# Patient Record
Sex: Male | Born: 1965 | Race: White | Hispanic: No | Marital: Married | Smoking: Never smoker
Health system: Southern US, Community
[De-identification: ages and names within clinical notes are randomized; demographics above are authoritative.]

## PROBLEM LIST (undated history)

## (undated) DIAGNOSIS — J40 Bronchitis, not specified as acute or chronic: Secondary | ICD-10-CM

## (undated) DIAGNOSIS — C801 Malignant (primary) neoplasm, unspecified: Secondary | ICD-10-CM

## (undated) DIAGNOSIS — K449 Diaphragmatic hernia without obstruction or gangrene: Secondary | ICD-10-CM

## (undated) DIAGNOSIS — D214 Benign neoplasm of connective and other soft tissue of abdomen: Secondary | ICD-10-CM

## (undated) HISTORY — DX: Diaphragmatic hernia without obstruction or gangrene: K44.9

## (undated) HISTORY — DX: Benign neoplasm of connective and other soft tissue of abdomen: D21.4

## (undated) HISTORY — PX: SHOULDER SURGERY: SHX246

## (undated) HISTORY — PX: WISDOM TOOTH EXTRACTION: SHX21

---

## 2004-11-16 ENCOUNTER — Ambulatory Visit: Payer: Self-pay | Admitting: Internal Medicine

## 2004-11-22 ENCOUNTER — Ambulatory Visit (HOSPITAL_COMMUNITY): Admission: RE | Admit: 2004-11-22 | Discharge: 2004-11-22 | Payer: Self-pay | Admitting: Internal Medicine

## 2004-11-24 ENCOUNTER — Ambulatory Visit: Payer: Self-pay | Admitting: Internal Medicine

## 2005-02-06 ENCOUNTER — Ambulatory Visit: Payer: Self-pay | Admitting: Gastroenterology

## 2005-02-07 ENCOUNTER — Ambulatory Visit: Payer: Self-pay | Admitting: Gastroenterology

## 2005-02-07 ENCOUNTER — Encounter (INDEPENDENT_AMBULATORY_CARE_PROVIDER_SITE_OTHER): Payer: Self-pay | Admitting: Specialist

## 2005-03-07 ENCOUNTER — Ambulatory Visit (HOSPITAL_COMMUNITY): Admission: RE | Admit: 2005-03-07 | Discharge: 2005-03-07 | Payer: Self-pay | Admitting: Gastroenterology

## 2005-03-07 ENCOUNTER — Ambulatory Visit: Payer: Self-pay | Admitting: Gastroenterology

## 2005-03-07 ENCOUNTER — Encounter (INDEPENDENT_AMBULATORY_CARE_PROVIDER_SITE_OTHER): Payer: Self-pay | Admitting: Specialist

## 2008-07-14 ENCOUNTER — Telehealth (INDEPENDENT_AMBULATORY_CARE_PROVIDER_SITE_OTHER): Payer: Self-pay | Admitting: *Deleted

## 2008-07-22 ENCOUNTER — Ambulatory Visit: Payer: Self-pay | Admitting: Internal Medicine

## 2008-07-22 DIAGNOSIS — M79609 Pain in unspecified limb: Secondary | ICD-10-CM

## 2008-07-22 DIAGNOSIS — M25519 Pain in unspecified shoulder: Secondary | ICD-10-CM | POA: Insufficient documentation

## 2008-07-22 DIAGNOSIS — R21 Rash and other nonspecific skin eruption: Secondary | ICD-10-CM

## 2008-07-22 DIAGNOSIS — D485 Neoplasm of uncertain behavior of skin: Secondary | ICD-10-CM

## 2008-07-27 ENCOUNTER — Encounter (INDEPENDENT_AMBULATORY_CARE_PROVIDER_SITE_OTHER): Payer: Self-pay | Admitting: *Deleted

## 2008-08-17 ENCOUNTER — Ambulatory Visit: Payer: Self-pay | Admitting: Internal Medicine

## 2008-08-17 ENCOUNTER — Encounter: Payer: Self-pay | Admitting: Internal Medicine

## 2008-10-22 ENCOUNTER — Ambulatory Visit: Payer: Self-pay | Admitting: Internal Medicine

## 2009-10-17 ENCOUNTER — Encounter: Payer: Self-pay | Admitting: Internal Medicine

## 2009-11-17 ENCOUNTER — Emergency Department (HOSPITAL_COMMUNITY): Admission: EM | Admit: 2009-11-17 | Discharge: 2009-11-18 | Payer: Self-pay | Admitting: Emergency Medicine

## 2010-03-06 ENCOUNTER — Ambulatory Visit: Payer: Self-pay | Admitting: Internal Medicine

## 2010-03-06 LAB — CONVERTED CEMR LAB
Albumin: 4.2 g/dL (ref 3.5–5.2)
BUN: 13 mg/dL (ref 6–23)
Basophils Relative: 0.3 % (ref 0.0–3.0)
Bilirubin, Direct: 0.1 mg/dL (ref 0.0–0.3)
CO2: 29 meq/L (ref 19–32)
Calcium: 9.3 mg/dL (ref 8.4–10.5)
Eosinophils Absolute: 0.1 10*3/uL (ref 0.0–0.7)
GFR calc non Af Amer: 90.1 mL/min (ref >60)
Glucose, Bld: 82 mg/dL (ref 70–99)
HCT: 43.2 % (ref 39.0–52.0)
HDL: 47.1 mg/dL (ref >39.00)
Ketones, ur: NEGATIVE mg/dL
MCHC: 35.6 g/dL (ref 30.0–36.0)
MCV: 93.2 fL (ref 78.0–100.0)
Monocytes Absolute: 0.6 10*3/uL (ref 0.1–1.0)
Neutrophils Relative %: 60.6 % (ref 43.0–77.0)
Nitrite: NEGATIVE
RBC: 4.63 M/uL (ref 4.22–5.81)
RDW: 13.9 % (ref 11.5–14.6)
Sodium: 137 meq/L (ref 135–145)
Specific Gravity, Urine: 1.01 (ref 1.000–1.030)
TSH: 3.45 microintl units/mL (ref 0.35–5.50)
Total Bilirubin: 0.7 mg/dL (ref 0.3–1.2)
Total CHOL/HDL Ratio: 4
Total Protein: 6.9 g/dL (ref 6.0–8.3)
Urine Glucose: NEGATIVE mg/dL
Urobilinogen, UA: 0.2 (ref 0.0–1.0)

## 2010-03-15 ENCOUNTER — Encounter: Payer: Self-pay | Admitting: Internal Medicine

## 2010-03-15 ENCOUNTER — Ambulatory Visit: Payer: Self-pay | Admitting: Internal Medicine

## 2010-03-15 DIAGNOSIS — R972 Elevated prostate specific antigen [PSA]: Secondary | ICD-10-CM

## 2010-04-30 LAB — CONVERTED CEMR LAB
ALT: 18 units/L (ref 0–53)
AST: 23 units/L (ref 0–37)
Alkaline Phosphatase: 62 units/L (ref 39–117)
Basophils Relative: 0.6 % (ref 0.0–3.0)
Bilirubin Urine: NEGATIVE
Bilirubin, Direct: 0.2 mg/dL (ref 0.0–0.3)
Eosinophils Absolute: 0.1 10*3/uL (ref 0.0–0.7)
Eosinophils Relative: 1.1 % (ref 0.0–5.0)
GFR calc non Af Amer: 87.68 mL/min (ref >60)
Glucose, Bld: 102 mg/dL — ABNORMAL HIGH (ref 70–99)
HDL: 36.3 mg/dL — ABNORMAL LOW (ref >39.00)
Hemoglobin, Urine: NEGATIVE
Lymphs Abs: 1.9 10*3/uL (ref 0.7–4.0)
MCHC: 34.6 g/dL (ref 30.0–36.0)
MCV: 89.8 fL (ref 78.0–100.0)
Monocytes Relative: 10.3 % (ref 3.0–12.0)
Neutro Abs: 3.3 10*3/uL (ref 1.4–7.7)
Neutrophils Relative %: 56.1 % (ref 43.0–77.0)
Potassium: 5.1 meq/L (ref 3.5–5.1)
RBC: 5.09 M/uL (ref 4.22–5.81)
Sodium: 142 meq/L (ref 135–145)
Specific Gravity, Urine: 1.02 (ref 1.000–1.030)
Total Bilirubin: 0.9 mg/dL (ref 0.3–1.2)
Total Protein, Urine: NEGATIVE mg/dL
Triglycerides: 110 mg/dL (ref 0.0–149.0)
Urine Glucose: NEGATIVE mg/dL

## 2010-05-02 NOTE — Letter (Signed)
Summary: Primary Care Appointment Letter  [facility]  [address] [place], [place] [address]   Phone: [phone]  Fax: [phone]    10/17/2009 MRN: [id number]  Sharrieff Ortner [address] [place], [place]  [address]  Dear Mr. Kyung Rudd,   Your Primary Care Physician Tresa Garter MD has indicated that:    ___X____it is time to schedule an appointment for your physical with Dr. Posey Rea. Please call the office to schedule.    _______you missed your appointment on______ and need to call and          reschedule.    _______you need to have lab work done.    _______you need to schedule an appointment discuss lab or test results.    _______you need to call to reschedule your appointment that is                       scheduled on _________.     Please call our office as soon as possible. Our phone number is [phone]. Please press option 1. Our office is open 8a-12noon and 1p-5p, Monday through Friday.     Thank you,    [facility]

## 2010-05-04 NOTE — Assessment & Plan Note (Signed)
Summary: Cpx/Bcbs/#/cd   Vital Signs:  Patient profile:   45 year old male Height:      72 inches Weight:      186 pounds BMI:     25.32 Temp:     98.7 degrees F oral Pulse rate:   72 / minute Pulse rhythm:   regular Resp:     16 per minute BP sitting:   110 / 80  (left arm) Cuff size:   regular  Vitals Entered By: Lanier Prude, Beverly Gust) (March 15, 2010 2:18 PM) CC: CPX Is Patient Diabetic? No Comments pt is not using Topicort or Ibuprofen   CC:  CPX.  History of Present Illness: The patient presents for a preventive health examination   Current Medications (verified): 1)  Vitamin D3 1000 Unit  Tabs (Cholecalciferol) .Marland Kitchen.. 1 By Mouth Daily 2)  Topicort 0.25 % Crea (Desoximetasone) .... Use Tid 3)  Ibuprofen 600 Mg  Tabs (Ibuprofen) .Marland Kitchen.. 1 By Mouth Two Times A Day Pc As Needed Pain 4)  Aleve 220 Mg Tabs (Naproxen Sodium) .... As Needed  Allergies (verified): No Known Drug Allergies  Past History:  Family History: Last updated: 07/22/2008 F prostate ca  Past Medical History: R shoulder pain - resolved post surgery  Past Surgical History: R shoulder surgery 2010 Dr Rennis Chris  Social History: Occupation: Advice worker Married (separated) 2 daughters 74 and 21 Never Smoked Alcohol use-yes Regular exercise-yes  Review of Systems  The patient denies anorexia, fever, weight loss, weight gain, vision loss, decreased hearing, hoarseness, chest pain, syncope, dyspnea on exertion, peripheral edema, prolonged cough, headaches, hemoptysis, abdominal pain, melena, hematochezia, severe indigestion/heartburn, hematuria, incontinence, genital sores, muscle weakness, suspicious skin lesions, transient blindness, difficulty walking, depression, unusual weight change, abnormal bleeding, enlarged lymph nodes, angioedema, and breast masses.    Physical Exam  General:  Well-developed,well-nourished,in no acute distress; alert,appropriate and cooperative throughout  examination Head:  Normocephalic and atraumatic without obvious abnormalities. No apparent alopecia or balding. Eyes:  No corneal or conjunctival inflammation noted. EOMI. Perrla. Funduscopic exam benign, without hemorrhages, exudates or papilledema. Vision grossly normal. Ears:  External ear exam shows no significant lesions or deformities.  Otoscopic examination reveals clear canals, tympanic membranes are intact bilaterally without bulging, retraction, inflammation or discharge. Hearing is grossly normal bilaterally. Nose:  External nasal examination shows no deformity or inflammation. Nasal mucosa are pink and moist without lesions or exudates. Mouth:  Oral mucosa and oropharynx without lesions or exudates.  Teeth in good repair. Neck:  No deformities, masses, or tenderness noted. Lungs:  Normal respiratory effort, chest expands symmetrically. Lungs are clear to auscultation, no crackles or wheezes. Heart:  Normal rate and regular rhythm. S1 and S2 normal without gallop, murmur, click, rub or other extra sounds. Abdomen:  Bowel sounds positive,abdomen soft and non-tender without masses, organomegaly or hernias noted. Genitalia:  Testes bilaterally descended without nodularity, tenderness or masses. No scrotal masses or lesions. No penis lesions or urethral discharge. Msk:  No deformity or scoliosis noted of thoracic or lumbar spine.   Pulses:  R and L carotid,radial,femoral,dorsalis pedis and posterior tibial pulses are full and equal bilaterally Extremities:  No clubbing, cyanosis, edema, or deformity noted with normal full range of motion of all joints.   Neurologic:  No cranial nerve deficits noted. Station and gait are normal. Plantar reflexes are down-going bilaterally. DTRs are symmetrical throughout. Sensory, motor and coordinative functions appear intact. Skin:  Intact without suspicious lesions or rashes Cervical Nodes:  No lymphadenopathy noted Inguinal  Nodes:  No significant  adenopathy Psych:  Cognition and judgment appear intact. Alert and cooperative with normal attention span and concentration. No apparent delusions, illusions, hallucinations   Impression & Recommendations:  Problem # 1:  WELL ADULT EXAM (ICD-V70.0) Assessment New Health and age related issues were discussed. Available screening tests and vaccinations were discussed as well. Healthy life style including good diet and exercise was discussed.  The labs were reviewed with the patient.  Orders: EKG w/ Interpretation (93000)  Problem # 2:  PSA, INCREASED (ICD-790.93) Assessment: New See "Patient Instructions".   Problem # 3:  SKIN RASH (ICD-782.1) Assessment: Comment Only  His updated medication list for this problem includes:    Topicort 0.25 % Crea (Desoximetasone) ..... Use tid  Complete Medication List: 1)  Vitamin D3 1000 Unit Tabs (Cholecalciferol) .Marland Kitchen.. 1 by mouth daily 2)  Topicort 0.25 % Crea (Desoximetasone) .... Use tid 3)  Ibuprofen 600 Mg Tabs (Ibuprofen) .Marland Kitchen.. 1 by mouth two times a day pc as needed pain 4)  Aleve 220 Mg Tabs (Naproxen sodium) .... As needed  Other Orders: Tdap => 46yrs IM (09323) Admin 1st Vaccine (55732)  Patient Instructions: 1)  Please schedule a follow-up appointment in 1 year 2)  PSA and free PSA 790.93   Orders Added: 1)  EKG w/ Interpretation [93000] 2)  Tdap => 49yrs IM [90715] 3)  Admin 1st Vaccine [90471] 4)  Est. Patient 40-64 years [20254]   Immunizations Administered:  Tetanus Vaccine:    Vaccine Type: Tdap    Site: left deltoid    Mfr: GlaxoSmithKline    Dose: 0.5 ml    Route: IM    Given by: Ami Bullins CMA    Exp. Date: 01/20/2012    Lot #: [id number]    VIS given: 02/18/08 version given March 15, 2010.   Immunizations Administered:  Tetanus Vaccine:    Vaccine Type: Tdap    Site: left deltoid    Mfr: GlaxoSmithKline    Dose: 0.5 ml    Route: IM    Given by: Ami Bullins CMA    Exp. Date: 01/20/2012    Lot #:  [id number]    VIS given: 02/18/08 version given March 15, 2010.

## 2010-06-16 LAB — DIFFERENTIAL
Lymphocytes Relative: 23 % (ref 12–46)
Lymphs Abs: 2.5 10*3/uL (ref 0.7–4.0)
Monocytes Relative: 10 % (ref 3–12)
Neutro Abs: 7.1 10*3/uL (ref 1.7–7.7)
Neutrophils Relative %: 67 % (ref 43–77)

## 2010-06-16 LAB — POCT I-STAT, CHEM 8
BUN: 14 mg/dL (ref 6–23)
Calcium, Ion: 1.14 mmol/L (ref 1.12–1.32)
Chloride: 100 mEq/L (ref 96–112)
Creatinine, Ser: 1.1 mg/dL (ref 0.4–1.5)
Hemoglobin: 15.6 g/dL (ref 13.0–17.0)
Potassium: 3.6 mEq/L (ref 3.5–5.1)

## 2010-06-16 LAB — CBC
MCH: 32.3 pg (ref 26.0–34.0)
MCHC: 34.9 g/dL (ref 30.0–36.0)
MCV: 92.5 fL (ref 78.0–100.0)
Platelets: 221 10*3/uL (ref 150–400)

## 2010-06-16 LAB — POCT CARDIAC MARKERS
CKMB, poc: <1 ng/mL — ABNORMAL LOW (ref 1.0–8.0)
Troponin i, poc: <0.05 ng/mL (ref 0.00–0.09)

## 2010-08-21 ENCOUNTER — Telehealth: Payer: Self-pay | Admitting: Gastroenterology

## 2010-08-21 ENCOUNTER — Encounter: Payer: Self-pay | Admitting: Gastroenterology

## 2010-08-21 NOTE — Telephone Encounter (Signed)
Pt was told he would need to make an appt to see Dr Russella Dar to discuss.  He was transferred to the schedulers to schedule

## 2010-09-11 ENCOUNTER — Other Ambulatory Visit: Payer: Self-pay

## 2010-09-11 ENCOUNTER — Other Ambulatory Visit: Payer: Self-pay | Admitting: Internal Medicine

## 2010-09-11 DIAGNOSIS — R972 Elevated prostate specific antigen [PSA]: Secondary | ICD-10-CM

## 2010-09-20 ENCOUNTER — Encounter: Payer: Self-pay | Admitting: Gastroenterology

## 2010-09-27 ENCOUNTER — Ambulatory Visit: Payer: Self-pay | Admitting: Gastroenterology

## 2010-11-06 ENCOUNTER — Encounter: Payer: Self-pay | Admitting: Gastroenterology

## 2010-11-07 ENCOUNTER — Encounter: Payer: Self-pay | Admitting: Gastroenterology

## 2010-11-07 ENCOUNTER — Ambulatory Visit (INDEPENDENT_AMBULATORY_CARE_PROVIDER_SITE_OTHER): Payer: PRIVATE HEALTH INSURANCE | Admitting: Gastroenterology

## 2010-11-07 VITALS — BP 110/80 | HR 60 | Ht 72.0 in | Wt 180.8 lb

## 2010-11-07 DIAGNOSIS — R933 Abnormal findings on diagnostic imaging of other parts of digestive tract: Secondary | ICD-10-CM

## 2010-11-07 NOTE — Patient Instructions (Signed)
Needs upper EUS, 60 min, radial +/- linear, next available [facility] time, +propofol (needed a lot of meds at time of last EUS). A copy of this information will be made available to Dr. Posey Rea.

## 2010-11-07 NOTE — Progress Notes (Signed)
Review of pertinent gastrointestinal problems: 1. 2.5cm by 1.1cm subepith lesion in gastric cardia; EUS 12.2006 after incidentally noted by Dr. Russella Dar one month prior.  FNA showed spindle cells, was recommended for repeat EUS at 12 month interval.   HPI: This is a  very pleasant 45 year old man whom I last saw in 2006. He was recommended to have a repeat endoscopic ultrasound at 12 month interval but he never got around to scheduling that are calling us back.  Has been well since 2006, no serious new diagnoses.  No dysphagia.  Overall a small amount of weight loss, not really intentional.  Some new stresses.  Separating with wife.  No post prandial problems.  No GERD smptoms   Review of systems: Pertinent positive and negative review of systems were noted in the above HPI section.  All other review of systems was otherwise negative.   Past Medical History  Diagnosis Date  . Hiatal hernia   . GIST (gastrointestinal stromal tumor), non-malignant     Past Surgical History  Procedure Date  . Shoulder surgery      reports that he has never smoked. He has never used smokeless tobacco. He reports that he drinks alcohol. He reports that he does not use illicit drugs.  family history includes Colon cancer in his paternal grandfather and Prostate cancer in an unspecified family member.    Current Medications, Allergies were all reviewed with the patient via [facility] electronic medical record system.    Physical Exam: BP 110/80  Pulse 60  Ht 6' (1.829 m)  Wt 180 lb 12.8 oz (82.01 kg)  BMI 24.52 kg/m2 Constitutional: generally well-appearing Psychiatric: alert and oriented x3 Eyes: extraocular movements intact Mouth: oral pharynx moist, no lesions Neck: supple no lymphadenopathy Cardiovascular: heart regular rate and rhythm Lungs: clear to auscultation bilaterally Abdomen: soft, nontender, nondistended, no obvious ascites, no peritoneal signs, normal bowel  sounds Extremities: no lower extremity edema bilaterally Skin: no lesions on visible extremities    Assessment and plan: 45 y.o. male with 2006 sub-mucosal lesion in proximal stomach  We will set up repeat endoscopic ultrasound with propofol sedation to reevaluate the submucosal lesion in his proximal stomach. It has been about 5 years since it was last Evaluated. If there is been no significant changes in size then I do not think it should be followed any further.

## 2010-11-20 ENCOUNTER — Telehealth: Payer: Self-pay

## 2010-11-20 NOTE — Telephone Encounter (Signed)
Pt aware of the date change and has been reinstructed

## 2010-12-28 ENCOUNTER — Encounter: Payer: PRIVATE HEALTH INSURANCE | Admitting: Gastroenterology

## 2010-12-28 ENCOUNTER — Telehealth: Payer: Self-pay

## 2010-12-28 ENCOUNTER — Ambulatory Visit (HOSPITAL_COMMUNITY)
Admission: RE | Admit: 2010-12-28 | Discharge: 2010-12-28 | Disposition: A | Discharge status: Home / Self Care | Payer: PRIVATE HEALTH INSURANCE | Source: Ambulatory Visit | Attending: Gastroenterology | Admitting: Gastroenterology

## 2010-12-28 DIAGNOSIS — K319 Disease of stomach and duodenum, unspecified: Secondary | ICD-10-CM | POA: Insufficient documentation

## 2010-12-28 DIAGNOSIS — R933 Abnormal findings on diagnostic imaging of other parts of digestive tract: Secondary | ICD-10-CM

## 2010-12-28 NOTE — Telephone Encounter (Signed)
Message copied by Donata Duff on Thu Dec 28, 2010  9:48 AM ------      Message from: Rob Bunting P      Created: Thu Dec 28, 2010  9:40 AM       He needs repeat upper EUS in 3 years,            Thanks

## 2010-12-28 NOTE — Telephone Encounter (Signed)
Recall in EPIC 

## 2012-07-04 ENCOUNTER — Ambulatory Visit (INDEPENDENT_AMBULATORY_CARE_PROVIDER_SITE_OTHER): Payer: PRIVATE HEALTH INSURANCE | Admitting: Internal Medicine

## 2012-07-04 ENCOUNTER — Other Ambulatory Visit (INDEPENDENT_AMBULATORY_CARE_PROVIDER_SITE_OTHER): Payer: PRIVATE HEALTH INSURANCE

## 2012-07-04 ENCOUNTER — Encounter: Payer: Self-pay | Admitting: Internal Medicine

## 2012-07-04 VITALS — BP 106/70 | HR 72 | Temp 98.2°F | Resp 16 | Ht 72.0 in | Wt 192.0 lb

## 2012-07-04 DIAGNOSIS — R221 Localized swelling, mass and lump, neck: Secondary | ICD-10-CM

## 2012-07-04 DIAGNOSIS — R972 Elevated prostate specific antigen [PSA]: Secondary | ICD-10-CM

## 2012-07-04 DIAGNOSIS — Z0000 Encounter for general adult medical examination without abnormal findings: Secondary | ICD-10-CM

## 2012-07-04 LAB — CBC WITH DIFFERENTIAL/PLATELET
Basophils Relative: 0.1 % (ref 0.0–3.0)
Eosinophils Absolute: 0.1 10*3/uL (ref 0.0–0.7)
Eosinophils Relative: 1.2 % (ref 0.0–5.0)
Hemoglobin: 14.7 g/dL (ref 13.0–17.0)
Lymphocytes Relative: 36.8 % (ref 12.0–46.0)
Monocytes Absolute: 0.5 10*3/uL (ref 0.1–1.0)
Monocytes Relative: 10.3 % (ref 3.0–12.0)
RBC: 4.66 Mil/uL (ref 4.22–5.81)
RDW: 12 % (ref 11.5–14.6)
WBC: 5.1 10*3/uL (ref 4.5–10.5)

## 2012-07-04 LAB — URINALYSIS
Bilirubin Urine: NEGATIVE
Hgb urine dipstick: NEGATIVE
Leukocytes, UA: NEGATIVE
Nitrite: NEGATIVE
Specific Gravity, Urine: <=1.005 (ref 1.000–1.030)

## 2012-07-04 LAB — BASIC METABOLIC PANEL
Calcium: 8.8 mg/dL (ref 8.4–10.5)
Glucose, Bld: 87 mg/dL (ref 70–99)
Sodium: 136 mEq/L (ref 135–145)

## 2012-07-04 LAB — HEPATIC FUNCTION PANEL: Total Bilirubin: 0.4 mg/dL (ref 0.3–1.2)

## 2012-07-04 NOTE — Progress Notes (Signed)
  Subjective:    Patient ID: Aaron Sharp, male    DOB: December 31, 1965, 47 y.o.   MRN: [id number]  HPI  The patient is here for a wellness exam. The patient has been doing well overall without major physical or psychological issues going on lately. C/o R shoulder pain and a lump on L neck x 3 years  Review of Systems  Constitutional: Negative for appetite change, fatigue and unexpected weight change.  HENT: Negative for nosebleeds, congestion, sore throat, sneezing, trouble swallowing and neck pain.   Eyes: Negative for itching and visual disturbance.  Respiratory: Negative for cough.   Cardiovascular: Negative for chest pain, palpitations and leg swelling.  Gastrointestinal: Negative for nausea, diarrhea, blood in stool and abdominal distention.  Genitourinary: Negative for frequency and hematuria.  Musculoskeletal: Negative for back pain, joint swelling and gait problem.  Skin: Negative for rash.  Neurological: Negative for dizziness, tremors, speech difficulty and weakness.  Psychiatric/Behavioral: Negative for sleep disturbance, dysphoric mood and agitation. The patient is not nervous/anxious.        Objective:   Physical Exam  Constitutional: He is oriented to person, place, and time. He appears well-developed.  HENT:  Mouth/Throat: Oropharynx is clear and moist.  Eyes: Conjunctivae are normal. Pupils are equal, round, and reactive to light.  Neck: Normal range of motion. No JVD present. No thyromegaly present.  Cardiovascular: Normal rate, regular rhythm, normal heart sounds and intact distal pulses.  Exam reveals no gallop and no friction rub.   No murmur heard. Pulmonary/Chest: Effort normal and breath sounds normal. No respiratory distress. He has no wheezes. He has no rales. He exhibits no tenderness.  Abdominal: Soft. Bowel sounds are normal. He exhibits no distension and no mass. There is no tenderness. There is no rebound and no guarding.  Musculoskeletal: Normal range of  motion. He exhibits tenderness (B shoulders NT; R trap w/a couple tender spots). He exhibits no edema.  Lymphadenopathy:    He has no cervical adenopathy.  Neurological: He is alert and oriented to person, place, and time. He has normal reflexes. No cranial nerve deficit. He exhibits normal muscle tone. Coordination normal.  Skin: Skin is warm and dry. No rash noted.  L lat neck w/oval flat lump over mid SKM 1.7x1.2 cm NT  Psychiatric: He has a normal mood and affect. His behavior is normal. Judgment and thought content normal.     Procedure Note :    Procedure :   Point of care (POC) sonography examination   Indication: L neck mass   Equipment used: Sonosite M-Turbo with HFL38x/13-6 MHz transducer linear probe. The images were stored in the unit and later transferred in storage.  The patient was placed in a decubitus position.  This study revealed no abnormal findings  Impression: no abnormal findings        Assessment & Plan:

## 2012-07-04 NOTE — Assessment & Plan Note (Signed)
Declined PSA 

## 2012-07-04 NOTE — Assessment & Plan Note (Signed)
POC [place] CT

## 2012-07-05 NOTE — Assessment & Plan Note (Signed)
We discussed age appropriate health related issues, including available/recomended screening tests and vaccinations. We discussed a need for adhering to healthy diet and exercise. Labs/EKG were reviewed/ordered. All questions were answered.   

## 2012-07-09 ENCOUNTER — Ambulatory Visit (INDEPENDENT_AMBULATORY_CARE_PROVIDER_SITE_OTHER)
Admission: RE | Admit: 2012-07-09 | Discharge: 2012-07-09 | Disposition: A | Discharge status: Home / Self Care | Payer: PRIVATE HEALTH INSURANCE | Source: Ambulatory Visit | Attending: Internal Medicine | Admitting: Internal Medicine

## 2012-07-09 DIAGNOSIS — R221 Localized swelling, mass and lump, neck: Secondary | ICD-10-CM

## 2012-07-09 DIAGNOSIS — R220 Localized swelling, mass and lump, head: Secondary | ICD-10-CM

## 2012-07-09 MED ORDER — IOHEXOL 300 MG/ML  SOLN
75.0000 mL | Freq: Once | INTRAMUSCULAR | Status: AC | PRN
  Administered 2012-07-09: 75 mL via INTRAVENOUS

## 2012-08-19 ENCOUNTER — Encounter: Payer: Self-pay | Admitting: Internal Medicine

## 2013-02-05 ENCOUNTER — Other Ambulatory Visit: Payer: Self-pay

## 2013-07-10 ENCOUNTER — Encounter: Payer: PRIVATE HEALTH INSURANCE | Admitting: Internal Medicine

## 2013-07-27 ENCOUNTER — Ambulatory Visit (INDEPENDENT_AMBULATORY_CARE_PROVIDER_SITE_OTHER): Payer: PRIVATE HEALTH INSURANCE | Admitting: Internal Medicine

## 2013-07-27 ENCOUNTER — Encounter: Payer: Self-pay | Admitting: Internal Medicine

## 2013-07-27 VITALS — BP 100/66 | HR 65 | Temp 98.3°F | Resp 16 | Ht 72.0 in | Wt 189.4 lb

## 2013-07-27 DIAGNOSIS — Z136 Encounter for screening for cardiovascular disorders: Secondary | ICD-10-CM

## 2013-07-27 DIAGNOSIS — M79609 Pain in unspecified limb: Secondary | ICD-10-CM

## 2013-07-27 DIAGNOSIS — M25519 Pain in unspecified shoulder: Secondary | ICD-10-CM

## 2013-07-27 DIAGNOSIS — Z0000 Encounter for general adult medical examination without abnormal findings: Secondary | ICD-10-CM

## 2013-07-27 MED ORDER — VITAMIN D 1000 UNITS PO TABS: 1000.0000 [IU] | ORAL_TABLET | Freq: Every day | ORAL | Status: AC

## 2013-07-27 MED ORDER — METHYLPREDNISOLONE ACETATE 40 MG/ML IJ SUSP
40.0000 mg | Freq: Once | INTRAMUSCULAR | Status: AC
  Administered 2013-07-27: 40 mg via INTRA_ARTICULAR

## 2013-07-27 MED ORDER — METHYLPREDNISOLONE ACETATE 40 MG/ML IJ SUSP: 40.0000 mg | Freq: Once | INTRAMUSCULAR | Status: DC

## 2013-07-27 NOTE — Progress Notes (Signed)
Subjective:    HPI  The patient is here for a wellness exam. The patient has been doing well overall without major physical or psychological issues going on lately C/o ongoing R shoulder pain  Wt Readings from Last 3 Encounters:  07/27/13 189 lb 6 oz (85.9 kg)  07/04/12 192 lb (87.091 kg)  11/07/10 180 lb 12.8 oz (82.01 kg)   BP Readings from Last 3 Encounters:  07/27/13 100/66  07/04/12 106/70  11/07/10 110/80       .Review of Systems  Constitutional: Negative for appetite change, fatigue and unexpected weight change.  HENT: Negative for congestion, nosebleeds, sneezing, sore throat and trouble swallowing.   Eyes: Negative for itching and visual disturbance.  Respiratory: Negative for cough.   Cardiovascular: Negative for chest pain, palpitations and leg swelling.  Gastrointestinal: Negative for nausea, diarrhea, blood in stool and abdominal distention.  Genitourinary: Negative for frequency and hematuria.  Musculoskeletal: Negative for back pain, gait problem, joint swelling and neck pain.  Skin: Negative for rash.  Neurological: Negative for dizziness, tremors, speech difficulty and weakness.  Psychiatric/Behavioral: Negative for sleep disturbance, dysphoric mood and agitation. The patient is not nervous/anxious.        Objective:   Physical Exam  Constitutional: He is oriented to person, place, and time. He appears well-developed.  HENT:  Mouth/Throat: Oropharynx is clear and moist.  Eyes: Conjunctivae are normal. Pupils are equal, round, and reactive to light.  Neck: Normal range of motion. No JVD present. No thyromegaly present.  Cardiovascular: Normal rate, regular rhythm, normal heart sounds and intact distal pulses.  Exam reveals no gallop and no friction rub.   No murmur heard. Pulmonary/Chest: Effort normal and breath sounds normal. No respiratory distress. He has no wheezes. He has no rales. He exhibits no tenderness.  Abdominal: Soft. Bowel sounds are  normal. He exhibits no distension and no mass. There is no tenderness. There is no rebound and no guarding.  Musculoskeletal: Normal range of motion. He exhibits tenderness (B shoulders NT; R trap w/a 3 tender spots posteriorly). He exhibits no edema.  Lymphadenopathy:    He has no cervical adenopathy.  Neurological: He is alert and oriented to person, place, and time. He has normal reflexes. No cranial nerve deficit. He exhibits normal muscle tone. Coordination normal.  Skin: Skin is warm and dry. No rash noted.  L lat neck w/oval flat lump over mid SKM 1.7x1.2 cm NT  Psychiatric: He has a normal mood and affect. His behavior is normal. Judgment and thought content normal.   Lab Results  Component Value Date   WBC 5.1 07/04/2012   HGB 14.7 07/04/2012   HCT 42.2 07/04/2012   PLT 162.0 07/04/2012   GLUCOSE 87 07/04/2012   CHOL 183 03/06/2010   TRIG 74.0 03/06/2010   HDL 47.10 03/06/2010   LDLCALC 121* 03/06/2010   ALT 24 07/04/2012   AST 25 07/04/2012   NA 136 07/04/2012   K 4.1 07/04/2012   CL 103 07/04/2012   CREATININE 1.0 07/04/2012   BUN 11 07/04/2012   CO2 27 07/04/2012   TSH 4.48 07/04/2012   PSA 2.91 03/06/2010   INR 1.09 11/17/2009    Procedure Note :    Trigger Point Injection:   Indication : Focal tender area identifiable by the location without other identifiable neurologic or musculoskeletal finding or pathology.   Risks including unsuccessful procedure , bleeding, infection, bruising, skin atrophy and others were explained to the patient in detail as well as  the benefits. Informed consent was obtained and signed.   Tthe patient was placed in a comfortable position. 4   points of maximum tenderness over posterior R shoulder muscles and R trapezius muscles were marked and  the skin was prepped with Betadine and alcohol. 1 inch 25-gauge needle was used. The needle was advanced perpendicular to the skin. Each trigger point was injected with 1 mL of 2% lidocaine and 10 mg of Depo-Medrol in a usual  fashion.  Band-Aids applied.   Tolerated well. Complications: None. Good pain relief following the procedure.      Assessment & Plan:

## 2013-07-27 NOTE — Progress Notes (Signed)
Pre visit review using our clinic review tool, if applicable. No additional management support is needed unless otherwise documented below in the visit note/SLS  

## 2013-07-27 NOTE — Assessment & Plan Note (Signed)
We discussed age appropriate health related issues, including available/recomended screening tests and vaccinations. We discussed a need for adhering to healthy diet and exercise. Labs/EKG were reviewed/ordered. All questions were answered.   

## 2013-08-04 ENCOUNTER — Other Ambulatory Visit: Payer: Self-pay | Admitting: Internal Medicine

## 2013-08-04 ENCOUNTER — Ambulatory Visit (INDEPENDENT_AMBULATORY_CARE_PROVIDER_SITE_OTHER): Payer: PRIVATE HEALTH INSURANCE | Admitting: Family Medicine

## 2013-08-04 ENCOUNTER — Other Ambulatory Visit (INDEPENDENT_AMBULATORY_CARE_PROVIDER_SITE_OTHER): Payer: PRIVATE HEALTH INSURANCE

## 2013-08-04 ENCOUNTER — Encounter: Payer: Self-pay | Admitting: Family Medicine

## 2013-08-04 VITALS — BP 120/82 | HR 55 | Wt 187.0 lb

## 2013-08-04 DIAGNOSIS — Z0000 Encounter for general adult medical examination without abnormal findings: Secondary | ICD-10-CM

## 2013-08-04 DIAGNOSIS — M24111 Other articular cartilage disorders, right shoulder: Secondary | ICD-10-CM | POA: Insufficient documentation

## 2013-08-04 DIAGNOSIS — S43432A Superior glenoid labrum lesion of left shoulder, initial encounter: Secondary | ICD-10-CM

## 2013-08-04 DIAGNOSIS — M25519 Pain in unspecified shoulder: Secondary | ICD-10-CM

## 2013-08-04 DIAGNOSIS — M25511 Pain in right shoulder: Secondary | ICD-10-CM

## 2013-08-04 DIAGNOSIS — S43439A Superior glenoid labrum lesion of unspecified shoulder, initial encounter: Secondary | ICD-10-CM

## 2013-08-04 DIAGNOSIS — R8290 Unspecified abnormal findings in urine: Secondary | ICD-10-CM

## 2013-08-04 LAB — HEPATIC FUNCTION PANEL
ALBUMIN: 4.3 g/dL (ref 3.5–5.2)
ALK PHOS: 55 U/L (ref 39–117)
ALT: 21 U/L (ref 0–53)
AST: 21 U/L (ref 0–37)
BILIRUBIN DIRECT: 0.1 mg/dL (ref 0.0–0.3)
TOTAL PROTEIN: 7.1 g/dL (ref 6.0–8.3)
Total Bilirubin: 0.6 mg/dL (ref 0.2–1.2)

## 2013-08-04 LAB — CBC WITH DIFFERENTIAL/PLATELET
BASOS ABS: 0 10*3/uL (ref 0.0–0.1)
Basophils Relative: 0.4 % (ref 0.0–3.0)
EOS PCT: 1.1 % (ref 0.0–5.0)
Eosinophils Absolute: 0.1 10*3/uL (ref 0.0–0.7)
HCT: 44.7 % (ref 39.0–52.0)
Hemoglobin: 15.4 g/dL (ref 13.0–17.0)
Lymphocytes Relative: 29.2 % (ref 12.0–46.0)
Lymphs Abs: 1.6 10*3/uL (ref 0.7–4.0)
MCHC: 34.4 g/dL (ref 30.0–36.0)
MCV: 93.2 fl (ref 78.0–100.0)
MONO ABS: 0.5 10*3/uL (ref 0.1–1.0)
Monocytes Relative: 8.5 % (ref 3.0–12.0)
NEUTROS PCT: 60.8 % (ref 43.0–77.0)
Neutro Abs: 3.3 10*3/uL (ref 1.4–7.7)
Platelets: 216 10*3/uL (ref 150.0–400.0)
RBC: 4.8 Mil/uL (ref 4.22–5.81)
RDW: 12.4 % (ref 11.5–14.6)
WBC: 5.4 10*3/uL (ref 4.5–10.5)

## 2013-08-04 LAB — BASIC METABOLIC PANEL
BUN: 15 mg/dL (ref 6–23)
CO2: 28 mEq/L (ref 19–32)
CREATININE: 1.2 mg/dL (ref 0.4–1.5)
Calcium: 9.6 mg/dL (ref 8.4–10.5)
Chloride: 103 mEq/L (ref 96–112)
GFR: 71.44 mL/min (ref >60.00)
Glucose, Bld: 86 mg/dL (ref 70–99)
Potassium: 4.6 mEq/L (ref 3.5–5.1)
Sodium: 138 mEq/L (ref 135–145)

## 2013-08-04 LAB — LIPID PANEL
CHOLESTEROL: 192 mg/dL (ref 0–200)
HDL: 46.5 mg/dL (ref >39.00)
LDL Cholesterol: 126 mg/dL — ABNORMAL HIGH (ref 0–99)
TRIGLYCERIDES: 98 mg/dL (ref 0.0–149.0)
Total CHOL/HDL Ratio: 4
VLDL: 19.6 mg/dL (ref 0.0–40.0)

## 2013-08-04 LAB — URINALYSIS, ROUTINE W REFLEX MICROSCOPIC
Bilirubin Urine: NEGATIVE
HGB URINE DIPSTICK: NEGATIVE
Ketones, ur: NEGATIVE
NITRITE: NEGATIVE
PH: 6 (ref 5.0–8.0)
Specific Gravity, Urine: 1.025 (ref 1.000–1.030)
TOTAL PROTEIN, URINE-UPE24: NEGATIVE
URINE GLUCOSE: NEGATIVE
Urobilinogen, UA: 0.2 (ref 0.0–1.0)

## 2013-08-04 LAB — PSA: PSA: 1.74 ng/mL (ref 0.10–4.00)

## 2013-08-04 LAB — TSH: TSH: 4.39 u[IU]/mL (ref 0.35–4.50)

## 2013-08-04 NOTE — Patient Instructions (Signed)
Nice to meet you Ice 20 minutes 2 times a day Exercises 3 times a week.  Increase your Vitamin D to 2000 IU daily.  Watch sleeping position Tennis ball on chair between shoulder blades Monitor position See me in 3 weeks.

## 2013-08-04 NOTE — Progress Notes (Signed)
[facility] [place], [place] [address] Phone: [phone] Subjective:    I'm seeing this patient by the request  of:  Walker Kehr, MD   CC: Right shoulder pain  YSA:[id number] Aaron Sharp is a 48 y.o. male coming in with complaint of right shoulder pain. Patient has had a past medical history significant for rotator cuff repair of the right shoulder. Patient states since the surgery it has never felt right. Patient notices that he has some discomfort whenever he is reaching across his body especially when putting on a seatbelt. Patient describes as more of a dull aching sensation. Patient denies that it stops him from any activity and does not wake him up at night but has noticed that this pain can give him trouble. Patient denies any radiation of the arm or any numbness or weakness. Patient has had significant amount neck pain that is associated with but not at all times. Patient was given trigger point injection by primary care provider that did help with relieving the discomfort by about 50%. Patient with the severity of 5/10.     Past medical history, social, surgical and family history all reviewed in electronic medical record.   Review of Systems: No headache, visual changes, nausea, vomiting, diarrhea, constipation, dizziness, abdominal pain, skin rash, fevers, chills, night sweats, weight loss, swollen lymph nodes, body aches, joint swelling, muscle aches, chest pain, shortness of breath, mood changes.   Objective Blood pressure 120/82, pulse 55, weight 187 lb (84.823 kg), SpO2 98.00%.  General: No apparent distress alert and oriented x3 mood and affect normal, dressed appropriately.  HEENT: Pupils equal, extraocular movements intact  Respiratory: Patient's speak in full sentences and does not appear short of breath  Cardiovascular: No lower extremity edema, non tender, no erythema  Skin: Warm dry intact with no signs of infection or  rash on extremities or on axial skeleton.  Abdomen: Soft nontender  Neuro: Cranial nerves II through XII are intact, neurovascularly intact in all extremities with 2+ DTRs and 2+ pulses.  Lymph: No lymphadenopathy of posterior or anterior cervical chain or axillae bilaterally.  Gait normal with good balance and coordination.  MSK:  Non tender with full range of motion and good stability and symmetric strength and tone of  elbows, wrist, hip, knee and ankles bilaterally.  Shoulder: Right Inspection reveals no abnormalities, atrophy or asymmetry. Palpation is normal with no tenderness over AC joint or bicipital groove. ROM is full in all planes. Rotator cuff strength normal throughout. Mild signs of impingement with positive Neer and Hawkin's tests, but negative empty can sign. Speeds and Yergason's tests normal. labral pathology noted with positive Obrien's, negative clunk and good stability. Normal scapular function observed. No painful arc and no drop arm sign. No apprehension sign Contralateral shoulder unremarkable  Neck: Inspection unremarkable. No palpable stepoffs. Negative Spurling's maneuver. Full neck range of motion Grip strength and sensation normal in bilateral hands Strength good C4 to T1 distribution No sensory change to C4 to T1 Negative Hoffman sign bilaterally Reflexes normal Patient does have some mild trapezius muscle spasm.   MSK US performed of: Right This study was ordered, performed, and interpreted by Charlann Boxer D.O.  Shoulder:   Supraspinatus:  Appears normal on long and transverse views, no bursal bulge seen with shoulder abduction on impingement view. Mild bursitis is noted though. Infraspinatus:  Appears normal on long and transverse views. Subscapularis:  Appears normal on long and transverse views. Teres Minor:  Appears normal on long and transverse views. AC joint:  Capsule undistended, no geyser sign. Glenohumeral Joint:  Appears normal without  effusion. Glenoid Labrum:  Intact without visualized tears. Patient does have postoperative changes with some scar tissue still related. Biceps Tendon:  Appears normal on long and transverse views, no fraying of tendon, tendon located in intertubercular groove, no subluxation with shoulder internal or external rotation. No increased power doppler signal.  Impression: Nonspecific bursitis with scarring of previous labral tear.  Procedure: Real-time Ultrasound Guided Injection of right glenohumeral joint Device: GE Logiq E  Ultrasound guided injection is preferred based studies that show increased duration, increased effect, greater accuracy, decreased procedural pain, increased response rate with ultrasound guided versus blind injection.  Verbal informed consent obtained.  Time-out conducted.  Noted no overlying erythema, induration, or other signs of local infection.  Skin prepped in a sterile fashion.  Local anesthesia: Topical Ethyl chloride.  With sterile technique and under real time ultrasound guidance:  Joint visualized.  23g 1  inch needle inserted posterior approach. Pictures taken for needle placement. Patient did have injection of 2 cc of 1% lidocaine, 2 cc of 0.5% Marcaine, and 1.0 cc of Kenalog 40 mg/dL. Completed without difficulty  Pain immediately resolved suggesting accurate placement of the medication.  Advised to call if fevers/chills, erythema, induration, drainage, or persistent bleeding.  Images permanently stored and available for review in the ultrasound unit.  Impression: Technically successful ultrasound guided injection.      Impression and Recommendations:     This case required medical decision making of moderate complexity.

## 2013-08-04 NOTE — Assessment & Plan Note (Signed)
Patient does have scarring from a labral injury it appears likely secondary to surgery. Patient was given an injection day and tolerated the procedure very well with still mild pain but overall significantly better. We discussed postural exercises and other things that can be beneficial and was given home exercises for the shoulder. We discussed over-the-counter supplementation I can be helpful as well. Patient will try these interventions and come back again in 3 weeks for further evaluation.

## 2013-08-07 ENCOUNTER — Encounter: Payer: Self-pay | Admitting: Internal Medicine

## 2013-08-07 NOTE — Assessment & Plan Note (Signed)
Trigger point inj 

## 2013-08-25 ENCOUNTER — Encounter: Payer: Self-pay | Admitting: Internal Medicine

## 2013-08-25 ENCOUNTER — Ambulatory Visit (INDEPENDENT_AMBULATORY_CARE_PROVIDER_SITE_OTHER): Payer: PRIVATE HEALTH INSURANCE | Admitting: Family Medicine

## 2013-08-25 ENCOUNTER — Other Ambulatory Visit: Payer: PRIVATE HEALTH INSURANCE

## 2013-08-25 ENCOUNTER — Encounter: Payer: Self-pay | Admitting: Family Medicine

## 2013-08-25 VITALS — BP 126/84 | HR 57 | Ht 71.0 in | Wt 187.0 lb

## 2013-08-25 DIAGNOSIS — R8290 Unspecified abnormal findings in urine: Secondary | ICD-10-CM

## 2013-08-25 DIAGNOSIS — M24111 Other articular cartilage disorders, right shoulder: Secondary | ICD-10-CM

## 2013-08-25 DIAGNOSIS — S43439A Superior glenoid labrum lesion of unspecified shoulder, initial encounter: Secondary | ICD-10-CM

## 2013-08-25 LAB — URINALYSIS, ROUTINE W REFLEX MICROSCOPIC
Bilirubin Urine: NEGATIVE
Hgb urine dipstick: NEGATIVE
Ketones, ur: NEGATIVE
NITRITE: NEGATIVE
RBC / HPF: NONE SEEN (ref 0–?)
SPECIFIC GRAVITY, URINE: 1.015 (ref 1.000–1.030)
Total Protein, Urine: NEGATIVE
Urine Glucose: NEGATIVE
Urobilinogen, UA: 0.2 (ref 0.0–1.0)
pH: 6 (ref 5.0–8.0)

## 2013-08-25 MED ORDER — MELOXICAM 15 MG PO TABS: 15.0000 mg | ORAL_TABLET | Freq: Every day | ORAL | Status: DC

## 2013-08-25 NOTE — Progress Notes (Signed)
  [name] [address] [place], [place] [address] Phone: [phone] Subjective:    CC: Right shoulder pain follow up  [id number] Aaron Sharp is a 48 y.o. male coming in with complaint of right shoulder pain. It previously and did have a shoulder bursitis as well as scarring from a previous shoulder surgery for this labrum. Patient was given a corticosteroid injection was given home exercise program. Patient states it is approximately 95% better. Patient states he still has a mild twinge with certain activity. Patient was able to do any activity that he would like. Patient is not doing exercises on a regular basis and has not icing as much as it should. Denies any new symptoms or any neck pain. Overall feeling fairly good.     Past medical history, social, surgical and family history all reviewed in electronic medical record.   Review of Systems: No headache, visual changes, nausea, vomiting, diarrhea, constipation, dizziness, abdominal pain, skin rash, fevers, chills, night sweats, weight loss, swollen lymph nodes, body aches, joint swelling, muscle aches, chest pain, shortness of breath, mood changes.   Objective Blood pressure 126/84, pulse 57, height 5\' 11"  (1.803 m), weight 187 lb (84.823 kg), SpO2 99.00%.  General: No apparent distress alert and oriented x3 mood and affect normal, dressed appropriately.  HEENT: Pupils equal, extraocular movements intact  Respiratory: Patient's speak in full sentences and does not appear short of breath  Cardiovascular: No lower extremity edema, non tender, no erythema  Skin: Warm dry intact with no signs of infection or rash on extremities or on axial skeleton.  Abdomen: Soft nontender  Neuro: Cranial nerves II through XII are intact, neurovascularly intact in all extremities with 2+ DTRs and 2+ pulses.  Lymph: No lymphadenopathy of posterior or anterior cervical chain or axillae bilaterally.  Gait normal with  good balance and coordination.  MSK:  Non tender with full range of motion and good stability and symmetric strength and tone of  elbows, wrist, hip, knee and ankles bilaterally.  Shoulder: Right Inspection reveals no abnormalities, atrophy or asymmetry. Palpation is normal with no tenderness over AC joint or bicipital groove. ROM is full in all planes. Rotator cuff strength normal throughout. Mild signs of impingement with positive Neer and Hawkin's tests, but negative empty can sign. Speeds and Yergason's tests normal. labral pathology noted with positive Obrien's, negative clunk and good stability. Normal scapular function observed. No painful arc and no drop arm sign. No apprehension sign Contralateral shoulder unremarkable  Neck: Inspection unremarkable. No palpable stepoffs. Negative Spurling's maneuver. Full neck range of motion Grip strength and sensation normal in bilateral hands Strength good C4 to T1 distribution No sensory change to C4 to T1 Negative Hoffman sign bilaterally Reflexes normal     Impression and Recommendations:     This case required medical decision making of moderate complexity.

## 2013-08-25 NOTE — Assessment & Plan Note (Signed)
He is doing very well with conservative therapy and after an intra-articular injection. Patient given other home exercises and phase II strengthening that could be beneficial. We discussed doing the icing as well. Patient will follow up again in 3-4 weeks if he continues to have pain. Otherwise at this time as the patient continues to do well he can followup on an as-needed basis.

## 2013-08-25 NOTE — Patient Instructions (Signed)
Good to see you Keep doing what you are doing.  Ice still activity.  Posture on wall heels, butt shoulder and head against wall for total of 5 minutes daily.  Up right rows and bent over rows 3 times a week.  50% weight and increase 10% a week thereafter.  Come back again in 3-4 weeks if not perfect or need manipulation.

## 2013-08-26 LAB — CULTURE, URINE COMPREHENSIVE
Colony Count: NO GROWTH
ORGANISM ID, BACTERIA: NO GROWTH

## 2013-09-01 ENCOUNTER — Encounter: Payer: Self-pay | Admitting: Internal Medicine

## 2013-09-02 ENCOUNTER — Encounter: Payer: Self-pay | Admitting: Family Medicine

## 2013-09-03 ENCOUNTER — Ambulatory Visit (INDEPENDENT_AMBULATORY_CARE_PROVIDER_SITE_OTHER): Payer: PRIVATE HEALTH INSURANCE | Admitting: Family Medicine

## 2013-09-03 ENCOUNTER — Encounter: Payer: Self-pay | Admitting: Family Medicine

## 2013-09-03 VITALS — BP 130/76 | HR 51 | Ht 72.0 in | Wt 181.0 lb

## 2013-09-03 DIAGNOSIS — IMO0002 Reserved for concepts with insufficient information to code with codable children: Secondary | ICD-10-CM

## 2013-09-03 DIAGNOSIS — M5416 Radiculopathy, lumbar region: Secondary | ICD-10-CM

## 2013-09-03 MED ORDER — PREDNISONE 50 MG PO TABS: 50.0000 mg | ORAL_TABLET | Freq: Every day | ORAL | Status: DC

## 2013-09-03 MED ORDER — GABAPENTIN 300 MG PO CAPS: ORAL_CAPSULE | ORAL | Status: DC

## 2013-09-03 NOTE — Patient Instructions (Signed)
Good to see you Ice 20 minutes 2 times a day Prednisone daily for 5 days.  Gabapentin 300mg  at night No exercises for next 72 hours then can try again.  Note to wife:  Sorry about garage, doctors orders.  See me again next week.

## 2013-09-03 NOTE — Assessment & Plan Note (Signed)
Patient is coming in this seems to be localized more over the L5-S1 nerve root impingement. I'm concerned for a possible partial herniated disc at this time. Patient does not have any weakness at this time but is having radiation down the leg that is giving him some questionable numbness at the time of sleep. Patient's pain intractable. We will do prednisone burst for the next 5 days, gabapentin at night, icing protocol. We'll decrease his activity over the course of the next 72 hours and then increase slowly. Patient was given another home exercise handout specifically for herniated disc to start after 72 hours. Patient can follow up again in one week for further evaluation. If patient continues to have pain we need to consider imaging him further advanced treatment options.  Spent greater than 25 minutes with patient face-to-face and had greater than 50% of counseling including as described above in assessment and plan.

## 2013-09-03 NOTE — Progress Notes (Signed)
  [name] [place], [place] [address] Phone: [phone] Subjective:     CC: Hip pain  [id number] Aaron Sharp is a 48 y.o. male coming in with complaint of is having some hip pain mostly on the lateral aspect but also in the back. Patient unfortunately states that it radiates down the posterior aspect of his leg and stops there is knee. Patient states it is worse at night.  Patient states that it is now more of a chronic dull burning sensation that goes down the leg almost constantly. Patient started difficult and delayed secondary to this pain. Patient does have a large trip planned for [place] in the next week. Patient would like to be able to stimulate better. States that the pain is 8/10 in severity.     Past medical history, social, surgical and family history all reviewed in electronic medical record.   Review of Systems: No headache, visual changes, nausea, vomiting, diarrhea, constipation, dizziness, abdominal pain, skin rash, fevers, chills, night sweats, weight loss, swollen lymph nodes, body aches, joint swelling, muscle aches, chest pain, shortness of breath, mood changes.   Objective Blood pressure 130/76, pulse 51, height 6' (1.829 m), weight 181 lb (82.101 kg), SpO2 98.00%.  General: No apparent distress alert and oriented x3 mood and affect normal, dressed appropriately.  HEENT: Pupils equal, extraocular movements intact  Respiratory: Patient's speak in full sentences and does not appear short of breath  Cardiovascular: No lower extremity edema, non tender, no erythema  Skin: Warm dry intact with no signs of infection or rash on extremities or on axial skeleton.  Abdomen: Soft nontender  Neuro: Cranial nerves II through XII are intact, neurovascularly intact in all extremities with 2+ DTRs and 2+ pulses.  Lymph: No lymphadenopathy of posterior or anterior cervical chain or axillae bilaterally.  Gait normal with good  balance and coordination.  MSK:  Non tender with full range of motion and good stability and symmetric strength and tone of shoulders, elbows, wrist, hip, knee and ankles bilaterally.  Back Exam:  Inspection: Unremarkable  Motion: Flexion 35 deg, Extension 35 deg, Side Bending to 35 deg bilaterally,  Rotation to 45 deg bilaterally  SLR laying: Positive straight leg XSLR laying: Negative  Palpable tenderness: Tenderness over the right SI joint in the right paraspinal musculature of the lowest lumbar spine.Marland Kitchen FABER: negative. Sensory change: Gross sensation intact to all lumbar and sacral dermatomes.  Reflexes: 2+ at both patellar tendons, 2+ at achilles tendons, Babinski's downgoing.  Strength at foot  Plantar-flexion: 5/5 Dorsi-flexion: 5/5 Eversion: 5/5 Inversion: 5/5  Leg strength  Quad: 5/5 Hamstring: 5/5 Hip flexor: 5/5 Hip abductors: 5/5  Gait unremarkable cautious.    Impression and Recommendations:     This case required medical decision making of moderate complexity.

## 2013-09-07 ENCOUNTER — Encounter: Payer: Self-pay | Admitting: Internal Medicine

## 2013-09-07 ENCOUNTER — Other Ambulatory Visit (INDEPENDENT_AMBULATORY_CARE_PROVIDER_SITE_OTHER): Payer: PRIVATE HEALTH INSURANCE

## 2013-09-07 ENCOUNTER — Ambulatory Visit (INDEPENDENT_AMBULATORY_CARE_PROVIDER_SITE_OTHER): Payer: PRIVATE HEALTH INSURANCE | Admitting: Internal Medicine

## 2013-09-07 VITALS — BP 130/70 | HR 64 | Temp 98.2°F | Resp 16 | Wt 183.0 lb

## 2013-09-07 DIAGNOSIS — Z0000 Encounter for general adult medical examination without abnormal findings: Secondary | ICD-10-CM

## 2013-09-07 DIAGNOSIS — R8289 Other abnormal findings on cytological and histological examination of urine: Secondary | ICD-10-CM | POA: Insufficient documentation

## 2013-09-07 DIAGNOSIS — R82998 Other abnormal findings in urine: Secondary | ICD-10-CM

## 2013-09-07 LAB — URINALYSIS
Bilirubin Urine: NEGATIVE
HGB URINE DIPSTICK: NEGATIVE
KETONES UR: NEGATIVE
Leukocytes, UA: NEGATIVE
Nitrite: NEGATIVE
Specific Gravity, Urine: 1.02 (ref 1.000–1.030)
Total Protein, Urine: NEGATIVE
UROBILINOGEN UA: 0.2 (ref 0.0–1.0)
Urine Glucose: NEGATIVE
pH: 6 (ref 5.0–8.0)

## 2013-09-07 NOTE — Progress Notes (Signed)
   Subjective:    HPI  F/u abn UA Pt has mentioned a little urgency but not bad. "When I do urinate it is sometimes slightly painful...Marland Kitchenor just feels very relieving". No h/o STD C/o ongoing R shoulder pain - better  Wt Readings from Last 3 Encounters:  09/07/13 183 lb (83.008 kg)  09/03/13 181 lb (82.101 kg)  08/25/13 187 lb (84.823 kg)   BP Readings from Last 3 Encounters:  09/07/13 130/70  09/03/13 130/76  08/25/13 126/84       .Review of Systems  Constitutional: Negative for appetite change, fatigue and unexpected weight change.  HENT: Negative for congestion, nosebleeds, sneezing, sore throat and trouble swallowing.   Eyes: Negative for itching and visual disturbance.  Respiratory: Negative for cough.   Cardiovascular: Negative for chest pain, palpitations and leg swelling.  Gastrointestinal: Negative for nausea, diarrhea, blood in stool and abdominal distention.  Genitourinary: Negative for frequency and hematuria.  Musculoskeletal: Negative for back pain, gait problem, joint swelling and neck pain.  Skin: Negative for rash.  Neurological: Negative for dizziness, tremors, speech difficulty and weakness.  Psychiatric/Behavioral: Negative for sleep disturbance, dysphoric mood and agitation. The patient is not nervous/anxious.        Objective:   Physical Exam  Constitutional: He is oriented to person, place, and time. He appears well-developed.  HENT:  Mouth/Throat: Oropharynx is clear and moist.  Eyes: Conjunctivae are normal. Pupils are equal, round, and reactive to light.  Neck: Normal range of motion. No JVD present. No thyromegaly present.  Cardiovascular: Normal rate, regular rhythm, normal heart sounds and intact distal pulses.  Exam reveals no gallop and no friction rub.   No murmur heard. Pulmonary/Chest: Effort normal and breath sounds normal. No respiratory distress. He has no wheezes. He has no rales. He exhibits no tenderness.  Abdominal: Soft. Bowel  sounds are normal. He exhibits no distension and no mass. There is no tenderness. There is no rebound and no guarding.  Musculoskeletal: Normal range of motion. He exhibits tenderness (B shoulders NT; R trap w/a 3 tender spots posteriorly). He exhibits no edema.  Lymphadenopathy:    He has no cervical adenopathy.  Neurological: He is alert and oriented to person, place, and time. He has normal reflexes. No cranial nerve deficit. He exhibits normal muscle tone. Coordination normal.  Skin: Skin is warm and dry. No rash noted.  L lat neck w/oval flat lump over mid SKM 1.7x1.2 cm NT  Psychiatric: He has a normal mood and affect. His behavior is normal. Judgment and thought content normal.   Lab Results  Component Value Date   WBC 5.4 08/04/2013   HGB 15.4 08/04/2013   HCT 44.7 08/04/2013   PLT 216.0 08/04/2013   GLUCOSE 86 08/04/2013   CHOL 192 08/04/2013   TRIG 98.0 08/04/2013   HDL 46.50 08/04/2013   LDLCALC 126* 08/04/2013   ALT 21 08/04/2013   AST 21 08/04/2013   NA 138 08/04/2013   K 4.6 08/04/2013   CL 103 08/04/2013   CREATININE 1.2 08/04/2013   BUN 15 08/04/2013   CO2 28 08/04/2013   TSH 4.39 08/04/2013   PSA 1.74 08/04/2013   INR 1.09 11/17/2009          Assessment & Plan:

## 2013-09-07 NOTE — Assessment & Plan Note (Signed)
Pt has mentioned a little urgency but not bad. "When I do urinate it is sometimes slightly painful...Marland Kitchenor just feels very relieving". No h/o STD Will check renal US and urine test for chlamydia

## 2013-09-07 NOTE — Progress Notes (Signed)
Pre visit review using our clinic review tool, if applicable. No additional management support is needed unless otherwise documented below in the visit note. 

## 2013-09-07 NOTE — Assessment & Plan Note (Deleted)
Pt has mentioned a little urgency but not bad. When I do urinate it is sometimes slightly painful...Marland Kitchenor just feels very relieving. No h/o STD Will check [place] and urine test for chlamydia

## 2013-09-08 LAB — GC/CHLAMYDIA PROBE AMP, URINE
CHLAMYDIA, SWAB/URINE, PCR: NEGATIVE
GC Probe Amp, Urine: NEGATIVE

## 2013-09-09 ENCOUNTER — Encounter: Payer: Self-pay | Admitting: Family Medicine

## 2013-09-09 ENCOUNTER — Ambulatory Visit (INDEPENDENT_AMBULATORY_CARE_PROVIDER_SITE_OTHER): Payer: PRIVATE HEALTH INSURANCE | Admitting: Family Medicine

## 2013-09-09 VITALS — BP 124/72 | HR 48 | Ht 72.0 in | Wt 183.0 lb

## 2013-09-09 DIAGNOSIS — IMO0002 Reserved for concepts with insufficient information to code with codable children: Secondary | ICD-10-CM

## 2013-09-09 DIAGNOSIS — M999 Biomechanical lesion, unspecified: Secondary | ICD-10-CM

## 2013-09-09 DIAGNOSIS — M9981 Other biomechanical lesions of cervical region: Secondary | ICD-10-CM

## 2013-09-09 DIAGNOSIS — M5416 Radiculopathy, lumbar region: Secondary | ICD-10-CM

## 2013-09-09 NOTE — Assessment & Plan Note (Signed)
Near completely resolved at this time. Patient has not taken any medications and did respond very well to osteopathic manipulation. Patient was given home exercise program and he is traveling to [place]. Patient given exercises he can do on his trip. We discussed proper lifting mechanics and getting up out of his chair during the airplane flight every 2 hours. Discuss over-the-counter medications that can be beneficial. Patient will followup again in 4 weeks for further evaluation and treatment.  Spent greater than 25 minutes with patient face-to-face and had greater than 50% of counseling including as described above in assessment and plan.

## 2013-09-09 NOTE — Assessment & Plan Note (Signed)
Decision today to treat with OMT was based on Physical Exam  After verbal consent patient was treated with HVLA techniques in cervical, thoracic, lumbar and sacral areas  Patient tolerated the procedure well with improvement in symptoms  Patient given exercises, stretches and lifestyle modifications  See medications in patient instructions if given  Patient will follow up in 4 weeks

## 2013-09-09 NOTE — Patient Instructions (Signed)
You are doing great! I am glad your back is doing well Consider taking prednisone with you on the trip.  See me when you return maybe in 4 weeks.

## 2013-09-09 NOTE — Progress Notes (Signed)
  [facility] [address] [place], [place] [address] Phone: [phone] Subjective:     CC: Hip pain back pain  [id number] Aaron Sharp is a 48 y.o. male coming in for followup of back pain.  Patient states he did not get prednisone states that he is doing 90% better. Patient has not started the exercises at this time is going to start racing. Patient denies any radiation to his legs any numbness or weakness. Overall patient has been doing well. Patient is no longer taking ibuprofen for pain relief.     Past medical history, social, surgical and family history all reviewed in electronic medical record.   Review of Systems: No headache, visual changes, nausea, vomiting, diarrhea, constipation, dizziness, abdominal pain, skin rash, fevers, chills, night sweats, weight loss, swollen lymph nodes, body aches, joint swelling, muscle aches, chest pain, shortness of breath, mood changes.   Objective Blood pressure 124/72, pulse 48, height 6' (1.829 m), weight 183 lb (83.008 kg), SpO2 98.00%.  General: No apparent distress alert and oriented x3 mood and affect normal, dressed appropriately.  HEENT: Pupils equal, extraocular movements intact  Respiratory: Patient's speak in full sentences and does not appear short of breath  Cardiovascular: No lower extremity edema, non tender, no erythema  Skin: Warm dry intact with no signs of infection or rash on extremities or on axial skeleton.  Abdomen: Soft nontender  Neuro: Cranial nerves II through XII are intact, neurovascularly intact in all extremities with 2+ DTRs and 2+ pulses.  Lymph: No lymphadenopathy of posterior or anterior cervical chain or axillae bilaterally.  Gait normal with good balance and coordination.  MSK:  Non tender with full range of motion and good stability and symmetric strength and tone of shoulders, elbows, wrist, hip, knee and ankles bilaterally.  Back Exam:  Inspection: Unremarkable    Motion: Flexion 45 deg, Extension 35 deg, Side Bending to 35 deg bilaterally,  Rotation to 45 deg bilaterally  SLR laying: Negative XSLR laying: Negative  Palpable tenderness: Minimally tender over the right SI joint FABER: negative. Sensory change: Gross sensation intact to all lumbar and sacral dermatomes.  Reflexes: 2+ at both patellar tendons, 2+ at achilles tendons, Babinski's downgoing.  Strength at foot  Plantar-flexion: 5/5 Dorsi-flexion: 5/5 Eversion: 5/5 Inversion: 5/5  Leg strength  Quad: 5/5 Hamstring: 5/5 Hip flexor: 5/5 Hip abductors: 5/5  Gait unremarkable normal  OMT Physical Exam  Standing flexion right  Seated Flexion right  Cervical  C3 flexed rotated and side bent left C7 flexed rotated and side bent left  Thoracic T2 extended rotated and side bent right with elevated second rib  Lumbar L2 flexed rotated and side bent left   Sacrum Right on right Illium       Impression and Recommendations:     This case required medical decision making of moderate complexity.

## 2013-10-05 ENCOUNTER — Other Ambulatory Visit: Payer: PRIVATE HEALTH INSURANCE

## 2013-10-07 ENCOUNTER — Encounter: Payer: Self-pay | Admitting: Family Medicine

## 2013-10-07 ENCOUNTER — Ambulatory Visit (INDEPENDENT_AMBULATORY_CARE_PROVIDER_SITE_OTHER): Payer: PRIVATE HEALTH INSURANCE | Admitting: Family Medicine

## 2013-10-07 VITALS — BP 132/80 | HR 65 | Ht 72.0 in | Wt 186.0 lb

## 2013-10-07 DIAGNOSIS — M9981 Other biomechanical lesions of cervical region: Secondary | ICD-10-CM

## 2013-10-07 DIAGNOSIS — M5416 Radiculopathy, lumbar region: Secondary | ICD-10-CM

## 2013-10-07 DIAGNOSIS — M999 Biomechanical lesion, unspecified: Secondary | ICD-10-CM

## 2013-10-07 DIAGNOSIS — IMO0002 Reserved for concepts with insufficient information to code with codable children: Secondary | ICD-10-CM

## 2013-10-07 NOTE — Patient Instructions (Signed)
Good to see you I am glad [place] was great.  Continue the exercises overall.  Continue the medications.  Lets say 4-6 weeks.

## 2013-10-07 NOTE — Progress Notes (Signed)
  [name] [place],  [address] Phone: [phone] Subjective:     CC: Hip pain back pain, neck and shoulder pain  [id number] Aaron Sharp is a 48 y.o. male coming in for followup of back pain.  Patient continues to do relatively well. Patient denies any radiation of pain. Patient just got back from a 2 week trip to [place]. Patient states that over the last 4 days he started having some mild discomfort. Patient states though that overall the right shoulder and the neck is improved. Patient's low back is doing relatively well as well. Patient denies any radiation down the leg or any numbness. Patient never did get MRI of the low back.     Past medical history, social, surgical and family history all reviewed in electronic medical record.   Review of Systems: No headache, visual changes, nausea, vomiting, diarrhea, constipation, dizziness, abdominal pain, skin rash, fevers, chills, night sweats, weight loss, swollen lymph nodes, body aches, joint swelling, muscle aches, chest pain, shortness of breath, mood changes.   Objective Blood pressure 132/80, pulse 65, height 6' (1.829 m), weight 186 lb (84.369 kg), SpO2 97.00%.  General: No apparent distress alert and oriented x3 mood and affect normal, dressed appropriately.  HEENT: Pupils equal, extraocular movements intact  Respiratory: Patient's speak in full sentences and does not appear short of breath  Cardiovascular: No lower extremity edema, non tender, no erythema  Skin: Warm dry intact with no signs of infection or rash on extremities or on axial skeleton.  Abdomen: Soft nontender  Neuro: Cranial nerves II through XII are intact, neurovascularly intact in all extremities with 2+ DTRs and 2+ pulses.  Lymph: No lymphadenopathy of posterior or anterior cervical chain or axillae bilaterally.  Gait normal with good balance and coordination.  MSK:  Non tender with full range of motion  and good stability and symmetric strength and tone of shoulders, elbows, wrist, hip, knee and ankles bilaterally.  Back Exam:  Inspection: Unremarkable  Motion: Flexion 45 deg, Extension 35 deg, Side Bending to 35 deg bilaterally,  Rotation to 45 deg bilaterally  SLR laying: Negative XSLR laying: Negative  Palpable tenderness: Minimally tender over the right SI joint FABER: negative. Sensory change: Gross sensation intact to all lumbar and sacral dermatomes.  Reflexes: 2+ at both patellar tendons, 2+ at achilles tendons, Babinski's downgoing.  Strength at foot  Plantar-flexion: 5/5 Dorsi-flexion: 5/5 Eversion: 5/5 Inversion: 5/5  Leg strength  Quad: 5/5 Hamstring: 5/5 Hip flexor: 5/5 Hip abductors: 5/5  Gait unremarkable normal  OMT Physical Exam   Cervical  C3 flexed rotated and side bent left C7 flexed rotated and side bent left  Thoracic T2 extended rotated and side bent right with elevated second rib  Lumbar L2 flexed rotated and side bent left   Sacrum Right on right Illium       Impression and Recommendations:     This case required medical decision making of moderate complexity.

## 2013-10-07 NOTE — Assessment & Plan Note (Signed)
Patient is responding remarkably well to osteopathic manipulation. Patient is no longer having any radicular symptoms are significant. Patient will watch this and continue to monitor. Patient continues to have any trouble we'll consider further imaging but at this point is doing very well with conservative therapy. He'll continue the exercises as he is and procedure. Patient will come back again in 4-6 weeks for further evaluation and treatment.  Spent greater than 25 minutes with patient face-to-face and had greater than 50% of counseling including as described above in assessment and plan.

## 2013-10-07 NOTE — Assessment & Plan Note (Signed)
Decision today to treat with OMT was based on Physical Exam  After verbal consent patient was treated with HVLA techniques in cervical, thoracic, lumbar and sacral areas  Patient tolerated the procedure well with improvement in symptoms  Patient given exercises, stretches and lifestyle modifications  See medications in patient instructions if given  Patient will follow up in 4-6 weeks

## 2013-10-15 ENCOUNTER — Ambulatory Visit: Payer: PRIVATE HEALTH INSURANCE | Admitting: Family Medicine

## 2013-11-12 ENCOUNTER — Telehealth: Payer: Self-pay

## 2013-11-12 NOTE — Telephone Encounter (Signed)
The pt is due for a recall EUS see 12/2010 EUS I spoke with the pt and he wants to speak with his PCP and decide if he wants to repeat the procedure.  He will call back if he indeed wants to proceed.

## 2013-11-12 NOTE — Telephone Encounter (Signed)
Ok, thanks.

## 2013-11-17 ENCOUNTER — Telehealth: Payer: Self-pay | Admitting: Internal Medicine

## 2013-11-17 ENCOUNTER — Ambulatory Visit: Payer: PRIVATE HEALTH INSURANCE | Admitting: Family Medicine

## 2013-11-17 DIAGNOSIS — Z0289 Encounter for other administrative examinations: Secondary | ICD-10-CM

## 2013-11-17 NOTE — Telephone Encounter (Signed)
Patient no showed for follow up appt on 11/17/2013.  Please advise.

## 2013-11-17 NOTE — Telephone Encounter (Signed)
Noted  

## 2014-07-30 ENCOUNTER — Other Ambulatory Visit (INDEPENDENT_AMBULATORY_CARE_PROVIDER_SITE_OTHER): Payer: PRIVATE HEALTH INSURANCE

## 2014-07-30 ENCOUNTER — Encounter: Payer: Self-pay | Admitting: Internal Medicine

## 2014-07-30 ENCOUNTER — Ambulatory Visit (INDEPENDENT_AMBULATORY_CARE_PROVIDER_SITE_OTHER): Payer: PRIVATE HEALTH INSURANCE | Admitting: Internal Medicine

## 2014-07-30 VITALS — BP 108/70 | HR 56 | Ht 72.0 in | Wt 192.0 lb

## 2014-07-30 DIAGNOSIS — Z0000 Encounter for general adult medical examination without abnormal findings: Secondary | ICD-10-CM

## 2014-07-30 DIAGNOSIS — Z0100 Encounter for examination of eyes and vision without abnormal findings: Secondary | ICD-10-CM | POA: Diagnosis not present

## 2014-07-30 LAB — CBC WITH DIFFERENTIAL/PLATELET
BASOS ABS: 0 10*3/uL (ref 0.0–0.1)
Basophils Relative: 0.4 % (ref 0.0–3.0)
EOS PCT: 1 % (ref 0.0–5.0)
Eosinophils Absolute: 0.1 10*3/uL (ref 0.0–0.7)
HCT: 44.9 % (ref 39.0–52.0)
HEMOGLOBIN: 15.7 g/dL (ref 13.0–17.0)
Lymphocytes Relative: 28.3 % (ref 12.0–46.0)
Lymphs Abs: 1.5 10*3/uL (ref 0.7–4.0)
MCHC: 34.9 g/dL (ref 30.0–36.0)
MCV: 91.2 fl (ref 78.0–100.0)
MONO ABS: 0.5 10*3/uL (ref 0.1–1.0)
MONOS PCT: 9.7 % (ref 3.0–12.0)
Neutro Abs: 3.3 10*3/uL (ref 1.4–7.7)
Neutrophils Relative %: 60.6 % (ref 43.0–77.0)
PLATELETS: 191 10*3/uL (ref 150.0–400.0)
RBC: 4.93 Mil/uL (ref 4.22–5.81)
RDW: 12.7 % (ref 11.5–15.5)
WBC: 5.5 10*3/uL (ref 4.0–10.5)

## 2014-07-30 LAB — LIPID PANEL
CHOLESTEROL: 187 mg/dL (ref 0–200)
HDL: 44.2 mg/dL (ref >39.00)
LDL Cholesterol: 115 mg/dL — ABNORMAL HIGH (ref 0–99)
NonHDL: 142.8
Total CHOL/HDL Ratio: 4
Triglycerides: 138 mg/dL (ref 0.0–149.0)
VLDL: 27.6 mg/dL (ref 0.0–40.0)

## 2014-07-30 LAB — URINALYSIS, ROUTINE W REFLEX MICROSCOPIC
BILIRUBIN URINE: NEGATIVE
Hgb urine dipstick: NEGATIVE
Ketones, ur: NEGATIVE
NITRITE: NEGATIVE
PH: 6 (ref 5.0–8.0)
RBC / HPF: NONE SEEN (ref 0–?)
Total Protein, Urine: NEGATIVE
Urine Glucose: NEGATIVE
Urobilinogen, UA: 0.2 (ref 0.0–1.0)

## 2014-07-30 LAB — HEPATIC FUNCTION PANEL
ALK PHOS: 62 U/L (ref 39–117)
ALT: 16 U/L (ref 0–53)
AST: 19 U/L (ref 0–37)
Albumin: 4.3 g/dL (ref 3.5–5.2)
BILIRUBIN TOTAL: 0.7 mg/dL (ref 0.2–1.2)
Bilirubin, Direct: 0.1 mg/dL (ref 0.0–0.3)
Total Protein: 6.9 g/dL (ref 6.0–8.3)

## 2014-07-30 LAB — BASIC METABOLIC PANEL
BUN: 16 mg/dL (ref 6–23)
CALCIUM: 9.7 mg/dL (ref 8.4–10.5)
CO2: 28 mEq/L (ref 19–32)
Chloride: 103 mEq/L (ref 96–112)
Creatinine, Ser: 1.09 mg/dL (ref 0.40–1.50)
GFR: 77.18 mL/min (ref >60.00)
Glucose, Bld: 85 mg/dL (ref 70–99)
Potassium: 4.4 mEq/L (ref 3.5–5.1)
Sodium: 137 mEq/L (ref 135–145)

## 2014-07-30 LAB — TSH: TSH: 3.99 u[IU]/mL (ref 0.35–4.50)

## 2014-07-30 LAB — PSA: PSA: 2.13 ng/mL (ref 0.10–4.00)

## 2014-07-30 MED ORDER — VITAMIN D 1000 UNITS PO TABS: 1000.0000 [IU] | ORAL_TABLET | Freq: Every day | ORAL | Status: AC

## 2014-07-30 NOTE — Assessment & Plan Note (Signed)
We discussed age appropriate health related issues, including available/recomended screening tests and vaccinations. We discussed a need for adhering to healthy diet and exercise. Labs/EKG were reviewed/ordered. All questions were answered.   

## 2014-07-30 NOTE — Progress Notes (Signed)
   Subjective:    HPI  The patient is here for a wellness exam. The patient has been doing well overall without major physical or psychological issues going on lately C/o ongoing R shoulder pain. Playing tennis and disk golf  Wt Readings from Last 3 Encounters:  07/30/14 192 lb (87.091 kg)  10/07/13 186 lb (84.369 kg)  09/09/13 183 lb (83.008 kg)   BP Readings from Last 3 Encounters:  07/30/14 108/70  10/07/13 132/80  09/09/13 124/72       .Review of Systems  Constitutional: Negative for appetite change, fatigue and unexpected weight change.  HENT: Negative for congestion, nosebleeds, sneezing, sore throat and trouble swallowing.   Eyes: Negative for itching and visual disturbance.  Respiratory: Negative for cough.   Cardiovascular: Negative for chest pain, palpitations and leg swelling.  Gastrointestinal: Negative for nausea, diarrhea, blood in stool and abdominal distention.  Genitourinary: Negative for frequency and hematuria.  Musculoskeletal: Negative for back pain, joint swelling, gait problem and neck pain.  Skin: Negative for rash.  Neurological: Negative for dizziness, tremors, speech difficulty and weakness.  Psychiatric/Behavioral: Negative for sleep disturbance, dysphoric mood and agitation. The patient is not nervous/anxious.        Objective:   Physical Exam  Constitutional: He is oriented to person, place, and time. He appears well-developed.  HENT:  Mouth/Throat: Oropharynx is clear and moist.  Eyes: Conjunctivae are normal. Pupils are equal, round, and reactive to light.  Neck: Normal range of motion. No JVD present. No thyromegaly present.  Cardiovascular: Normal rate, regular rhythm, normal heart sounds and intact distal pulses.  Exam reveals no gallop and no friction rub.   No murmur heard. Pulmonary/Chest: Effort normal and breath sounds normal. No respiratory distress. He has no wheezes. He has no rales. He exhibits no tenderness.  Abdominal:  Soft. Bowel sounds are normal. He exhibits no distension and no mass. There is no tenderness. There is no rebound and no guarding.  Musculoskeletal: Normal range of motion. He exhibits tenderness (B shoulders NT; R trap w/a 3 tender spots posteriorly). He exhibits no edema.  Lymphadenopathy:    He has no cervical adenopathy.  Neurological: He is alert and oriented to person, place, and time. He has normal reflexes. No cranial nerve deficit. He exhibits normal muscle tone. Coordination normal.  Skin: Skin is warm and dry. No rash noted.  L lat neck w/oval flat lump over mid SKM 1.7x1.2 cm NT  Psychiatric: He has a normal mood and affect. His behavior is normal. Judgment and thought content normal.   Lab Results  Component Value Date   WBC 5.4 08/04/2013   HGB 15.4 08/04/2013   HCT 44.7 08/04/2013   PLT 216.0 08/04/2013   GLUCOSE 86 08/04/2013   CHOL 192 08/04/2013   TRIG 98.0 08/04/2013   HDL 46.50 08/04/2013   LDLCALC 126* 08/04/2013   ALT 21 08/04/2013   AST 21 08/04/2013   NA 138 08/04/2013   K 4.6 08/04/2013   CL 103 08/04/2013   CREATININE 1.2 08/04/2013   BUN 15 08/04/2013   CO2 28 08/04/2013   TSH 4.39 08/04/2013   PSA 1.74 08/04/2013   INR 1.09 11/17/2009       Assessment & Plan:

## 2014-07-30 NOTE — Progress Notes (Signed)
Pre visit review using our clinic review tool, if applicable. No additional management support is needed unless otherwise documented below in the visit note. 

## 2015-03-07 ENCOUNTER — Encounter: Payer: Self-pay | Admitting: Family Medicine

## 2015-03-07 ENCOUNTER — Ambulatory Visit (INDEPENDENT_AMBULATORY_CARE_PROVIDER_SITE_OTHER): Payer: PRIVATE HEALTH INSURANCE | Admitting: Family Medicine

## 2015-03-07 DIAGNOSIS — M9903 Segmental and somatic dysfunction of lumbar region: Secondary | ICD-10-CM

## 2015-03-07 DIAGNOSIS — M9902 Segmental and somatic dysfunction of thoracic region: Secondary | ICD-10-CM

## 2015-03-07 DIAGNOSIS — M940 Chondrocostal junction syndrome [Tietze]: Secondary | ICD-10-CM | POA: Diagnosis not present

## 2015-03-07 DIAGNOSIS — M9901 Segmental and somatic dysfunction of cervical region: Secondary | ICD-10-CM | POA: Diagnosis not present

## 2015-03-07 DIAGNOSIS — M999 Biomechanical lesion, unspecified: Secondary | ICD-10-CM

## 2015-03-07 NOTE — Patient Instructions (Addendum)
Good to see you Aaron Sharp is your friend pennsaid pinkie amount topically 2 times daily as needed.  Exercises 3 times a week.  Vitamin D 2000 IU daily Turmeric 500mg  twice daily See me again in 3 weeks to make sure the rib stayed and we will go from there Happy holidays!

## 2015-03-07 NOTE — Assessment & Plan Note (Signed)
Patient responded very well to manipulation today. Patient learn home exercises in greater detail working on scapular stabilization. We discussed icing regimen. Given topical anti-inflammatories. Encourage increasing patient's vitamin D as well as other over-the-counter natural supplements. We discussed which activities to do an which was potentially avoid. We discussed ergonomics or at the day. Patient and will come back and see me again in 3-4 weeks for further evaluation and treatment.

## 2015-03-07 NOTE — Progress Notes (Signed)
[facility] [address] [place], [place] [address] Phone: [phone] Subjective:    I'm seeing this patient by the request  of:  Walker Kehr, MD   CC: Right shoulder pain  [id number] Aaron Sharp is a 49 y.o. male coming in with complaint of right shoulder pain. Patient has had a past medical history significant for rotator cuff repair of the right shoulder. Patient states since the surgery it has never felt right.  Patient did see me for this problem greater than a year and a half ago. Patient did respond very well to an injection. Patient also continued to have some  Trouble with upper back pain. Patient did respond well to manipulative therapy for his lower back and upper back. Patient states started having similar presentation approximately 3-4 months ago. States that hurts him when he is sleeping at night. Especially when he rolls on that side. Denies any weakness or numbness. States that is significant worse after doing a significant amount of repetitive activity including painting. Rates the severity of pain a 7 out of 10. He tries not to take any over-the-counter medications.     Past medical history, social, surgical and family history all reviewed in electronic medical record.   Review of Systems: No headache, visual changes, nausea, vomiting, diarrhea, constipation, dizziness, abdominal pain, skin rash, fevers, chills, night sweats, weight loss, swollen lymph nodes, body aches, joint swelling, muscle aches, chest pain, shortness of breath, mood changes.   Objective There were no vitals taken for this visit.  General: No apparent distress alert and oriented x3 mood and affect normal, dressed appropriately.  HEENT: Pupils equal, extraocular movements intact  Respiratory: Patient's speak in full sentences and does not appear short of breath  Cardiovascular: No lower extremity edema, non tender, no erythema  Skin: Warm dry intact with no  signs of infection or rash on extremities or on axial skeleton.  Abdomen: Soft nontender  Neuro: Cranial nerves II through XII are intact, neurovascularly intact in all extremities with 2+ DTRs and 2+ pulses.  Lymph: No lymphadenopathy of posterior or anterior cervical chain or axillae bilaterally.  Gait normal with good balance and coordination.  MSK:  Non tender with full range of motion and good stability and symmetric strength and tone of  elbows, wrist, hip, knee and ankles bilaterally.  Shoulder: Right Inspection reveals no abnormalities, atrophy or asymmetry. Palpation is normal with no tenderness over AC joint or bicipital groove. ROM is full in all planes. Rotator cuff strength normal throughout. Mild signs of impingement with positive Neer and Hawkin's tests, but negative empty can sign. Speeds and Yergason's tests normal. labral pathology noted with positive Obrien's, negative clunk and good stability. Normal scapular function observed. No painful arc and no drop arm sign. No apprehension sign Contralateral shoulder unremarkable  Neck: Inspection unremarkable. No palpable stepoffs. Negative Spurling's maneuver. Full neck range of motion Grip strength and sensation normal in bilateral hands Strength good C4 to T1 distribution No sensory change to C4 to T1 Negative Hoffman sign bilaterally Reflexes normal Patient does have some mild trapezius muscle spasm.  OMT Physical Exam   Cervical  C3 flexed rotated and side bent left C7 flexed rotated and side bent left  Thoracic T2 extended rotated and side bent right with elevated second rib  Lumbar L2 flexed rotated and side bent left   Sacrum Right on right [place]  Procedure note.  97110; 15 minutes spent for Therapeutic exercises as stated in  above notes.  This included exercises focusing on stretching, strengthening, with significant focus on eccentric aspects.  Basic scapular stabilization to include adduction  and depression of scapula Scaption, focusing on proper movement and good control Internal and External rotation utilizing a theraband, with elbow tucked at side entire time Rows with theraband Proper technique shown and discussed handout in great detail with ATC.  All questions were discussed and answered.     Impression and Recommendations:     This case required medical decision making of moderate complexity.

## 2015-03-07 NOTE — Assessment & Plan Note (Signed)
Decision today to treat with OMT was based on Physical Exam  After verbal consent patient was treated with HVLA techniques in cervical, thoracic, lumbar and sacral areas  Patient tolerated the procedure well with improvement in symptoms  Patient given exercises, stretches and lifestyle modifications  See medications in patient instructions if given  Patient will follow up in 3-4 weeks  

## 2015-03-29 ENCOUNTER — Ambulatory Visit: Payer: PRIVATE HEALTH INSURANCE | Admitting: Family Medicine

## 2015-08-01 ENCOUNTER — Encounter: Payer: Self-pay | Admitting: Internal Medicine

## 2015-08-01 ENCOUNTER — Ambulatory Visit (INDEPENDENT_AMBULATORY_CARE_PROVIDER_SITE_OTHER): Payer: PRIVATE HEALTH INSURANCE | Admitting: Internal Medicine

## 2015-08-01 VITALS — BP 120/80 | HR 67 | Ht 72.0 in | Wt 198.0 lb

## 2015-08-01 DIAGNOSIS — Z0000 Encounter for general adult medical examination without abnormal findings: Secondary | ICD-10-CM | POA: Diagnosis not present

## 2015-08-01 DIAGNOSIS — Z0100 Encounter for examination of eyes and vision without abnormal findings: Secondary | ICD-10-CM | POA: Diagnosis not present

## 2015-08-01 DIAGNOSIS — M25511 Pain in right shoulder: Secondary | ICD-10-CM

## 2015-08-01 NOTE — Progress Notes (Signed)
Pre visit review using our clinic review tool, if applicable. No additional management support is needed unless otherwise documented below in the visit note. 

## 2015-08-01 NOTE — Assessment & Plan Note (Signed)
Dr Tamala Julian  Pt had a rotator cuff surgery 2010(?)

## 2015-08-01 NOTE — Assessment & Plan Note (Signed)
We discussed age appropriate health related issues, including available/recomended screening tests and vaccinations. We discussed a need for adhering to healthy diet and exercise. Labs/EKG were reviewed/ordered. All questions were answered.   

## 2015-08-01 NOTE — Progress Notes (Signed)
Subjective:  Patient ID: Aaron Sharp, male    DOB: [birth year]  Age: 50 y.o. MRN: [id number]  CC: Annual Exam   HPI Aaron Sharp presents for a well exam C/o R shoulder pain  Outpatient Prescriptions Prior to Visit  Medication Sig Dispense Refill  . ibuprofen (ADVIL,MOTRIN) 600 MG tablet Take 600 mg by mouth every 6 (six) hours as needed.       Facility-Administered Medications Prior to Visit  Medication Dose Route Frequency Provider Last Rate Last Dose  . methylPREDNISolone acetate (DEPO-MEDROL) injection 40 mg  40 mg Intramuscular Once Soul Deveney V, MD        ROS Review of Systems  Constitutional: Negative for appetite change, fatigue and unexpected weight change.  HENT: Negative for congestion, nosebleeds, sneezing, sore throat and trouble swallowing.   Eyes: Negative for itching and visual disturbance.  Respiratory: Negative for cough.   Cardiovascular: Negative for chest pain, palpitations and leg swelling.  Gastrointestinal: Negative for nausea, diarrhea, blood in stool and abdominal distention.  Genitourinary: Negative for frequency and hematuria.  Musculoskeletal: Positive for arthralgias. Negative for back pain, joint swelling, gait problem and neck pain.  Skin: Negative for rash.  Neurological: Negative for dizziness, tremors, speech difficulty and weakness.  Psychiatric/Behavioral: Negative for suicidal ideas, sleep disturbance, dysphoric mood and agitation. The patient is not nervous/anxious.    snoring  Objective:  BP 120/80 mmHg  Pulse 67  Ht 6' (1.829 m)  Wt 198 lb (89.812 kg)  BMI 26.85 kg/m2  SpO2 98%  BP Readings from Last 3 Encounters:  08/01/15 120/80  07/30/14 108/70  10/07/13 132/80    Wt Readings from Last 3 Encounters:  08/01/15 198 lb (89.812 kg)  07/30/14 192 lb (87.091 kg)  10/07/13 186 lb (84.369 kg)    Physical Exam  Constitutional: He is oriented to person, place, and time. He appears well-developed. No distress.  NAD   HENT:  Mouth/Throat: Oropharynx is clear and moist.  Eyes: Conjunctivae are normal. Pupils are equal, round, and reactive to light.  Neck: Normal range of motion. No JVD present. No thyromegaly present.  Cardiovascular: Normal rate, regular rhythm, normal heart sounds and intact distal pulses.  Exam reveals no gallop and no friction rub.   No murmur heard. Pulmonary/Chest: Effort normal and breath sounds normal. No respiratory distress. He has no wheezes. He has no rales. He exhibits no tenderness.  Abdominal: Soft. Bowel sounds are normal. He exhibits no distension and no mass. There is no tenderness. There is no rebound and no guarding.  Musculoskeletal: Normal range of motion. He exhibits no edema or tenderness.  Lymphadenopathy:    He has no cervical adenopathy.  Neurological: He is alert and oriented to person, place, and time. He has normal reflexes. No cranial nerve deficit. He exhibits normal muscle tone. He displays a negative Romberg sign. Coordination and gait normal.  Skin: Skin is warm and dry. No rash noted.  Psychiatric: He has a normal mood and affect. His behavior is normal. Judgment and thought content normal.    Lab Results  Component Value Date   WBC 5.5 07/30/2014   HGB 15.7 07/30/2014   HCT 44.9 07/30/2014   PLT 191.0 07/30/2014   GLUCOSE 85 07/30/2014   CHOL 187 07/30/2014   TRIG 138.0 07/30/2014   HDL 44.20 07/30/2014   LDLCALC 115* 07/30/2014   ALT 16 07/30/2014   AST 19 07/30/2014   NA 137 07/30/2014   K 4.4 07/30/2014   CL 103 07/30/2014  CREATININE 1.09 07/30/2014   BUN 16 07/30/2014   CO2 28 07/30/2014   TSH 3.99 07/30/2014   PSA 2.13 07/30/2014   INR 1.09 11/17/2009    Ct Soft Tissue Neck W Contrast  07/09/2012  *RADIOLOGY REPORT* Clinical Data: Left-sided neck mass. CT NECK WITH CONTRAST Technique:  Multidetector CT imaging of the neck was performed with intravenous contrast. Contrast: 20mL OMNIPAQUE IOHEXOL 300 MG/ML  SOLN Comparison: None.  Findings: Lung apices are clear.  No superior mediastinal pathology.  Limited visualization of the intracranial contents does not show any abnormality. Both parotid glands are normal.  Both submandibular glands are normal.  The thyroid gland is normal. No regional bony abnormality.  No evidence of mucosal or submucosal lesion.  Arterial and venous structures appear normal.  There are no enlarged lymph nodes.  Marker put in place over the area of concern overlies a normal appearing external jugular vein, sternocleidomastoid muscle and carotid space. I do not see a a mass lesion to explain any palpable abnormality. IMPRESSION: Normal examination.  No imaging correlate to the palpable area of concern. Original Report Authenticated By: Nelson Chimes, M.D.    Assessment & Plan:   Aaron Sharp was seen today for annual exam.  Diagnoses and all orders for this visit:  Pain in joint of right shoulder  Well adult exam   I am having Aaron Sharp maintain his ibuprofen. We will continue to administer methylPREDNISolone acetate.  No orders of the defined types were placed in this encounter.     Follow-up: No Follow-up on file.  Walker Kehr, MD

## 2015-08-23 ENCOUNTER — Other Ambulatory Visit (INDEPENDENT_AMBULATORY_CARE_PROVIDER_SITE_OTHER): Payer: PRIVATE HEALTH INSURANCE

## 2015-08-23 DIAGNOSIS — Z0000 Encounter for general adult medical examination without abnormal findings: Secondary | ICD-10-CM

## 2015-08-23 LAB — CBC WITH DIFFERENTIAL/PLATELET
Basophils Absolute: 0 10*3/uL (ref 0.0–0.1)
Basophils Relative: 0.4 % (ref 0.0–3.0)
EOS PCT: 1.4 % (ref 0.0–5.0)
Eosinophils Absolute: 0.1 10*3/uL (ref 0.0–0.7)
HCT: 43.8 % (ref 39.0–52.0)
HEMOGLOBIN: 15.2 g/dL (ref 13.0–17.0)
LYMPHS PCT: 31.4 % (ref 12.0–46.0)
Lymphs Abs: 1.9 10*3/uL (ref 0.7–4.0)
MCHC: 34.7 g/dL (ref 30.0–36.0)
MCV: 90 fl (ref 78.0–100.0)
MONOS PCT: 9 % (ref 3.0–12.0)
Monocytes Absolute: 0.6 10*3/uL (ref 0.1–1.0)
Neutro Abs: 3.6 10*3/uL (ref 1.4–7.7)
Neutrophils Relative %: 57.8 % (ref 43.0–77.0)
Platelets: 191 10*3/uL (ref 150.0–400.0)
RBC: 4.87 Mil/uL (ref 4.22–5.81)
RDW: 12.3 % (ref 11.5–15.5)
WBC: 6.2 10*3/uL (ref 4.0–10.5)

## 2015-08-23 LAB — LIPID PANEL
CHOLESTEROL: 215 mg/dL — AB (ref 0–200)
HDL: 35.1 mg/dL — ABNORMAL LOW (ref >39.00)
LDL CALC: 141 mg/dL — AB (ref 0–99)
NONHDL: 180.09
Total CHOL/HDL Ratio: 6
Triglycerides: 193 mg/dL — ABNORMAL HIGH (ref 0.0–149.0)
VLDL: 38.6 mg/dL (ref 0.0–40.0)

## 2015-08-23 LAB — BASIC METABOLIC PANEL
BUN: 15 mg/dL (ref 6–23)
CO2: 27 mEq/L (ref 19–32)
Calcium: 9.6 mg/dL (ref 8.4–10.5)
Chloride: 105 mEq/L (ref 96–112)
Creatinine, Ser: 1.11 mg/dL (ref 0.40–1.50)
GFR: 75.24 mL/min (ref >60.00)
Glucose, Bld: 92 mg/dL (ref 70–99)
POTASSIUM: 4.3 meq/L (ref 3.5–5.1)
SODIUM: 138 meq/L (ref 135–145)

## 2015-08-23 LAB — HEPATIC FUNCTION PANEL
ALBUMIN: 4.5 g/dL (ref 3.5–5.2)
ALK PHOS: 57 U/L (ref 39–117)
ALT: 18 U/L (ref 0–53)
AST: 18 U/L (ref 0–37)
Bilirubin, Direct: 0.1 mg/dL (ref 0.0–0.3)
Total Bilirubin: 0.5 mg/dL (ref 0.2–1.2)
Total Protein: 7.2 g/dL (ref 6.0–8.3)

## 2015-08-23 LAB — URINALYSIS, ROUTINE W REFLEX MICROSCOPIC
Bilirubin Urine: NEGATIVE
Hgb urine dipstick: NEGATIVE
Ketones, ur: NEGATIVE
Nitrite: NEGATIVE
PH: 5 (ref 5.0–8.0)
SPECIFIC GRAVITY, URINE: 1.02 (ref 1.000–1.030)
TOTAL PROTEIN, URINE-UPE24: NEGATIVE
Urine Glucose: NEGATIVE
Urobilinogen, UA: 0.2 (ref 0.0–1.0)

## 2015-08-23 LAB — TSH: TSH: 3.77 u[IU]/mL (ref 0.35–4.50)

## 2015-08-23 LAB — PSA: PSA: 2.2 ng/mL (ref 0.10–4.00)

## 2015-09-19 ENCOUNTER — Encounter: Payer: Self-pay | Admitting: Family Medicine

## 2015-09-19 ENCOUNTER — Other Ambulatory Visit: Payer: Self-pay

## 2015-09-19 DIAGNOSIS — M25511 Pain in right shoulder: Secondary | ICD-10-CM

## 2015-09-19 DIAGNOSIS — M542 Cervicalgia: Principal | ICD-10-CM

## 2015-09-19 DIAGNOSIS — G8929 Other chronic pain: Secondary | ICD-10-CM

## 2015-09-22 ENCOUNTER — Ambulatory Visit (INDEPENDENT_AMBULATORY_CARE_PROVIDER_SITE_OTHER)
Admission: RE | Admit: 2015-09-22 | Discharge: 2015-09-22 | Disposition: A | Discharge status: Home / Self Care | Payer: PRIVATE HEALTH INSURANCE | Source: Ambulatory Visit | Attending: Family Medicine | Admitting: Family Medicine

## 2015-09-22 DIAGNOSIS — G8929 Other chronic pain: Secondary | ICD-10-CM

## 2015-09-22 DIAGNOSIS — M542 Cervicalgia: Secondary | ICD-10-CM | POA: Diagnosis not present

## 2015-10-05 ENCOUNTER — Encounter: Payer: Self-pay | Admitting: Family Medicine

## 2015-10-07 ENCOUNTER — Ambulatory Visit (INDEPENDENT_AMBULATORY_CARE_PROVIDER_SITE_OTHER): Payer: PRIVATE HEALTH INSURANCE | Admitting: Family Medicine

## 2015-10-07 ENCOUNTER — Encounter: Payer: Self-pay | Admitting: Family Medicine

## 2015-10-07 VITALS — BP 124/80 | HR 62 | Wt 199.0 lb

## 2015-10-07 DIAGNOSIS — M5412 Radiculopathy, cervical region: Secondary | ICD-10-CM | POA: Diagnosis not present

## 2015-10-07 DIAGNOSIS — M9903 Segmental and somatic dysfunction of lumbar region: Secondary | ICD-10-CM | POA: Diagnosis not present

## 2015-10-07 DIAGNOSIS — M999 Biomechanical lesion, unspecified: Secondary | ICD-10-CM

## 2015-10-07 DIAGNOSIS — M6248 Contracture of muscle, other site: Secondary | ICD-10-CM

## 2015-10-07 DIAGNOSIS — M9902 Segmental and somatic dysfunction of thoracic region: Secondary | ICD-10-CM

## 2015-10-07 DIAGNOSIS — M9901 Segmental and somatic dysfunction of cervical region: Secondary | ICD-10-CM

## 2015-10-07 DIAGNOSIS — M62838 Other muscle spasm: Secondary | ICD-10-CM

## 2015-10-07 MED ORDER — PREDNISONE 50 MG PO TABS: 50.0000 mg | ORAL_TABLET | Freq: Every day | ORAL | Status: DC

## 2015-10-07 MED ORDER — GABAPENTIN 100 MG PO CAPS: 200.0000 mg | ORAL_CAPSULE | Freq: Every day | ORAL | Status: DC

## 2015-10-07 NOTE — Progress Notes (Signed)
  [facility] [address] [place], [place] [address] Phone: [phone] Subjective:    I'm seeing this patient by the request  of:  Walker Kehr, MD   CC: Right shoulder pain f/u [id number] Aaron Sharp is a 50 y.o. male coming in with complaint of right shoulder pain. Patient has had a past medical history significant for rotator cuff repair of the right shoulder. Patient states since the surgery it has never felt right. We saw patient in tenderness more of a scapular dysfunction. Continued have pain and more radicular symptoms and there was concern for cervical radiculopathy. X-rays were ordered, and independently last by me. X-ray showed no significant bony normality but still significant straightening of the cervical lordosis. Patient states that the manipulation helps 34 days and unfortunately pain comes back. Has had pain severe enough that has stopped him from activity. Has tried dry needling as well as acupuncture with very minimal benefits. States that the pain is unrelenting and possibly even worsening on a regular basis.    Past medical history, social, surgical and family history all reviewed in electronic medical record.   Review of Systems: No headache, visual changes, nausea, vomiting, diarrhea, constipation, dizziness, abdominal pain, skin rash, fevers, chills, night sweats, weight loss, swollen lymph nodes, body aches, joint swelling, muscle aches, chest pain, shortness of breath, mood changes.   Objective Blood pressure 124/80, pulse 62, weight 199 lb (90.266 kg).  General: No apparent distress alert and oriented x3 mood and affect normal, dressed appropriately.  HEENT: Pupils equal, extraocular movements intact  Respiratory: Patient's speak in full sentences and does not appear short of breath  Cardiovascular: No lower extremity edema, non tender, no erythema  Skin: Warm dry intact with no signs of infection or rash on extremities or  on axial skeleton.  Abdomen: Soft nontender  Neuro: Cranial nerves II through XII are intact, neurovascularly intact in all extremities with 2+ DTRs and 2+ pulses.  Lymph: No lymphadenopathy of posterior or anterior cervical chain or axillae bilaterally.  Gait normal with good balance and coordination.  MSK:  Non tender with full range of motion and good stability and symmetric strength and tone of  elbows, wrist, hip, knee and ankles bilaterally.  Shoulder: Right Inspection reveals no abnormalities, atrophy or asymmetry. Palpation is normal with no tenderness over AC joint or bicipital groove. ROM is full in all planes. Rotator cuff strength normal throughout. Negative impingement today Speeds and Yergason's tests normal. labral pathology noted with positive Obrien's, negative clunk and good stability. Normal scapular function observed. No painful arc and no drop arm sign. No apprehension sign Contralateral shoulder unremarkable  Neck: Inspection unremarkable. No palpable stepoffs. Positive Spurling's which is new radicular symptoms down the posterior aspect of the scapula and mild lateral shoulder Full neck range of motion Grip strength and sensation normal in bilateral hands Strength good C4 to T1 distribution No sensory change to C4 to T1 Negative Hoffman sign bilaterally Reflexes normal Patient does have some mild trapezius muscle spasm.  OMT Physical Exam   Cervical  C2 flexed rotated and side bent left C7 flexed rotated and side bent left  Thoracic T2 extended rotated and side bent right with inhaledsecond rib  Lumbar L2 flexed rotated and side bent left   Sacrum Right on right      Impression and Recommendations:     This case required medical decision making of moderate complexity.

## 2015-10-07 NOTE — Assessment & Plan Note (Signed)
Decision today to treat with OMT was based on Physical Exam  After verbal consent patient was treated with HVLA techniques in cervical, thoracic, lumbar and sacral areas  Patient tolerated the procedure well with improvement in symptoms  Patient given exercises, stretches and lifestyle modifications  See medications in patient instructions if given  Patient will follow up in 3-4 weeks

## 2015-10-07 NOTE — Patient Instructions (Addendum)
Good to see you  Ice is your friend when needed Prednisone daily for 5 days Gabapentin 200mg  at night  PT will be calling you. They will focus more on your neck.   If more pain down arm, weakness please send me a message Send message in 2 weeks and if not better we may need the MRI If manipulation does help then lets say 4-6 weeks.

## 2015-10-07 NOTE — Assessment & Plan Note (Signed)
Patient did have some cervical radiculopathy noted today on exam. Attempted manipulation again today. 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 We discussed we can consider injection of the shoulder necessary. Patient has worsening symptoms or no significant improvement after prednisone as well as gabapentin we may need to consider MRI of the neck as well for further evaluation and treatment. Patient will come back again in 2-3 weeks for further evaluation and treatment.

## 2015-10-17 ENCOUNTER — Encounter: Payer: Self-pay | Admitting: Family Medicine

## 2015-10-20 ENCOUNTER — Ambulatory Visit (INDEPENDENT_AMBULATORY_CARE_PROVIDER_SITE_OTHER): Payer: PRIVATE HEALTH INSURANCE | Admitting: Family Medicine

## 2015-10-20 ENCOUNTER — Encounter: Payer: Self-pay | Admitting: Family Medicine

## 2015-10-20 VITALS — BP 122/78 | HR 60 | Wt 199.0 lb

## 2015-10-20 DIAGNOSIS — M9902 Segmental and somatic dysfunction of thoracic region: Secondary | ICD-10-CM

## 2015-10-20 DIAGNOSIS — M940 Chondrocostal junction syndrome [Tietze]: Secondary | ICD-10-CM

## 2015-10-20 DIAGNOSIS — M9903 Segmental and somatic dysfunction of lumbar region: Secondary | ICD-10-CM | POA: Diagnosis not present

## 2015-10-20 DIAGNOSIS — M9901 Segmental and somatic dysfunction of cervical region: Secondary | ICD-10-CM | POA: Diagnosis not present

## 2015-10-20 DIAGNOSIS — M999 Biomechanical lesion, unspecified: Secondary | ICD-10-CM

## 2015-10-20 NOTE — Progress Notes (Signed)
[facility], [place] [address] Phone: [phone] Subjective:    I'm seeing this patient by the request  of:  Walker Kehr, MD   CC: Right shoulder pain f/u  [id number] Aaron Sharp is a 50 y.o. male coming in with complaint of right shoulder pain. Patient has had a past medical history significant for rotator cuff repair of the right shoulder. Patient states since the surgery it has never felt right. We saw patient in tenderness more of a scapular dysfunction. Continued have pain and more radicular symptoms and there was concern for cervical radiculopathy. X-rays were ordered, and independently last by me. X-ray showed no significant bony normality but still significant straightening of the cervical lordosis.  Patient 3 Weeks Ago. Attempted Osteopathic Manipulation and Started Him on Gabapentin. Patient States That He Is Approximate 60-70% Improved. Patient States That the Sharp Stabbing Pain Significantly Less. Feels That the Manipulation Helped out Significantly. Happy with the Results.  Past Medical History  Diagnosis Date  . Hiatal hernia   . GIST (gastrointestinal stromal tumor), non-malignant    Past Surgical History  Procedure Laterality Date  . Shoulder surgery     Social History  Substance Use Topics  . Smoking status: Never Smoker   . Smokeless tobacco: Never Used  . Alcohol Use: Yes   No Known Allergies Family History  Problem Relation Age of Onset  . Colon cancer Paternal Grandfather   . Prostate cancer    . Depression Mother   . Hypertension Daughter        Past medical history, social, surgical and family history all reviewed in electronic medical record.   Review of Systems: No headache, visual changes, nausea, vomiting, diarrhea, constipation, dizziness, abdominal pain, skin rash, fevers, chills, night sweats, weight loss, swollen lymph nodes, body aches, joint swelling, muscle aches, chest pain,  shortness of breath, mood changes.   Objective Blood pressure 122/78, pulse 60, weight 199 lb (90.266 kg).  General: No apparent distress alert and oriented x3 mood and affect normal, dressed appropriately.  HEENT: Pupils equal, extraocular movements intact  Respiratory: Patient's speak in full sentences and does not appear short of breath  Cardiovascular: No lower extremity edema, non tender, no erythema  Skin: Warm dry intact with no signs of infection or rash on extremities or on axial skeleton.  Abdomen: Soft nontender  Neuro: Cranial nerves II through XII are intact, neurovascularly intact in all extremities with 2+ DTRs and 2+ pulses.  Lymph: No lymphadenopathy of posterior or anterior cervical chain or axillae bilaterally.  Gait normal with good balance and coordination.  MSK:  Non tender with full range of motion and good stability and symmetric strength and tone of  elbows, wrist, hip, knee and ankles bilaterally.  Shoulder: Right Inspection reveals no abnormalities, atrophy or asymmetry. Palpation is normal with no tenderness over AC joint or bicipital groove. ROM is full in all planes. Rotator cuff strength normal throughout. Negative impingement today Speeds and Yergason's tests normal. labral pathology noted with positive Obrien's, negative clunk and good stability. Normal scapular function observed. No painful arc and no drop arm sign. No apprehension sign Contralateral shoulder unremarkable  Neck: Inspection unremarkable. No palpable stepoffs. Negative Spurling's today which is an improvement Full neck range of motion Grip strength and sensation normal in bilateral hands Strength good C4 to T1 distribution No sensory change to C4 to T1 Negative Hoffman sign bilaterally Reflexes normal Patient does have some mild trapezius muscle  spasm.  OMT Physical Exam   Cervical  C2 flexed rotated and side bent left C6 flexed rotated and side bent left  Thoracic T2  extended rotated and side bent right with inhaled second rib  Lumbar L2 flexed rotated and side bent left   Sacrum Right on right      Impression and Recommendations:     This case required medical decision making of moderate complexity.

## 2015-10-20 NOTE — Assessment & Plan Note (Signed)
Decision today to treat with OMT was based on Physical Exam  After verbal consent patient was treated with HVLA techniques in cervical, thoracic, lumbar and sacral areas  Patient tolerated the procedure well with improvement in symptoms  Patient given exercises, stretches and lifestyle modifications  See medications in patient instructions if given  Patient will follow up in 4-5 weeks

## 2015-10-20 NOTE — Assessment & Plan Note (Signed)
Patient is doing better at this time. We discussed icing regimen. Discussed continuing the course strength as well as the posture. X-rays were normal. Encourage patient to consider increasing gabapentin. We will space patient to 4-5 week intervals for manipulation.

## 2015-10-20 NOTE — Patient Instructions (Signed)
Good to see you  You are doing better On wall with heels, butt shoulder and head touching for a goal of 5 minutes daily  Try the gabapentin at 300mg  and if better send a message and I will send in a new prescription.   If no change then we will go back to 200mg   I think the manipulation is helping See me again in 5 weeks!

## 2015-11-10 ENCOUNTER — Ambulatory Visit: Payer: PRIVATE HEALTH INSURANCE | Attending: Family Medicine

## 2015-11-10 DIAGNOSIS — R293 Abnormal posture: Secondary | ICD-10-CM | POA: Insufficient documentation

## 2015-11-10 DIAGNOSIS — M542 Cervicalgia: Secondary | ICD-10-CM | POA: Diagnosis present

## 2015-11-10 DIAGNOSIS — R252 Cramp and spasm: Secondary | ICD-10-CM | POA: Insufficient documentation

## 2015-11-10 NOTE — Patient Instructions (Addendum)
PERFORM ALL EXERCISES GENTLY AND WITH GOOD POSTURE.    20 SECOND HOLD, 3 REPS TO EACH SIDE. 4-5 TIMES EACH DAY.   AROM: Neck Rotation   Turn head slowly to look over one shoulder, then the other.   AROM: Neck Flexion   Bend head forward.   AROM: Lateral Neck Flexion   Slowly tilt head toward one shoulder, then the other.    Posture - Standing   Good posture is important. Avoid slouching and forward head thrust. Maintain curve in low back and align ears over shoulders, hips over ankles.  Pull your belly button in toward your back bone. Posture Tips DO: - stand tall and erect - keep chin tucked in - keep head and shoulders in alignment - check posture regularly in mirror or large window - pull head back against headrest in car seat;  Change your position often.  Sit with lumbar support. DON'T: - slouch or slump while watching TV or reading - sit, stand or lie in one position  for too long;  Sitting is especially hard on the spine so if you sit at a desk/use the computer, then stand up often! Copyright  VHI. All rights reserved.  Posture - Sitting  Sit upright, head facing forward. Try using a roll to support lower back. Keep shoulders relaxed, and avoid rounded back. Keep hips level with knees. Avoid crossing legs for long periods. Copyright  VHI. All rights reserved.  Chronic neck strain can develop because of poor posture and faulty work habits  Postural strain related to slumped sitting and forward head posture is a leading cause of headaches, neck and upper back pain  General strengthening and flexibility exercises are helpful in the treatment of neck pain.  Most importantly, you should learn to correct the posture that may be contributing to chronic pain.   Change positions frequently  Change your work or home environment to improve posture and mechanics.   Consider a headset for cell phone and work phone.  [facility] [address], [place], [place] [address] Phone # [phone] Fax [phone]

## 2015-11-10 NOTE — Therapy (Signed)
[facility] [address] [place], [place], [address] Phone: [phone]   Fax:  [phone]  Physical Therapy Evaluation  Patient Details  Name: Aaron Sharp MRN: [id number] Date of Birth: 1965/05/06 Referring Provider: Hulan Saas, MD  Encounter Date: 11/10/2015      PT End of Session - 11/10/15 0839    Visit Number 1   Date for PT Re-Evaluation 01/05/16   PT Start Time 0804   PT Stop Time 0842   PT Time Calculation (min) 38 min   Activity Tolerance Patient tolerated treatment well   Behavior During Therapy [facility] for tasks assessed/performed      Past Medical History:  Diagnosis Date  . GIST (gastrointestinal stromal tumor), non-malignant   . Hiatal hernia     Past Surgical History:  Procedure Laterality Date  . SHOULDER SURGERY      There were no vitals filed for this visit.       Subjective Assessment - 11/10/15 0806    Subjective Pt presents to PT with neck pain and Rt shoulder/UT pain that is chronic.  Pt has been seeing MD for manipulation and postural exercise.  Pt had dose of oral prednizone 4 weeks ago and this helped to reduce the contant level of neck pain.  Pt had dry needling 2 months ago (1x) and it helped.       Pertinent History Cortizone injection into the Rt shoulder   Diagnostic tests x-ray: 3 months ago.  Reduced cervical lordosis.   Patient Stated Goals reduce neck pain, reduce muscle tension.     Currently in Pain? Yes   Pain Score 4    Pain Location Neck   Pain Orientation Right   Pain Descriptors / Indicators Aching;Tightness   Pain Type Chronic pain   Pain Onset More than a month ago   Pain Frequency Constant   Aggravating Factors  playing pool, fairly constant   Pain Relieving Factors nothing            [facility] PT Assessment - 11/10/15 0001      Assessment   Medical Diagnosis muscle spasm of neck   Referring Provider Hulan Saas, MD   Onset Date/Surgical  Date 11/09/13   Hand Dominance Right   Next MD Visit as needed   Prior Therapy none     Precautions   Precautions None     Restrictions   Weight Bearing Restrictions No     Balance Screen   Has the patient fallen in the past 6 months No   Has the patient had a decrease in activity level because of a fear of falling?  No   Is the patient reluctant to leave their home because of a fear of falling?  No     Home Ecologist residence     Prior Function   Level of Independence Independent   Vocation Full time employment   Vocation Requirements desk, walking, phone, climbing ladders   Leisure playing pool, tennis, disc golf     Cognition   Overall Cognitive Status Within Functional Limits for tasks assessed     Observation/Other Assessments   Focus on Therapeutic Outcomes (FOTO)  48% limitation     Posture/Postural Control   Posture/Postural Control Postural limitations   Postural Limitations Forward head;Rounded Shoulders     ROM / Strength   AROM / PROM / Strength AROM;PROM;Strength     AROM   Overall AROM  Deficits  Overall AROM Comments full cervical AROM except Rt sidebending limited by 10% vs the Lt.  UE AROM is full.       PROM   Overall PROM  Within functional limits for tasks performed     Strength   Overall Strength Within functional limits for tasks performed   Overall Strength Comments 5/5 UE strength     Palpation   Spinal mobility reduced PA mobiltiy C4-6, T3-7 without pain   Palpation comment tension and active trigger points in Rt>Lt UT and cervical paraspinals.                             PT Education - 11/10/15 [phone]    Education provided Yes   Education Details posture, cervical AROM   Person(s) Educated Patient   Methods Explanation;Demonstration;Handout   Comprehension Verbalized understanding;Returned demonstration          PT Short Term Goals - 11/10/15 0845      PT SHORT TERM GOAL #1    Title be independent in initial HEP   Time 4   Period Weeks   Status New     PT SHORT TERM GOAL #2   Title report a 30% reduction in neck pain with work and home tasks   Time 4   Period Weeks   Status New     PT SHORT TERM GOAL #3   Title verbalize and demonstrate correct seated posture and report corrections and use of mini breaks at work   Time 4   Period Weeks   Status New           PT Long Term Goals - 11/10/15 0804      PT LONG TERM GOAL #1   Title be independent in advanced HEP   Time 8   Period Weeks   Status New     PT LONG TERM GOAL #2   Title reduce FOTO to < or = to 38% limitation   Time 8   Period Weeks   Status New     PT LONG TERM GOAL #3   Title report a 60% reduction in frequency and intensity of neck pain with home and work tasks   Time 8   Period Weeks   Status New     PT LONG TERM GOAL #4   Title play pool with 50% less Rt UT/arm pain   Time 8   Period Weeks   Status New               Plan - 11/10/15 0840    Clinical Impression Statement Pt reports to PT with complaints of Rt>Lt sided neck pain of a chronic nature.  Pt has been treated by MD with manipulation and postural strength/education.  Pt reports significant improvement after oral steroids about a month ago.  Pt demonstrates Rt cervical AROM limitation by 10%, postural abnormality, reduced PA mobility, tension and trigger points in the Rt>Lt UT and neck.  Pt will benefit from skilled PT for manual/dry needling, postural strength and flexiblity.     Rehab Potential Good   PT Frequency 2x / week   PT Duration 8 weeks   PT Treatment/Interventions ADLs/Self Care Home Management;Cryotherapy;Electrical Stimulation;Functional mobility training;Stair training;Ultrasound;Traction;Moist Heat;Patient/family education;Neuromuscular re-education;Therapeutic exercise;Therapeutic activities;Manual techniques;Passive range of motion;Vasopneumatic Device;Taping;Dry needling   PT Next Visit  Plan Manual/dry needling to neck and UT, postural strength, flexiblity, modalities PRN, thoracic mobs   Consulted and Agree with Plan of  Care Patient      Patient will benefit from skilled therapeutic intervention in order to improve the following deficits and impairments:  Postural dysfunction, Decreased strength, Improper body mechanics, Pain, Increased muscle spasms, Decreased range of motion, Decreased activity tolerance  Visit Diagnosis: Cervicalgia - Plan: PT plan of care cert/re-cert  Abnormal posture - Plan: PT plan of care cert/re-cert  Cramp and spasm - Plan: PT plan of care cert/re-cert     Problem List Patient Active Problem List   Diagnosis Date Noted  . Cervical radiculopathy 10/07/2015  . 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 Nonallopathic lesion of cervical region 09/09/2013  . Nonallopathic lesion of thoracic region 09/09/2013  . Nonallopathic lesion of lumbosacral region 09/09/2013  . Abnormal findings on cytological and histological examination of urine 09/07/2013  . Lumbar radiculopathy 09/03/2013  . 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 Well adult exam 07/04/2012  . Neck mass 07/04/2012  . PSA, INCREASED 03/15/2010  . NEOPLASM, SKIN, UNCERTAIN BEHAVIOR [id number]  . SHOULDER PAIN, RIGHT 07/22/2008  . FOOT PAIN, LEFT 07/22/2008  . SKIN RASH 07/22/2008     Sigurd Sos, PT 11/10/15 8:52 AM  [facility] [address] [place], [place], [address] Phone: [phone]   Fax:  [phone]  Name: Aaron Sharp MRN: [id number] Date of Birth: 1966-02-29

## 2015-11-15 ENCOUNTER — Ambulatory Visit: Payer: PRIVATE HEALTH INSURANCE

## 2015-11-15 DIAGNOSIS — M542 Cervicalgia: Secondary | ICD-10-CM | POA: Diagnosis not present

## 2015-11-15 DIAGNOSIS — R293 Abnormal posture: Secondary | ICD-10-CM

## 2015-11-15 DIAGNOSIS — R252 Cramp and spasm: Secondary | ICD-10-CM

## 2015-11-15 NOTE — Patient Instructions (Addendum)

## 2015-11-15 NOTE — Therapy (Signed)
[facility] [address] [place], [place], [address] Phone: [phone]   Fax:  [phone]  Physical Therapy Treatment  Patient Details  Name: Aaron Sharp MRN: [id number] Date of Birth: 31-Aug-1965 Referring Provider: Hulan Saas, MD  Encounter Date: 11/15/2015      PT End of Session - 11/15/15 1229    Visit Number 2   Date for PT Re-Evaluation 01/05/16   PT Start Time 1152  late   PT Stop Time 1239   PT Time Calculation (min) 47 min   Activity Tolerance Patient tolerated treatment well   Behavior During Therapy [facility] for tasks assessed/performed      Past Medical History:  Diagnosis Date  . GIST (gastrointestinal stromal tumor), non-malignant   . Hiatal hernia     Past Surgical History:  Procedure Laterality Date  . SHOULDER SURGERY      There were no vitals filed for this visit.      Subjective Assessment - 11/15/15 1155    Subjective Pt reports that he has been working on his posture and doing stretches.     Currently in Pain? Yes   Pain Score 4    Pain Location Neck   Pain Orientation Right   Pain Descriptors / Indicators Tightness;Aching   Pain Type Chronic pain   Pain Onset More than a month ago   Pain Frequency Constant   Aggravating Factors  playing pool, fairly constant   Pain Relieving Factors nothing                         [facility] Adult PT Treatment/Exercise - 11/15/15 0001      Exercises   Exercises Neck     Neck Exercises: Machines for Strengthening   UBE (Upper Arm Bike) Level 1 x 6 (3/3)     Neck Exercises: Seated   Other Seated Exercise cervical flexiblity 3 ways 2x20 seconds each.     Modalities   Modalities Moist Heat     Moist Heat Therapy   Number Minutes Moist Heat 10 Minutes   Moist Heat Location Cervical     Manual Therapy   Manual Therapy Soft tissue mobilization;Myofascial release   Manual therapy comments soft tissue elongation to  Rt>Lt UT and cervical paraspinals          Trigger Point Dry Needling - 11/15/15 1204    Consent Given? Yes   Education Handout Provided Yes   Muscles Treated Upper Body Upper trapezius;Oblique capitus  cervical paraspinals   Upper Trapezius Response Twitch reponse elicited;Palpable increased muscle length   Oblique Capitus Response Twitch response elicited;Palpable increased muscle length              PT Education - 11/15/15 1206    Education provided Yes   Education Details DN info   Person(s) Educated Patient   Methods Explanation;Demonstration;Handout   Comprehension Verbalized understanding;Returned demonstration          PT Short Term Goals - 11/15/15 1158      PT SHORT TERM GOAL #1   Title be independent in initial HEP   Time 4   Period Weeks   Status On-going     PT SHORT TERM GOAL #2   Title report a 30% reduction in neck pain with work and home tasks   Time 4   Period Weeks   Status On-going     PT SHORT TERM GOAL #3   Title verbalize and demonstrate  correct seated posture and report corrections and use of mini breaks at work   Time 4   Period Weeks   Status On-going           PT Long Term Goals - 11/10/15 0804      PT LONG TERM GOAL #1   Title be independent in advanced HEP   Time 8   Period Weeks   Status New     PT LONG TERM GOAL #2   Title reduce FOTO to < or = to 38% limitation   Time 8   Period Weeks   Status New     PT LONG TERM GOAL #3   Title report a 60% reduction in frequency and intensity of neck pain with home and work tasks   Time 8   Period Weeks   Status New     PT LONG TERM GOAL #4   Title play pool with 50% less Rt UT/arm pain   Time 8   Period Weeks   Status New               Plan - 11/15/15 1200    Clinical Impression Statement Pt with only 1 session after evaluation.  Pt has been making postural corrections at home and work and is stretching regularly.  Pt with active trigger points in Rt>Lt  UT and neck and demonstrated improved tissue mobility after dry needling today.  Pt will continue to benefit from skilled PT for strength, flexiblity, manual and modalities.     Rehab Potential Good   PT Frequency 2x / week   PT Duration 8 weeks   PT Treatment/Interventions ADLs/Self Care Home Management;Cryotherapy;Electrical Stimulation;Functional mobility training;Stair training;Ultrasound;Traction;Moist Heat;Patient/family education;Neuromuscular re-education;Therapeutic exercise;Therapeutic activities;Manual techniques;Passive range of motion;Vasopneumatic Device;Taping;Dry needling   PT Next Visit Plan (P)  assess response to manual/dry needling to neck and UT, postural strength, flexiblity, modalities PRN, thoracic mobs.  scapular strength   Consulted and Agree with Plan of Care Patient      Patient will benefit from skilled therapeutic intervention in order to improve the following deficits and impairments:  Postural dysfunction, Decreased strength, Improper body mechanics, Pain, Increased muscle spasms, Decreased range of motion, Decreased activity tolerance  Visit Diagnosis: Abnormal posture  Cramp and spasm  Cervicalgia     Problem List Patient Active Problem List   Diagnosis Date Noted  . Cervical radiculopathy 10/07/2015  . 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 Nonallopathic lesion of cervical region 09/09/2013  . Nonallopathic lesion of thoracic region 09/09/2013  . Nonallopathic lesion of lumbosacral region 09/09/2013  . Abnormal findings on cytological and histological examination of urine 09/07/2013  . Lumbar radiculopathy 09/03/2013  . 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 Well adult exam 07/04/2012  . Neck mass 07/04/2012  . PSA, INCREASED 03/15/2010  . NEOPLASM, SKIN, UNCERTAIN BEHAVIOR [id number]  . SHOULDER PAIN, RIGHT 07/22/2008  . FOOT PAIN, LEFT 07/22/2008  . SKIN RASH 07/22/2008     Sigurd Sos, PT 11/15/15 12:30  PM  [facility] [address], [place], [place], [address] Phone: [phone]   Fax:  [phone]  Name: Aaron Sharp MRN: [id number] Date of Birth: June 10, 1965

## 2015-11-21 ENCOUNTER — Encounter: Payer: Self-pay | Admitting: Physical Therapy

## 2015-11-21 ENCOUNTER — Ambulatory Visit: Payer: PRIVATE HEALTH INSURANCE | Admitting: Physical Therapy

## 2015-11-21 DIAGNOSIS — M542 Cervicalgia: Secondary | ICD-10-CM

## 2015-11-21 DIAGNOSIS — R252 Cramp and spasm: Secondary | ICD-10-CM

## 2015-11-21 DIAGNOSIS — R293 Abnormal posture: Secondary | ICD-10-CM

## 2015-11-21 NOTE — Therapy (Signed)
[facility] [address], [place], [place], [address] Phone: [phone]   Fax:  [phone]  Physical Therapy Treatment  Patient Details  Name: Aaron Sharp MRN: [id number] Date of Birth: 01/18/1966 Referring Provider: Hulan Saas, MD  Encounter Date: 11/21/2015      PT End of Session - 11/21/15 0813    Visit Number 3   Date for PT Re-Evaluation 01/05/16   PT Start Time 0802   PT Stop Time 0850   PT Time Calculation (min) 48 min   Activity Tolerance Patient tolerated treatment well   Behavior During Therapy [facility] for tasks assessed/performed      Past Medical History:  Diagnosis Date  . GIST (gastrointestinal stromal tumor), non-malignant   . Hiatal hernia     Past Surgical History:  Procedure Laterality Date  . SHOULDER SURGERY      There were no vitals filed for this visit.      Subjective Assessment - 11/21/15 0805    Subjective Pt reports neck feeling a little sore today. Having some tenderness from dry needleing, isn't sure if it helped but is aggreeable to continue with needle treatmens.    Pertinent History Cortizone injection into the Rt shoulder   Diagnostic tests x-ray: 3 months ago.  Reduced cervical lordosis.   Patient Stated Goals reduce neck pain, reduce muscle tension.     Currently in Pain? Yes   Pain Score 7    Pain Location Neck   Pain Orientation Right   Pain Descriptors / Indicators Tightness   Pain Type Chronic pain   Pain Onset More than a month ago   Pain Frequency Constant   Aggravating Factors  Playing pool   Pain Relieving Factors Nothing   Multiple Pain Sites No                         [facility] Adult PT Treatment/Exercise - 11/21/15 0001      Neck Exercises: Machines for Strengthening   UBE (Upper Arm Bike) Level 1 x 6 (3/3)     Neck Exercises: Theraband   Rows 20 reps;Blue   Shoulder External Rotation 10 reps;Red  2x10 both   Horizontal  ABduction 20 reps;Red   Horizontal ABduction Limitations Fatigue   Other Theraband Exercises Lat pull 2 sets 20 reps     Modalities   Modalities Moist Heat     Moist Heat Therapy   Number Minutes Moist Heat 15 Minutes   Moist Heat Location Cervical     Neck Exercises: Stretches   Upper Trapezius Stretch 2 reps;20 seconds   Levator Stretch 2 reps;20 seconds   Neck Stretch 2 reps;20 seconds   Chest Stretch 1 rep;30 seconds                  PT Short Term Goals - 11/21/15 [id number]      PT SHORT TERM GOAL #1   Title be independent in initial HEP   Time 4   Period Weeks   Status On-going     PT SHORT TERM GOAL #2   Title report a 30% reduction in neck pain with work and home tasks   Time 4   Period Weeks   Status On-going     PT SHORT TERM GOAL #3   Title verbalize and demonstrate correct seated posture and report corrections and use of mini breaks at work   Time 4   Period Weeks   Status  On-going           PT Long Term Goals - 11/21/15 [id number]      PT LONG TERM GOAL #1   Title be independent in advanced HEP   Time 8   Period Weeks   Status On-going     PT LONG TERM GOAL #2   Title reduce FOTO to < or = to 38% limitation   Time 8   Period Weeks   Status On-going     PT LONG TERM GOAL #3   Title report a 60% reduction in frequency and intensity of neck pain with home and work tasks   Time 8   Period Weeks   Status On-going     PT LONG TERM GOAL #4   Title play pool with 50% less Rt UT/arm pain   Time 8   Period Weeks   Status On-going               Plan - 11/21/15 0818    Clinical Impression Statement Pt continues to have some neck pains with most pain in Rt UT. Pt reports that neck did feel better but then knots came back. Pt reports he is working on having good posture at work. Pt tolerated dry needle therapy well. Continue to strengthen neck stabilizing muscles and scapular muscles.     Rehab Potential Good   PT Frequency 2x / week    PT Duration 8 weeks   PT Treatment/Interventions ADLs/Self Care Home Management;Cryotherapy;Electrical Stimulation;Functional mobility training;Stair training;Ultrasound;Traction;Moist Heat;Patient/family education;Neuromuscular re-education;Therapeutic exercise;Therapeutic activities;Manual techniques;Passive range of motion;Vasopneumatic Device;Taping;Dry needling   PT Next Visit Plan Manual/dry needling to neck and UT, postural strength, flexiblity, modalities PRN, thoracic mobs, Scapular strength   Consulted and Agree with Plan of Care Patient      Patient will benefit from skilled therapeutic intervention in order to improve the following deficits and impairments:  Postural dysfunction, Decreased strength, Improper body mechanics, Pain, Increased muscle spasms, Decreased range of motion, Decreased activity tolerance  Visit Diagnosis: Abnormal posture  Cramp and spasm  Cervicalgia     Problem List Patient Active Problem List   Diagnosis Date Noted  . Cervical radiculopathy 10/07/2015  . 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 Nonallopathic lesion of cervical region 09/09/2013  . Nonallopathic lesion of thoracic region 09/09/2013  . Nonallopathic lesion of lumbosacral region 09/09/2013  . Abnormal findings on cytological and histological examination of urine 09/07/2013  . Lumbar radiculopathy 09/03/2013  . 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 Well adult exam 07/04/2012  . Neck mass 07/04/2012  . PSA, INCREASED 03/15/2010  . NEOPLASM, SKIN, UNCERTAIN BEHAVIOR [id number]  . SHOULDER PAIN, RIGHT 07/22/2008  . FOOT PAIN, LEFT 07/22/2008  . SKIN RASH 07/22/2008    Mikle Bosworth PTA 11/21/2015, 9:01 AM  [facility] [address] [place], [place], [address] Phone: [phone]   Fax:  [phone]  Name: Aaron Sharp MRN: [id number] Date of Birth: Jun 09, 1965

## 2015-11-23 ENCOUNTER — Ambulatory Visit: Payer: PRIVATE HEALTH INSURANCE

## 2015-11-23 DIAGNOSIS — M542 Cervicalgia: Secondary | ICD-10-CM | POA: Diagnosis not present

## 2015-11-23 DIAGNOSIS — R252 Cramp and spasm: Secondary | ICD-10-CM

## 2015-11-23 DIAGNOSIS — R293 Abnormal posture: Secondary | ICD-10-CM

## 2015-11-23 NOTE — Therapy (Signed)
[facility] [address] [place], [place], [address] Phone: [phone]   Fax:  [phone]  Physical Therapy Treatment  Patient Details  Name: Aaron Sharp MRN: [id number] Date of Birth: 29-Jul-1965 Referring Provider: Hulan Saas, MD  Encounter Date: 11/23/2015      PT End of Session - 11/23/15 0846    Visit Number 4   Date for PT Re-Evaluation 01/05/16   PT Start Time 0803  dry needling   PT Stop Time 0855   PT Time Calculation (min) 52 min   Activity Tolerance Patient tolerated treatment well   Behavior During Therapy [facility] for tasks assessed/performed      Past Medical History:  Diagnosis Date  . GIST (gastrointestinal stromal tumor), non-malignant   . Hiatal hernia     Past Surgical History:  Procedure Laterality Date  . SHOULDER SURGERY      There were no vitals filed for this visit.      Subjective Assessment - 11/23/15 0807    Subjective I'm feeling better today.     Pertinent History Cortizone injection into the Rt shoulder   Currently in Pain? Yes   Pain Score 4    Pain Location Neck   Pain Orientation Right   Pain Descriptors / Indicators Tightness   Pain Type Chronic pain   Pain Onset More than a month ago   Pain Frequency Constant   Aggravating Factors  playing pool, tennis, disk golf   Pain Relieving Factors nothing                         [facility] Adult PT Treatment/Exercise - 11/23/15 0001      Neck Exercises: Machines for Strengthening   UBE (Upper Arm Bike) --     Neck Exercises: Supine   Other Supine Exercise supine on foam roll for decompression:  3 minutes.  Then blue theraband D2, horizontal abduction, ER and overhead flexion 2x10 each     Modalities   Modalities Moist Heat     Moist Heat Therapy   Number Minutes Moist Heat 15 Minutes   Moist Heat Location Cervical     Manual Therapy   Manual Therapy Soft tissue mobilization;Myofascial release    Manual therapy comments soft tissue elongation to Rt>Lt UT and cervical paraspinals          Trigger Point Dry Needling - 11/23/15 [id number]    Consent Given? Yes   Muscles Treated Upper Body Upper trapezius;Oblique capitus  cervical paraspinals   Upper Trapezius Response Twitch reponse elicited;Palpable increased muscle length   Oblique Capitus Response Twitch response elicited;Palpable increased muscle length              PT Education - 11/23/15 0817    Education provided Yes   Education Details HEP: supine blue theraband   Person(s) Educated Patient   Methods Explanation;Demonstration;Handout   Comprehension Verbalized understanding;Returned demonstration          PT Short Term Goals - 11/21/15 0814      PT SHORT TERM GOAL #1   Title be independent in initial HEP   Time 4   Period Weeks   Status On-going     PT SHORT TERM GOAL #2   Title report a 30% reduction in neck pain with work and home tasks   Time 4   Period Weeks   Status On-going     PT SHORT TERM GOAL #3   Title verbalize and  demonstrate correct seated posture and report corrections and use of mini breaks at work   Time 4   Period Weeks   Status On-going           PT Long Term Goals - 11/21/15 0814      PT LONG TERM GOAL #1   Title be independent in advanced HEP   Time 8   Period Weeks   Status On-going     PT LONG TERM GOAL #2   Title reduce FOTO to < or = to 38% limitation   Time 8   Period Weeks   Status On-going     PT LONG TERM GOAL #3   Title report a 60% reduction in frequency and intensity of neck pain with home and work tasks   Time 8   Period Weeks   Status On-going     PT LONG TERM GOAL #4   Title play pool with 50% less Rt UT/arm pain   Time 8   Period Weeks   Status On-going               Plan - 11/23/15 0844    Clinical Impression Statement Pt with improved tissue mobility and reduced tensinon in the neck today.  Improved mobiltiy after dry needling.   PT initiated exercises for postural/scapular strength.  Pt is making postural corrections.  Pt will continue to benefit from skilled PT for strength, manual, and flexibility.     Rehab Potential Good   PT Frequency 2x / week   PT Duration 8 weeks   PT Treatment/Interventions ADLs/Self Care Home Management;Cryotherapy;Electrical Stimulation;Functional mobility training;Stair training;Ultrasound;Traction;Moist Heat;Patient/family education;Neuromuscular re-education;Therapeutic exercise;Therapeutic activities;Manual techniques;Passive range of motion;Vasopneumatic Device;Taping;Dry needling   PT Next Visit Plan Manual/dry needling to neck and UT, postural strength, flexiblity, modalities PRN, thoracic mobs, Scapular strength   Consulted and Agree with Plan of Care Patient      Patient will benefit from skilled therapeutic intervention in order to improve the following deficits and impairments:  Postural dysfunction, Decreased strength, Improper body mechanics, Pain, Increased muscle spasms, Decreased range of motion, Decreased activity tolerance  Visit Diagnosis: Abnormal posture  Cramp and spasm  Cervicalgia     Problem List Patient Active Problem List   Diagnosis Date Noted  . Cervical radiculopathy 10/07/2015  . 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 Nonallopathic lesion of cervical region 09/09/2013  . Nonallopathic lesion of thoracic region 09/09/2013  . Nonallopathic lesion of lumbosacral region 09/09/2013  . Abnormal findings on cytological and histological examination of urine 09/07/2013  . Lumbar radiculopathy 09/03/2013  . 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 Well adult exam 07/04/2012  . Neck mass 07/04/2012  . PSA, INCREASED 03/15/2010  . NEOPLASM, SKIN, UNCERTAIN BEHAVIOR [id number]  . SHOULDER PAIN, RIGHT 07/22/2008  . FOOT PAIN, LEFT 07/22/2008  . SKIN RASH 07/22/2008     Sigurd Sos, PT 11/23/15 8:48 AM  [facility] [address] [place], [place], [address] Phone: [phone]   Fax:  [phone]  Name: Sung Theune MRN: [id number] Date of Birth: 03-Sep-1965

## 2015-11-23 NOTE — Patient Instructions (Addendum)
Over Head Pull: Narrow Grip       On back, knees bent, feet flat, band across thighs, elbows straight but relaxed. Pull hands apart (start). Keeping elbows straight, bring arms up and over head, hands toward floor. Keep pull steady on band. Hold momentarily. Return slowly, keeping pull steady, back to start. Repeat __10_ times. Band color _yellow____   Side Pull: Double Arm   On back, knees bent, feet flat. Arms perpendicular to body, shoulder level, elbows straight but relaxed. Pull arms out to sides, elbows straight. Resistance band comes across collarbones, hands toward floor. Hold momentarily. Slowly return to starting position. Repeat _10__ times. Band color _yellow____   Sash   On back, knees bent, feet flat, left hand on left hip, right hand above left. Pull right arm DIAGONALLY (hip to shoulder) across chest. Bring right arm along head toward floor. Hold momentarily. Slowly return to starting position. Repeat __10_ times. Do with left arm. Band color ___yellow___   Shoulder Rotation: Double Arm   On back, knees bent, feet flat, elbows tucked at sides, bent 90, hands palms up. Pull hands apart and down toward floor, keeping elbows near sides. Hold momentarily. Slowly return to starting position. Repeat _10__ times. Band color __yellow    [facility] [address], [address] [place], [place] [address] Phone # [phone] Fax [phone]____   

## 2015-11-28 ENCOUNTER — Encounter: Payer: Self-pay | Admitting: Physical Therapy

## 2015-11-28 ENCOUNTER — Ambulatory Visit: Payer: PRIVATE HEALTH INSURANCE | Admitting: Physical Therapy

## 2015-11-28 DIAGNOSIS — R252 Cramp and spasm: Secondary | ICD-10-CM

## 2015-11-28 DIAGNOSIS — M542 Cervicalgia: Secondary | ICD-10-CM

## 2015-11-28 DIAGNOSIS — R293 Abnormal posture: Secondary | ICD-10-CM

## 2015-11-28 NOTE — Therapy (Signed)
[facility] [address], [place], [place], [address] Phone: [phone]   Fax:  [phone]  Physical Therapy Treatment  Patient Details  Name: Aaron Sharp MRN: [id number] Date of Birth: [birth year] Referring Provider: Hulan Saas, MD  Encounter Date: 11/28/2015      PT End of Session - 11/28/15 0828    Visit Number 5   Date for PT Re-Evaluation 01/05/16   PT Start Time 0806   PT Stop Time 0843   PT Time Calculation (min) 37 min   Activity Tolerance Patient tolerated treatment well      Past Medical History:  Diagnosis Date  . GIST (gastrointestinal stromal tumor), non-malignant   . Hiatal hernia     Past Surgical History:  Procedure Laterality Date  . SHOULDER SURGERY      There were no vitals filed for this visit.      Subjective Assessment - 11/28/15 0808    Subjective Pt reports feeling better today. Had been sore all weekend from needles but less sore today.    Pertinent History Cortizone injection into the Rt shoulder   Diagnostic tests x-ray: 3 months ago.  Reduced cervical lordosis.   Patient Stated Goals reduce neck pain, reduce muscle tension.     Currently in Pain? Yes   Pain Score 4    Pain Location Neck   Pain Orientation Right   Pain Descriptors / Indicators Tightness   Pain Type Chronic pain   Pain Onset More than a month ago   Pain Frequency Constant   Aggravating Factors  playing pool, tennis, disk golf   Pain Relieving Factors nothing   Multiple Pain Sites No                         [facility] Adult PT Treatment/Exercise - 11/28/15 0001      Exercises   Exercises Shoulder     Neck Exercises: Machines for Strengthening   UBE (Upper Arm Bike) Level 2 x 6 (3/3)     Neck Exercises: Standing   Upper Extremity D1 --     Neck Exercises: Supine   Other Supine Exercise supine on foam roll for decompression:  3 minutes.  Then blue theraband D2, horizontal  abduction, ER and overhead flexion 2x10 each     Shoulder Exercises: Standing   External Rotation AROM;Strengthening;Both;20 reps;Weights  20# power tower   Internal Rotation AROM;Strengthening;Both;20 reps;Weights  20# power tower   Extension AROM;Strengthening;Both;20 reps;Weights   Row AROM;Strengthening;Both;20 reps;Weights     Modalities   Modalities --     Manual Therapy   Manual Therapy Soft tissue mobilization;Myofascial release   Manual therapy comments soft tissue elongation to Rt>Lt UT and cervical paraspinals     Neck Exercises: Stretches   Upper Trapezius Stretch 2 reps;20 seconds   Levator Stretch 2 reps;20 seconds   Neck Stretch 2 reps;20 seconds   Chest Stretch 1 rep;30 seconds                  PT Short Term Goals - 11/28/15 0810      PT SHORT TERM GOAL #1   Title be independent in initial HEP   Time 4   Period Weeks   Status Achieved     PT SHORT TERM GOAL #2   Title report a 30% reduction in neck pain with work and home tasks   Time 4   Period Weeks   Status On-going  PT SHORT TERM GOAL #3   Title verbalize and demonstrate correct seated posture and report corrections and use of mini breaks at work   Time 4   Period Weeks   Status Achieved           PT Long Term Goals - 11/28/15 [id number]      PT LONG TERM GOAL #1   Title be independent in advanced HEP   Time 8   Period Weeks   Status On-going     PT LONG TERM GOAL #2   Title reduce FOTO to < or = to 38% limitation   Time 8   Period Weeks   Status On-going     PT LONG TERM GOAL #3   Title report a 60% reduction in frequency and intensity of neck pain with home and work tasks   Time 8   Period Weeks   Status On-going     PT LONG TERM GOAL #4   Title play pool with 50% less Rt UT/arm pain   Time 8   Period Weeks   Status On-going               Plan - 11/28/15 [id number]    Clinical Impression Statement Pts pain is improving. Reports some change with dry needling.  Is compliant with home exercises and with self postural control at work. Will continue to increase shoulder and neck stability and increase ROM.    Rehab Potential Good   PT Frequency 2x / week   PT Duration 8 weeks   PT Treatment/Interventions ADLs/Self Care Home Management;Cryotherapy;Electrical Stimulation;Functional mobility training;Stair training;Ultrasound;Traction;Moist Heat;Patient/family education;Neuromuscular re-education;Therapeutic exercise;Therapeutic activities;Manual techniques;Passive range of motion;Vasopneumatic Device;Taping;Dry needling   PT Next Visit Plan Manual/dry needling to neck and UT, postural strength, flexiblity, modalities PRN, thoracic mobs, Scapular strength   Consulted and Agree with Plan of Care Patient      Patient will benefit from skilled therapeutic intervention in order to improve the following deficits and impairments:  Postural dysfunction, Decreased strength, Improper body mechanics, Pain, Increased muscle spasms, Decreased range of motion, Decreased activity tolerance  Visit Diagnosis: Abnormal posture  Cramp and spasm  Cervicalgia     Problem List Patient Active Problem List   Diagnosis Date Noted  . Cervical radiculopathy 10/07/2015  . 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 Nonallopathic lesion of cervical region 09/09/2013  . Nonallopathic lesion of thoracic region 09/09/2013  . Nonallopathic lesion of lumbosacral region 09/09/2013  . Abnormal findings on cytological and histological examination of urine 09/07/2013  . Lumbar radiculopathy 09/03/2013  . 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 Well adult exam 07/04/2012  . Neck mass 07/04/2012  . PSA, INCREASED 03/15/2010  . NEOPLASM, SKIN, UNCERTAIN BEHAVIOR [id number]  . SHOULDER PAIN, RIGHT 07/22/2008  . FOOT PAIN, LEFT 07/22/2008  . SKIN RASH 07/22/2008    Mikle Bosworth PTA 11/28/2015, 8:44 AM  [facility] [address] [place], [place], [address] Phone: [phone]   Fax:  [phone]  Name: Aaron Sharp MRN: [id number] Date of Birth: 23-Dec-1965

## 2015-12-02 ENCOUNTER — Ambulatory Visit: Payer: PRIVATE HEALTH INSURANCE | Attending: Family Medicine | Admitting: Physical Therapy

## 2015-12-02 DIAGNOSIS — R293 Abnormal posture: Secondary | ICD-10-CM | POA: Diagnosis not present

## 2015-12-02 DIAGNOSIS — R252 Cramp and spasm: Secondary | ICD-10-CM

## 2015-12-02 DIAGNOSIS — M542 Cervicalgia: Secondary | ICD-10-CM | POA: Insufficient documentation

## 2015-12-02 NOTE — Patient Instructions (Signed)
  Ruben Im PT [facility] [address] [place], [place] [address] Phone # [phone] Fax [phone]    Extensors, Supine   Lie supine, head on small, rolled towel. Gently tuck chin and bring toward chest. Hold _3__ seconds. Repeat __10_ times per session. Do _5__ sessions per day.  Copyright  VHI. All rights reserved.  Flexibility: Neck Retraction   Pull head straight back, keeping eyes and jaw level. Repeat ___10_ times per set. Do _1___ sets per session. Do __5__ sessions per day.  http://orth.exer.us/344   Copyright  VHI. All rights reserved.

## 2015-12-02 NOTE — Therapy (Signed)
[facility] [address] [place], [place], [address] Phone: [phone]   Fax:  [phone]  Physical Therapy Treatment  Patient Details  Name: Aaron Sharp MRN: [id number] Date of Birth: 17-Aug-1965 Referring Provider: Hulan Saas, MD  Encounter Date: 12/02/2015      PT End of Session - 12/02/15 0904    Visit Number 6   Date for PT Re-Evaluation 01/05/16   PT Start Time 0810   PT Stop Time 0908   PT Time Calculation (min) 58 min   Activity Tolerance Patient tolerated treatment well      Past Medical History:  Diagnosis Date  . GIST (gastrointestinal stromal tumor), non-malignant   . Hiatal hernia     Past Surgical History:  Procedure Laterality Date  . SHOULDER SURGERY      There were no vitals filed for this visit.      Subjective Assessment - 12/02/15 0812    Subjective Patient arrives 10 min late.  Pretty good although it still hurts.  Trap and neck pain.  Had DN 2-3 days later will have 1 day of relief.  Doing some stretches at home.  No scapular strengthening at home.    Currently in Pain? Yes   Pain Score 6    Pain Location Neck   Pain Orientation Right   Pain Type Chronic pain   Aggravating Factors  activity                Decreased left sidebending ROM.           [facility] Adult PT Treatment/Exercise - 12/02/15 0001      Neck Exercises: Seated   Other Seated Exercise thoracic ext over ball 10x   Other Seated Exercise cervical extension 10x     Neck Exercises: Supine   Neck Retraction 10 reps     Shoulder Exercises: Prone   Extension AROM;Right;10 reps   Horizontal ABduction 1 AROM;Right;10 reps   Horizontal ABduction 2 Strengthening;Right;10 reps     Shoulder Exercises: Standing   Other Standing Exercises wall push ups 10x     Shoulder Exercises: ROM/Strengthening   UBE (Upper Arm Bike) 6 min sitting on blue ball     Moist Heat Therapy   Number Minutes Moist  Heat 15 Minutes   Moist Heat Location Cervical     Electrical Stimulation   Electrical Stimulation Location right cervical    Electrical Stimulation Action IFC   Electrical Stimulation Parameters 11 ma 15 min   Electrical Stimulation Goals Pain     Manual Therapy   McConnell upper trap inhibition                PT Education - 12/02/15 0855    Education provided Yes   Education Details cervical retractions and extensions;  prone Is,YsTs   Person(s) Educated Patient   Methods Explanation;Demonstration;Handout   Comprehension Verbalized understanding;Returned demonstration          PT Short Term Goals - 12/02/15 0918      PT SHORT TERM GOAL #1   Title be independent in initial HEP   Status Achieved     PT SHORT TERM GOAL #2   Title report a 30% reduction in neck pain with work and home tasks   Time 4   Period Weeks   Status On-going     PT SHORT TERM GOAL #3   Title verbalize and demonstrate correct seated posture and report corrections and use of mini breaks at  work   Status Achieved           PT Long Term Goals - 12/02/15 0919      PT LONG TERM GOAL #1   Title be independent in advanced HEP   Time 8   Period Weeks   Status On-going     PT LONG TERM GOAL #2   Title reduce FOTO to < or = to 38% limitation   Time 8   Period Weeks   Status On-going     PT LONG TERM GOAL #3   Title report a 60% reduction in frequency and intensity of neck pain with home and work tasks   Time 8   Period Weeks   Status On-going     PT LONG TERM GOAL #4   Title play pool with 50% less Rt UT/arm pain   Time 8   Period Weeks   Status On-going               Plan - 12/02/15 0904    Clinical Impression Statement The patient has excessive scapular elevation and upper trap compensatory shrug with elevation of arms overhead.  These abnormal movement patterns contribute to chronic pain.  The patient reports short tern relief from dry needling but defers today on  this interventions.  Tactile cues with scapular stabilization ex.  Good pain relief with e-stim/heat and taping.     PT Next Visit Plan Manual/dry needling to neck and UT, postural strength, flexiblity, modalities PRN, thoracic mobs, Scapular strength;  assess response to upper trap inhibition taping and cervical retractions and extensions      Patient will benefit from skilled therapeutic intervention in order to improve the following deficits and impairments:     Visit Diagnosis: Abnormal posture  Cramp and spasm  Cervicalgia     Problem List Patient Active Problem List   Diagnosis Date Noted  . Cervical radiculopathy 10/07/2015  . 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 Nonallopathic lesion of cervical region 09/09/2013  . Nonallopathic lesion of thoracic region 09/09/2013  . Nonallopathic lesion of lumbosacral region 09/09/2013  . Abnormal findings on cytological and histological examination of urine 09/07/2013  . Lumbar radiculopathy 09/03/2013  . 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 Well adult exam 07/04/2012  . Neck mass 07/04/2012  . PSA, INCREASED 03/15/2010  . NEOPLASM, SKIN, UNCERTAIN BEHAVIOR [id number]  . SHOULDER PAIN, RIGHT 07/22/2008  . FOOT PAIN, LEFT 07/22/2008  . SKIN RASH 07/22/2008   Aaron Sharp, PT 12/02/15 9:21 AM Phone: [phone] Fax: [phone]  Aaron Sharp 12/02/2015, 9:20 AM  [facility] [address], [place], [place], [address] Phone: [phone]   Fax:  [phone]  Name: Aaron Sharp MRN: [id number] Date of Birth: 04-05-1965

## 2015-12-08 ENCOUNTER — Ambulatory Visit: Payer: PRIVATE HEALTH INSURANCE | Admitting: Physical Therapy

## 2015-12-13 ENCOUNTER — Encounter: Payer: Self-pay | Admitting: Physical Therapy

## 2015-12-13 ENCOUNTER — Ambulatory Visit: Payer: PRIVATE HEALTH INSURANCE | Admitting: Physical Therapy

## 2015-12-13 DIAGNOSIS — R293 Abnormal posture: Secondary | ICD-10-CM | POA: Diagnosis not present

## 2015-12-13 DIAGNOSIS — M542 Cervicalgia: Secondary | ICD-10-CM

## 2015-12-13 DIAGNOSIS — R252 Cramp and spasm: Secondary | ICD-10-CM

## 2015-12-13 NOTE — Patient Instructions (Addendum)
  PNF Strengthening: Resisted   Standing with resistive band around each hand, bring right arm up and away, thumb back. Repeat _10___ times per set. Do _2___ sets per session. Do _1-2___ sessions per day.      Resisted Horizontal Abduction: Bilateral   Sit or stand, tubing in both hands, arms out in front. Keeping arms straight, pinch shoulder blades together and stretch arms out. Repeat _10___ times per set. Do 2____ sets per session. Do _1-2___ sessions per day. *keep shoulder down*                Scapular Retraction: Elbow Flexion (Standing)   With elbows bent to 90, pinch shoulder blades together and rotate arms out, keeping elbows bent. Repeat _10___ times per set. Do _1___ sets per session. Do many____ sessions per day.    Strengthening: Resisted Extension   Hold tubing in right hand, arm forward. Pull arm back, elbow straight. Repeat _10___ times per set. Do _2___ sets per session. Do _1-2___ sessions per day.  Can place band around the front of your body and perform with both arms at the same time.  Jeanie Sewer PTA [facility] [address] [place], [place] [address] Phone # [phone] Fax [phone]

## 2015-12-13 NOTE — Therapy (Signed)
[facility] [address], [place], [place], [address] Phone: [phone]   Fax:  [phone]  Physical Therapy Treatment  Patient Details  Name: Aaron Sharp MRN: [id number] Date of Birth: 1966-02-18 Referring Provider: Hulan Saas, MD  Encounter Date: 12/13/2015      PT End of Session - 12/13/15 0818    Visit Number 7   Date for PT Re-Evaluation 01/05/16   PT Start Time 0810  Pt arrived 10 minutes late   PT Stop Time 0845   PT Time Calculation (min) 35 min   Activity Tolerance Patient tolerated treatment well   Behavior During Therapy [facility] for tasks assessed/performed      Past Medical History:  Diagnosis Date  . GIST (gastrointestinal stromal tumor), non-malignant   . Hiatal hernia     Past Surgical History:  Procedure Laterality Date  . SHOULDER SURGERY      There were no vitals filed for this visit.      Subjective Assessment - 12/13/15 0813    Subjective Pt reports no pain over the past two days. Some soreness today.    Pertinent History Cortizone injection into the Rt shoulder   Diagnostic tests x-ray: 3 months ago.  Reduced cervical lordosis.   Patient Stated Goals reduce neck pain, reduce muscle tension.     Currently in Pain? Yes   Pain Score 4    Pain Location Neck   Pain Orientation Right   Pain Descriptors / Indicators Tightness   Pain Type Chronic pain   Pain Onset More than a month ago   Pain Frequency Constant   Multiple Pain Sites No                         [facility] Adult PT Treatment/Exercise - 12/13/15 0001      Neck Exercises: Machines for Strengthening   UBE (Upper Arm Bike) Level 2 x 6 (3/3)     Neck Exercises: Supine   Other Supine Exercise supine on foam roll for decompression:  3 minutes.  Then blue theraband D2, horizontal abduction, ER and overhead flexion 2x10 each     Shoulder Exercises: Prone   Extension AROM;Right;10 reps   Horizontal  ABduction 1 AROM;Right;10 reps   Horizontal ABduction 2 Strengthening;Right;10 reps     Shoulder Exercises: Standing   Extension AROM;Strengthening;Both;20 reps;Weights  Verbal cues to push shoulders down   Row AROM;Strengthening;Both;20 reps;Weights  at lat pull   Shoulder Elevation Limitations Serratus wall slide                  PT Short Term Goals - 12/13/15 0815      PT SHORT TERM GOAL #1   Title be independent in initial HEP   Time 4   Period Weeks   Status Achieved     PT SHORT TERM GOAL #2   Title report a 30% reduction in neck pain with work and home tasks   Time 4   Period Weeks   Status On-going     PT SHORT TERM GOAL #3   Title verbalize and demonstrate correct seated posture and report corrections and use of mini breaks at work   Time 4   Period Weeks   Status Achieved           PT Long Term Goals - 12/13/15 0816      PT LONG TERM GOAL #1   Title be independent in advanced HEP  Time 8   Period Weeks   Status On-going     PT LONG TERM GOAL #2   Title reduce FOTO to < or = to 38% limitation   Time 8   Period Weeks   Status On-going     PT LONG TERM GOAL #3   Title report a 60% reduction in frequency and intensity of neck pain with home and work tasks   Time 8   Period Weeks   Status On-going     PT LONG TERM GOAL #4   Title play pool with 50% less Rt UT/arm pain   Time 8   Period Weeks   Status On-going               Plan - 12/13/15 1046    Clinical Impression Statement Pt has increased scapular eleveation and upper trap compensatory shrug with elevation. Verbal cues with all exercises to keep shoulders down. Will continue to strengthen upper back and decrease compensations.    Rehab Potential Good   PT Frequency 2x / week   PT Duration 8 weeks   PT Treatment/Interventions ADLs/Self Care Home Management;Cryotherapy;Electrical Stimulation;Functional mobility training;Stair training;Ultrasound;Traction;Moist  Heat;Patient/family education;Neuromuscular re-education;Therapeutic exercise;Therapeutic activities;Manual techniques;Passive range of motion;Vasopneumatic Device;Taping;Dry needling   PT Next Visit Plan Manual/dry needling to neck and UT, postural strength, flexiblity, modalities PRN, thoracic mobs, Scapular strength;  assess response to upper trap inhibition taping and cervical retractions and extensions   Consulted and Agree with Plan of Care Patient      Patient will benefit from skilled therapeutic intervention in order to improve the following deficits and impairments:  Postural dysfunction, Decreased strength, Improper body mechanics, Pain, Increased muscle spasms, Decreased range of motion, Decreased activity tolerance  Visit Diagnosis: Abnormal posture  Cramp and spasm  Cervicalgia     Problem List Patient Active Problem List   Diagnosis Date Noted  . Cervical radiculopathy 10/07/2015  . 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 Nonallopathic lesion of cervical region 09/09/2013  . Nonallopathic lesion of thoracic region 09/09/2013  . Nonallopathic lesion of lumbosacral region 09/09/2013  . Abnormal findings on cytological and histological examination of urine 09/07/2013  . Lumbar radiculopathy 09/03/2013  . 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 Well adult exam 07/04/2012  . Neck mass 07/04/2012  . PSA, INCREASED 03/15/2010  . NEOPLASM, SKIN, UNCERTAIN BEHAVIOR [id number]  . SHOULDER PAIN, RIGHT 07/22/2008  . FOOT PAIN, LEFT 07/22/2008  . SKIN RASH 07/22/2008    Mikle Bosworth PTA 12/13/2015, 10:57 AM  [facility] [address] [place], [place], [address] Phone: [phone]   Fax:  [phone]  Name: Aaron Sharp MRN: [id number] Date of Birth: 10/09/1965

## 2015-12-16 ENCOUNTER — Ambulatory Visit: Payer: PRIVATE HEALTH INSURANCE | Admitting: Physical Therapy

## 2015-12-16 DIAGNOSIS — R293 Abnormal posture: Secondary | ICD-10-CM | POA: Diagnosis not present

## 2015-12-16 DIAGNOSIS — M542 Cervicalgia: Secondary | ICD-10-CM

## 2015-12-16 DIAGNOSIS — R252 Cramp and spasm: Secondary | ICD-10-CM

## 2015-12-16 NOTE — Therapy (Addendum)
[facility] [address] [place], [place], [address] Phone: [phone]   Fax:  [phone]  Physical Therapy Treatment/Discharge Summary  Patient Details  Name: Aaron Sharp MRN: [id number] Date of Birth: 1965/09/17 Referring Provider: Hulan Saas, MD  Encounter Date: 12/16/2015      PT End of Session - 12/16/15 0939    Visit Number 8   Date for PT Re-Evaluation 01/05/16   PT Start Time 0812   PT Stop Time 0845   PT Time Calculation (min) 33 min      Past Medical History:  Diagnosis Date  . GIST (gastrointestinal stromal tumor), non-malignant   . Hiatal hernia     Past Surgical History:  Procedure Laterality Date  . SHOULDER SURGERY      There were no vitals filed for this visit.      Subjective Assessment - 12/16/15 0813    Subjective Arrives 12 min late.  Things going well.  Feeling better.  Overhead activity bothers.  Abduction.  One spot of focused, burning pain.  Reports no increase in pain with scapular strengthening.  I think PT is helping, pain is getting less.  Taping helped.  Doing cervical retractions.    Currently in Pain? Yes   Pain Score 4    Pain Location Neck   Pain Orientation Right   Pain Type Chronic pain                         [facility] Adult PT Treatment/Exercise - 12/16/15 0001      Neck Exercises: Machines for Strengthening   UBE (Upper Arm Bike) Level 2 x 6 (3/3)     Neck Exercises: Supine   Other Supine Exercise supine on foam roll for decompression:  3 minutes.  Then blue theraband D2, horizontal abduction, ER and overhead flexion 1x10 each     Neck Exercises: Prone   Other Prone Exercise Quadruped UE/LE/alternating 10x each     Shoulder Exercises: Standing   Flexion Strengthening;Both;10 reps   Flexion Limitations mirrow biofeedback   ABduction Strengthening;Both;10 reps   ABduction Limitations using mirror feedback   Other Standing Exercises  scaption 3# 5x 10 sec holds  mirror feedback     Shoulder Exercises: Power Warden/ranger Exercises standing pull downs/rows 25# 15x     Manual Therapy   McConnell upper trap inhibition                  PT Short Term Goals - 12/16/15 0949      PT SHORT TERM GOAL #1   Title be independent in initial HEP   Status Achieved     PT SHORT TERM GOAL #2   Title report a 30% reduction in neck pain with work and home tasks   Time 4   Period Weeks   Status On-going     PT SHORT TERM GOAL #3   Title verbalize and demonstrate correct seated posture and report corrections and use of mini breaks at work   Status Achieved           PT Long Term Goals - 12/16/15 0949      PT LONG TERM GOAL #1   Title be independent in advanced HEP   Time 8   Period Weeks   Status On-going     PT LONG TERM GOAL #2   Title reduce FOTO to < or = to 38% limitation  Time 8   Period Weeks   Status On-going     PT LONG TERM GOAL #3   Title report a 60% reduction in frequency and intensity of neck pain with home and work tasks   Time 8   Period Weeks   Status On-going     PT LONG TERM GOAL #4   Title play pool with 50% less Rt UT/arm pain   Time 8   Period Weeks   Status On-going               Plan - 12/16/15 0942    Clinical Impression Statement The patient is able to perform scapular and glenohumeral strengthening exercises with mirror biofeedback to decrease compensatory shoulder shrug.  He reports post treatment session that he does not feel his usual upper trapezius "knot" tender point and he declines the need for dry needling.  Verbal cues for thumb up above 90 degrees shoulder elevation to decrease impingement with flexion and abduction.  Should meet remaining goals within next 3 weeks.     PT Next Visit Plan Check % improvement for STG;  Manual/dry needling to neck and UT as needed, postural strength, flexibility, modalities PRN, thoracic mobs, Scapular  strength; upper trap inhibition taping and cervical retractions and extensions      Patient will benefit from skilled therapeutic intervention in order to improve the following deficits and impairments:     Visit Diagnosis: Abnormal posture  Cramp and spasm  Cervicalgia  PHYSICAL THERAPY DISCHARGE SUMMARY  Visits from Start of Care: 8  Current functional level related to goals / functional outcomes: On last visit, patient reports he is improving with pain and function.  He did not make any follow up appointments and his chart has been inactive for 7 weeks.  Will discharge from PT with partial goals met.     Remaining deficits: As above   Education / Equipment: Basic HEP Plan: Patient agrees to discharge.  Patient goals were partially met. Patient is being discharged due to not returning since the last visit.  ?????       Problem List Patient Active Problem List   Diagnosis Date Noted  . Cervical radiculopathy 10/07/2015  . 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 Nonallopathic lesion of cervical region 09/09/2013  . Nonallopathic lesion of thoracic region 09/09/2013  . Nonallopathic lesion of lumbosacral region 09/09/2013  . Abnormal findings on cytological and histological examination of urine 09/07/2013  . Lumbar radiculopathy 09/03/2013  . 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 Well adult exam 07/04/2012  . Neck mass 07/04/2012  . PSA, INCREASED 03/15/2010  . NEOPLASM, SKIN, UNCERTAIN BEHAVIOR 41/96/2229  . SHOULDER PAIN, RIGHT 07/22/2008  . FOOT PAIN, LEFT 07/22/2008  . SKIN RASH 07/22/2008   Ruben Im, PT 12/16/15 9:51 AM Phone: [phone] Fax: [phone]  [name] 12/16/2015, 9:50 AM  [facility] [address] [place], [place], [address] Phone: [phone]   Fax:  [phone]  Name: Aaron Sharp MRN: [id number] Date of Birth: [birth year]

## 2016-06-19 ENCOUNTER — Encounter: Payer: Self-pay | Admitting: Internal Medicine

## 2016-08-01 ENCOUNTER — Encounter: Payer: PRIVATE HEALTH INSURANCE | Admitting: Internal Medicine

## 2016-08-01 ENCOUNTER — Encounter: Payer: Self-pay | Admitting: Internal Medicine

## 2016-08-01 ENCOUNTER — Ambulatory Visit (INDEPENDENT_AMBULATORY_CARE_PROVIDER_SITE_OTHER): Payer: PRIVATE HEALTH INSURANCE | Admitting: Internal Medicine

## 2016-08-01 VITALS — BP 122/80 | HR 60 | Temp 98.1°F | Ht 72.0 in | Wt 203.1 lb

## 2016-08-01 DIAGNOSIS — Z0001 Encounter for general adult medical examination with abnormal findings: Secondary | ICD-10-CM

## 2016-08-01 DIAGNOSIS — R0789 Other chest pain: Secondary | ICD-10-CM | POA: Diagnosis not present

## 2016-08-01 DIAGNOSIS — Z0000 Encounter for general adult medical examination without abnormal findings: Secondary | ICD-10-CM

## 2016-08-01 MED ORDER — VITAMIN D 1000 UNITS PO TABS: 1000.0000 [IU] | ORAL_TABLET | Freq: Every day | ORAL | 11 refills | Status: DC

## 2016-08-01 NOTE — Progress Notes (Addendum)
Subjective:  Patient ID: Aaron Sharp, male    DOB: 1966-03-09  Age: 50 y.o. MRN: [id number]  CC: No chief complaint on file.   HPI Aaron Sharp presents for a well exam Pt had some R CP - resolved  Outpatient Medications Prior to Visit  Medication Sig Dispense Refill  . gabapentin (NEURONTIN) 100 MG capsule Take 2 capsules (200 mg total) by mouth at bedtime. 60 capsule 3  . ibuprofen (ADVIL,MOTRIN) 600 MG tablet Take 600 mg by mouth every 6 (six) hours as needed.      . predniSONE (DELTASONE) 50 MG tablet Take 1 tablet (50 mg total) by mouth daily. (Patient not taking: Reported on 11/10/2015) 5 tablet 0   Facility-Administered Medications Prior to Visit  Medication Dose Route Frequency Provider Last Rate Last Dose  . methylPREDNISolone acetate (DEPO-MEDROL) injection 40 mg  40 mg Intramuscular Once Aleksei [name] V, MD        ROS Review of Systems  Constitutional: Negative for appetite change, fatigue and unexpected weight change.  HENT: Negative for congestion, nosebleeds, sneezing, sore throat and trouble swallowing.   Eyes: Negative for itching and visual disturbance.  Respiratory: Negative for cough.   Cardiovascular: Negative for chest pain, palpitations and leg swelling.  Gastrointestinal: Negative for abdominal distention, blood in stool, diarrhea and nausea.  Genitourinary: Negative for frequency and hematuria.  Musculoskeletal: Negative for back pain, gait problem, joint swelling and neck pain.  Skin: Negative for rash.  Neurological: Negative for dizziness, tremors, speech difficulty and weakness.  Psychiatric/Behavioral: Negative for agitation, dysphoric mood and sleep disturbance. The patient is not nervous/anxious.     Objective:  BP 122/80 (BP Location: Left Arm, Patient Position: Sitting, Cuff Size: Normal)   Pulse 60   Temp 98.1 F (36.7 C) (Oral)   Ht 6' (1.829 m)   Wt 203 lb 1.3 oz (92.1 kg)   SpO2 100%   BMI 27.54 kg/m   BP Readings from  Last 3 Encounters:  08/01/16 122/80  10/20/15 122/78  10/07/15 124/80    Wt Readings from Last 3 Encounters:  08/01/16 203 lb 1.3 oz (92.1 kg)  10/20/15 199 lb (90.3 kg)  10/07/15 199 lb (90.3 kg)    Physical Exam  Constitutional: He is oriented to person, place, and time. He appears well-developed. No distress.  NAD  HENT:  Mouth/Throat: Oropharynx is clear and moist.  Eyes: Conjunctivae are normal. Pupils are equal, round, and reactive to light.  Neck: Normal range of motion. No JVD present. No thyromegaly present.  Cardiovascular: Normal rate, regular rhythm, normal heart sounds and intact distal pulses.  Exam reveals no gallop and no friction rub.   No murmur heard. Pulmonary/Chest: Effort normal and breath sounds normal. No respiratory distress. He has no wheezes. He has no rales. He exhibits no tenderness.  Abdominal: Soft. Bowel sounds are normal. He exhibits no distension and no mass. There is no tenderness. There is no rebound and no guarding.  Musculoskeletal: Normal range of motion. He exhibits no edema or tenderness.  Lymphadenopathy:    He has no cervical adenopathy.  Neurological: He is alert and oriented to person, place, and time. He has normal reflexes. No cranial nerve deficit. He exhibits normal muscle tone. He displays a negative Romberg sign. Coordination and gait normal.  Skin: Skin is warm and dry. No rash noted.  Psychiatric: He has a normal mood and affect. His behavior is normal. Judgment and thought content normal.  Pt declined rectal/gen exam Moles  Procedure:  EKG Indication: well exam; atypical chest pain Impression: S brady. No acute changes.   Lab Results  Component Value Date   WBC 6.2 08/23/2015   HGB 15.2 08/23/2015   HCT 43.8 08/23/2015   PLT 191.0 08/23/2015   GLUCOSE 92 08/23/2015   CHOL 215 (H) 08/23/2015   TRIG 193.0 (H) 08/23/2015   HDL 35.10 (L) 08/23/2015   LDLCALC 141 (H) 08/23/2015   ALT 18 08/23/2015   AST 18 08/23/2015    NA 138 08/23/2015   K 4.3 08/23/2015   CL 105 08/23/2015   CREATININE 1.11 08/23/2015   BUN 15 08/23/2015   CO2 27 08/23/2015   TSH 3.77 08/23/2015   PSA 2.20 08/23/2015   INR 1.09 11/17/2009    Dg Cervical Spine Complete  Result Date: 09/22/2015 CLINICAL DATA:  Neck and right shoulder pain over the last 8 years, no injury EXAM: CERVICAL SPINE - COMPLETE 4+ VIEW COMPARISON:  CT of the neck of 07/09/2012 FINDINGS: The cervical vertebrae remain slightly straightened in alignment. Intervertebral disc spaces appear normal. No prevertebral soft tissue swelling is seen. On oblique views, the foramina are patent. The odontoid process is intact. The lung apices are clear. IMPRESSION: Straightened alignment. Normal intervertebral disc spaces. No foraminal narrowing. Electronically Signed   By: Ivar Drape M.D.   On: 09/22/2015 16:08    Assessment & Plan:   There are no diagnoses linked to this encounter. I have discontinued Mr. Helt ibuprofen, predniSONE, and gabapentin. We will continue to administer methylPREDNISolone acetate.  No orders of the defined types were placed in this encounter.    Follow-up: No Follow-up on file.  Walker Kehr, MD

## 2016-08-01 NOTE — Progress Notes (Signed)
Pre visit review using our clinic review tool, if applicable. No additional management support is needed unless otherwise documented below in the visit note. 

## 2016-08-01 NOTE — Assessment & Plan Note (Addendum)
We discussed age appropriate health related issues, including available/recomended screening tests and vaccinations. We discussed a need for adhering to healthy diet and exercise. Labs/EKG were reviewed/ordered. All questions were answered. Yearly skin exams by Derm

## 2016-08-01 NOTE — Assessment & Plan Note (Signed)
EKG

## 2016-08-03 ENCOUNTER — Other Ambulatory Visit (INDEPENDENT_AMBULATORY_CARE_PROVIDER_SITE_OTHER): Payer: PRIVATE HEALTH INSURANCE

## 2016-08-03 DIAGNOSIS — Z0000 Encounter for general adult medical examination without abnormal findings: Secondary | ICD-10-CM | POA: Diagnosis not present

## 2016-08-03 DIAGNOSIS — R7989 Other specified abnormal findings of blood chemistry: Secondary | ICD-10-CM

## 2016-08-03 LAB — BASIC METABOLIC PANEL
BUN: 14 mg/dL (ref 6–23)
CHLORIDE: 103 meq/L (ref 96–112)
CO2: 27 meq/L (ref 19–32)
Calcium: 9.6 mg/dL (ref 8.4–10.5)
Creatinine, Ser: 1.12 mg/dL (ref 0.40–1.50)
GFR: 74.17 mL/min (ref >60.00)
GLUCOSE: 89 mg/dL (ref 70–99)
POTASSIUM: 4.5 meq/L (ref 3.5–5.1)
Sodium: 137 mEq/L (ref 135–145)

## 2016-08-03 LAB — HEPATIC FUNCTION PANEL
ALT: 36 U/L (ref 0–53)
AST: 29 U/L (ref 0–37)
Albumin: 4.5 g/dL (ref 3.5–5.2)
Alkaline Phosphatase: 62 U/L (ref 39–117)
BILIRUBIN TOTAL: 0.6 mg/dL (ref 0.2–1.2)
Bilirubin, Direct: 0.1 mg/dL (ref 0.0–0.3)
Total Protein: 7 g/dL (ref 6.0–8.3)

## 2016-08-03 LAB — CBC WITH DIFFERENTIAL/PLATELET
BASOS PCT: 0.4 % (ref 0.0–3.0)
Basophils Absolute: 0 10*3/uL (ref 0.0–0.1)
EOS PCT: 1.3 % (ref 0.0–5.0)
Eosinophils Absolute: 0.1 10*3/uL (ref 0.0–0.7)
HCT: 43.1 % (ref 39.0–52.0)
HEMOGLOBIN: 14.7 g/dL (ref 13.0–17.0)
LYMPHS ABS: 1.8 10*3/uL (ref 0.7–4.0)
Lymphocytes Relative: 35.2 % (ref 12.0–46.0)
MCHC: 34.1 g/dL (ref 30.0–36.0)
MCV: 92.7 fl (ref 78.0–100.0)
MONO ABS: 0.6 10*3/uL (ref 0.1–1.0)
Monocytes Relative: 10.7 % (ref 3.0–12.0)
NEUTROS ABS: 2.8 10*3/uL (ref 1.4–7.7)
Neutrophils Relative %: 52.4 % (ref 43.0–77.0)
PLATELETS: 171 10*3/uL (ref 150.0–400.0)
RBC: 4.65 Mil/uL (ref 4.22–5.81)
RDW: 12.6 % (ref 11.5–15.5)
WBC: 5.3 10*3/uL (ref 4.0–10.5)

## 2016-08-03 LAB — PSA: PSA: 2.13 ng/mL (ref 0.10–4.00)

## 2016-08-03 LAB — URINALYSIS
Bilirubin Urine: NEGATIVE
HGB URINE DIPSTICK: NEGATIVE
Ketones, ur: NEGATIVE
LEUKOCYTES UA: NEGATIVE
NITRITE: NEGATIVE
Specific Gravity, Urine: 1.015 (ref 1.000–1.030)
Total Protein, Urine: NEGATIVE
UROBILINOGEN UA: 0.2 (ref 0.0–1.0)
Urine Glucose: NEGATIVE
pH: 6 (ref 5.0–8.0)

## 2016-08-03 LAB — LIPID PANEL
CHOLESTEROL: 221 mg/dL — AB (ref 0–200)
HDL: 44.2 mg/dL (ref >39.00)
NonHDL: 176.3
Total CHOL/HDL Ratio: 5
Triglycerides: 230 mg/dL — ABNORMAL HIGH (ref 0.0–149.0)
VLDL: 46 mg/dL — AB (ref 0.0–40.0)

## 2016-08-03 LAB — TSH: TSH: 6.77 u[IU]/mL — AB (ref 0.35–4.50)

## 2016-08-03 LAB — LDL CHOLESTEROL, DIRECT: LDL DIRECT: 125 mg/dL

## 2016-08-05 ENCOUNTER — Other Ambulatory Visit: Payer: Self-pay | Admitting: Internal Medicine

## 2016-08-05 DIAGNOSIS — R7989 Other specified abnormal findings of blood chemistry: Secondary | ICD-10-CM

## 2016-09-11 ENCOUNTER — Other Ambulatory Visit (INDEPENDENT_AMBULATORY_CARE_PROVIDER_SITE_OTHER): Payer: PRIVATE HEALTH INSURANCE

## 2016-09-11 DIAGNOSIS — R946 Abnormal results of thyroid function studies: Secondary | ICD-10-CM | POA: Diagnosis not present

## 2016-09-11 DIAGNOSIS — R7989 Other specified abnormal findings of blood chemistry: Secondary | ICD-10-CM

## 2016-09-11 LAB — T4, FREE: Free T4: 0.75 ng/dL (ref 0.60–1.60)

## 2016-09-11 LAB — TSH: TSH: 5.9 u[IU]/mL — ABNORMAL HIGH (ref 0.35–4.50)

## 2017-01-28 ENCOUNTER — Encounter: Payer: Self-pay | Admitting: Internal Medicine

## 2017-01-28 ENCOUNTER — Ambulatory Visit (INDEPENDENT_AMBULATORY_CARE_PROVIDER_SITE_OTHER): Payer: PRIVATE HEALTH INSURANCE | Admitting: Internal Medicine

## 2017-01-28 VITALS — BP 120/84 | HR 66 | Temp 98.2°F | Ht 72.0 in | Wt 194.0 lb

## 2017-01-28 DIAGNOSIS — Z23 Encounter for immunization: Secondary | ICD-10-CM | POA: Diagnosis not present

## 2017-01-28 DIAGNOSIS — A09 Infectious gastroenteritis and colitis, unspecified: Secondary | ICD-10-CM

## 2017-01-28 MED ORDER — AZITHROMYCIN 250 MG PO TABS: ORAL_TABLET | ORAL | 0 refills | Status: DC

## 2017-01-28 NOTE — Assessment & Plan Note (Signed)
?  traveller's diarrhea -  came back from [place] 2 weeks ago. Imodium Rx Zithromax Flagyl if not better

## 2017-01-28 NOTE — Progress Notes (Signed)
Subjective:  Patient ID: Aaron Sharp, male    DOB: June 28, 1965  Age: 51 y.o. MRN: [id number]  CC: Diarrhea   HPI Aaron Sharp presents for watery diarrhea x 1 week x5-6 a day after meals. He came back from [place] 2 weeks ago.  Outpatient Medications Prior to Visit  Medication Sig Dispense Refill  . cholecalciferol (VITAMIN D) 1000 units tablet Take 1 tablet (1,000 Units total) by mouth daily. 30 tablet 11   Facility-Administered Medications Prior to Visit  Medication Dose Route Frequency Provider Last Rate Last Dose  . methylPREDNISolone acetate (DEPO-MEDROL) injection 40 mg  40 mg Intramuscular Once Slade Pierpoint, [name], MD        ROS Review of Systems  Constitutional: Negative for appetite change, fatigue and unexpected weight change.  HENT: Negative for congestion, nosebleeds, sneezing, sore throat and trouble swallowing.   Eyes: Negative for itching and visual disturbance.  Respiratory: Negative for cough.   Cardiovascular: Negative for chest pain, palpitations and leg swelling.  Gastrointestinal: Negative for abdominal distention, blood in stool, diarrhea and nausea.  Genitourinary: Negative for frequency and hematuria.  Musculoskeletal: Negative for back pain, gait problem, joint swelling and neck pain.  Skin: Negative for rash.  Neurological: Negative for dizziness, tremors, speech difficulty and weakness.  Psychiatric/Behavioral: Negative for agitation, dysphoric mood, sleep disturbance and suicidal ideas. The patient is not nervous/anxious.     Objective:  BP 120/84 (BP Location: Left Arm, Patient Position: Sitting, Cuff Size: Large)   Pulse 66   Temp 98.2 F (36.8 C) (Oral)   Ht 6' (1.829 m)   Wt 194 lb (88 kg)   SpO2 98%   BMI 26.31 kg/m   BP Readings from Last 3 Encounters:  01/28/17 120/84  08/01/16 122/80  10/20/15 122/78    Wt Readings from Last 3 Encounters:  01/28/17 194 lb (88 kg)  08/01/16 203 lb 1.3 oz (92.1 kg)  10/20/15 199 lb (90.3  kg)    Physical Exam  Constitutional: He is oriented to person, place, and time. He appears well-developed. No distress.  NAD  HENT:  Mouth/Throat: Oropharynx is clear and moist.  Eyes: Pupils are equal, round, and reactive to light. Conjunctivae are normal.  Neck: Normal range of motion. No JVD present. No thyromegaly present.  Cardiovascular: Normal rate, regular rhythm, normal heart sounds and intact distal pulses.  Exam reveals no gallop and no friction rub.   No murmur heard. Pulmonary/Chest: Effort normal and breath sounds normal. No respiratory distress. He has no wheezes. He has no rales. He exhibits no tenderness.  Abdominal: Soft. Bowel sounds are normal. He exhibits no distension and no mass. There is no tenderness. There is no rebound and no guarding.  Musculoskeletal: Normal range of motion. He exhibits no edema or tenderness.  Lymphadenopathy:    He has no cervical adenopathy.  Neurological: He is alert and oriented to person, place, and time. He has normal reflexes. No cranial nerve deficit. He exhibits normal muscle tone. He displays a negative Romberg sign. Coordination and gait normal.  Skin: Skin is warm and dry. No rash noted.  Psychiatric: He has a normal mood and affect. His behavior is normal. Judgment and thought content normal.    Lab Results  Component Value Date   WBC 5.3 08/03/2016   HGB 14.7 08/03/2016   HCT 43.1 08/03/2016   PLT 171.0 08/03/2016   GLUCOSE 89 08/03/2016   CHOL 221 (H) 08/03/2016   TRIG 230.0 (H) 08/03/2016   HDL 44.20 08/03/2016  LDLDIRECT 125.0 08/03/2016   LDLCALC 141 (H) 08/23/2015   ALT 36 08/03/2016   AST 29 08/03/2016   NA 137 08/03/2016   K 4.5 08/03/2016   CL 103 08/03/2016   CREATININE 1.12 08/03/2016   BUN 14 08/03/2016   CO2 27 08/03/2016   TSH 5.90 (H) 09/11/2016   PSA 2.13 08/03/2016   INR 1.09 11/17/2009    Dg Cervical Spine Complete  Result Date: 09/22/2015 CLINICAL DATA:  Neck and right shoulder pain over  the last 8 years, no injury EXAM: CERVICAL SPINE - COMPLETE 4+ VIEW COMPARISON:  CT of the neck of 07/09/2012 FINDINGS: The cervical vertebrae remain slightly straightened in alignment. Intervertebral disc spaces appear normal. No prevertebral soft tissue swelling is seen. On oblique views, the foramina are patent. The odontoid process is intact. The lung apices are clear. IMPRESSION: Straightened alignment. Normal intervertebral disc spaces. No foraminal narrowing. Electronically Signed   By: Ivar Drape M.D.   On: 09/22/2015 16:08    Assessment & Plan:   There are no diagnoses linked to this encounter. I have discontinued Mr. Ing cholecalciferol. We will continue to administer methylPREDNISolone acetate.  No orders of the defined types were placed in this encounter.    Follow-up: No Follow-up on file.  Walker Kehr, MD

## 2017-01-28 NOTE — Patient Instructions (Addendum)
Take Imodium Diarrhea, Adult Diarrhea is frequent loose and watery bowel movements. Diarrhea can make you feel weak and cause you to become dehydrated. Dehydration can make you tired and thirsty, cause you to have a dry mouth, and decrease how often you urinate. Diarrhea typically lasts 2-3 days. However, it can last longer if it is a sign of something more serious. It is important to treat your diarrhea as told by your health care provider. Follow these instructions at home: Eating and drinking  Follow these recommendations as told by your health care provider:  Take an oral rehydration solution (ORS). This is a drink that is sold at pharmacies and retail stores.  Drink clear fluids, such as water, ice chips, diluted fruit juice, and low-calorie sports drinks.  Eat bland, easy-to-digest foods in small amounts as you are able. These foods include bananas, applesauce, rice, lean meats, toast, and crackers.  Avoid drinking fluids that contain a lot of sugar or caffeine, such as energy drinks, sports drinks, and soda.  Avoid alcohol.  Avoid spicy or fatty foods.  General instructions  Drink enough fluid to keep your urine clear or pale yellow.  Wash your hands often. If soap and water are not available, use hand sanitizer.  Make sure that all people in your household wash their hands well and often.  Take over-the-counter and prescription medicines only as told by your health care provider.  Rest at home while you recover.  Watch your condition for any changes.  Take a warm bath to relieve any burning or pain from frequent diarrhea episodes.  Keep all follow-up visits as told by your health care provider. This is important. Contact a health care provider if:  You have a fever.  Your diarrhea gets worse.  You have new symptoms.  You cannot keep fluids down.  You feel light-headed or dizzy.  You have a headache  You have muscle cramps. Get help right away if:  You  have chest pain.  You feel extremely weak or you faint.  You have bloody or black stools or stools that look like tar.  You have severe pain, cramping, or bloating in your abdomen.  You have trouble breathing or you are breathing very quickly.  Your heart is beating very quickly.  Your skin feels cold and clammy.  You feel confused.  You have signs of dehydration, such as: ? Dark urine, very little urine, or no urine. ? Cracked lips. ? Dry mouth. ? Sunken eyes. ? Sleepiness. ? Weakness. This information is not intended to replace advice given to you by your health care provider. Make sure you discuss any questions you have with your health care provider. Document Released: 03/09/2002 Document Revised: 07/28/2015 Document Reviewed: 11/23/2014 Elsevier Interactive Patient Education  2017 [employer].

## 2017-01-29 ENCOUNTER — Other Ambulatory Visit: Payer: PRIVATE HEALTH INSURANCE

## 2017-01-29 DIAGNOSIS — A09 Infectious gastroenteritis and colitis, unspecified: Secondary | ICD-10-CM

## 2017-01-30 ENCOUNTER — Encounter: Payer: Self-pay | Admitting: Internal Medicine

## 2017-02-04 ENCOUNTER — Encounter: Payer: Self-pay | Admitting: Internal Medicine

## 2017-02-04 LAB — GIARDIA/CRYPTOSPORIDIUM (EIA)
MICRO NUMBER: 81215126
MICRO NUMBER:: 81215125
RESULT:: NOT DETECTED
RESULT:: NOT DETECTED
SPECIMEN QUALITY: ADEQUATE
SPECIMEN QUALITY:: ADEQUATE

## 2017-04-15 ENCOUNTER — Ambulatory Visit (INDEPENDENT_AMBULATORY_CARE_PROVIDER_SITE_OTHER)
Admission: RE | Admit: 2017-04-15 | Discharge: 2017-04-15 | Disposition: A | Discharge status: Home / Self Care | Payer: PRIVATE HEALTH INSURANCE | Source: Ambulatory Visit | Attending: Family Medicine | Admitting: Family Medicine

## 2017-04-15 ENCOUNTER — Ambulatory Visit: Payer: PRIVATE HEALTH INSURANCE | Admitting: Family Medicine

## 2017-04-15 ENCOUNTER — Encounter: Payer: Self-pay | Admitting: Family Medicine

## 2017-04-15 VITALS — BP 110/82 | HR 68 | Ht 71.75 in | Wt 196.0 lb

## 2017-04-15 DIAGNOSIS — M5416 Radiculopathy, lumbar region: Secondary | ICD-10-CM

## 2017-04-15 MED ORDER — METHYLPREDNISOLONE ACETATE 80 MG/ML IJ SUSP
80.0000 mg | Freq: Once | INTRAMUSCULAR | Status: AC
  Administered 2017-04-15: 80 mg via INTRAMUSCULAR

## 2017-04-15 MED ORDER — KETOROLAC TROMETHAMINE 60 MG/2ML IM SOLN
60.0000 mg | Freq: Once | INTRAMUSCULAR | Status: AC
  Administered 2017-04-15: 60 mg via INTRAMUSCULAR

## 2017-04-15 MED ORDER — PREDNISONE 50 MG PO TABS: 50.0000 mg | ORAL_TABLET | Freq: Every day | ORAL | 0 refills | Status: DC

## 2017-04-15 NOTE — Assessment & Plan Note (Signed)
Patient is having more of a lumbar radiculopathy.  Has had an exacerbation previously.  Given injections today.  Prednisone given as well.  We discussed icing regimen and home exercises.  We discussed which activities to do which wants to avoid.  Prednisone for 5 days and follow-up with me again in 1 week.  Possibly start manipulation again.

## 2017-04-15 NOTE — Progress Notes (Signed)
[facility] [address] [place], [place] [address] Phone: [phone] Subjective:    I'm seeing this patient by the request  of:    CC: Back pain  [id number]  Aaron Sharp is a 52 y.o. male coming in for low back pain that started 3 weeks ago. He picked up his step-daughter and may have hurt his back then. He has traveled a lot since then carrying luggage which seemed to aggravate his pain. One week ago his pain became more intense. He said that he was unable to put pressure on his left leg when he woke up. He saw a physical therapist in [place]. Most of his pain is on the left side of his back. He has had some tingling down the back of the leg. He notes some soreness in the posterior leg. Has had no prior incidences like this one.  Noticed weakness as well as of the left foot initially.  States that slowly getting better.  Still significant tightness.  Patient denies though any constant numbness.  Patient states that it seems to hurt more when flexing then straightening up.  Find it difficult to find a comfortable position at night but once he fell asleep he did well.       Past Medical History:  Diagnosis Date  . GIST (gastrointestinal stromal tumor), non-malignant   . Hiatal hernia    Past Surgical History:  Procedure Laterality Date  . SHOULDER SURGERY     Social History   Socioeconomic History  . Marital status: Married    Spouse name: None  . Number of children: 2  . Years of education: None  . Highest education level: None  Social Needs  . Financial resource strain: None  . Food insecurity - worry: None  . Food insecurity - inability: None  . Transportation needs - medical: None  . Transportation needs - non-medical: None  Occupational History  . Occupation: Data processing manager: [employer]  Tobacco Use  . Smoking status: Never Smoker  . Smokeless tobacco: Never Used  Substance and Sexual Activity  .  Alcohol use: Yes  . Drug use: No  . Sexual activity: Yes  Other Topics Concern  . None  Social History Narrative  . None   No Known Allergies Family History  Problem Relation Age of Onset  . Depression Mother   . Colon cancer Paternal Grandfather   . Prostate cancer Unknown   . Hypertension Daughter      Past medical history, social, surgical and family history all reviewed in electronic medical record.  No pertanent information unless stated regarding to the chief complaint.   Review of Systems:Review of systems updated and as accurate as of 04/15/17  No headache, visual changes, nausea, vomiting, diarrhea, constipation, dizziness, abdominal pain, skin rash, fevers, chills, night sweats, weight loss, swollen lymph nodes, body aches, joint swelling, chest pain, shortness of breath, mood changes.  Positive muscle aches  Objective  Blood pressure 110/82, pulse 68, height 5' 11.75" (1.822 m), weight 196 lb (88.9 kg), SpO2 98 %. Systems examined below as of 04/15/17   General: No apparent distress alert and oriented x3 mood and affect normal, dressed appropriately.  HEENT: Pupils equal, extraocular movements intact  Respiratory: Patient's speak in full sentences and does not appear short of breath  Cardiovascular: No lower extremity edema, non tender, no erythema  Skin: Warm dry intact with no signs of infection or rash on extremities or  on axial skeleton.  Abdomen: Soft nontender  Neuro: Cranial nerves II through XII are intact, neurovascularly intact in all extremities with 2+ DTRs and 2+ pulses.  Lymph: No lymphadenopathy of posterior or anterior cervical chain or axillae bilaterally.  Gait normal with good balance and coordination.  MSK:  Non tender with full range of motion and good stability and symmetric strength and tone of shoulders, elbows, wrist, hip, knee and ankles bilaterally.  Back Exam:  Inspection: Mild loss of lordosis Motion: Flexion 20 deg with worsening  symptoms, Extension 25 deg, Side Bending to 35 deg bilaterally,  Rotation to 35 deg bilaterally  SLR laying: Positive on the left XSLR laying: Negative  Palpable tenderness: Tender to palpation in the paraspinal musculature. FABER: Tightness bilaterally. Sensory change: Gross sensation intact to all lumbar and sacral dermatomes.  Reflexes: 2+ at both patellar tendons, 2+ at achilles tendons, Babinski's downgoing.  Strength at foot  4 out of 5 strength of the left foot in dorsiflexion compared to the contralateral side Impression and Recommendations:     This case required medical decision making of moderate complexity.      Note: This dictation was prepared with Dragon dictation along with smaller phrase technology. Any transcriptional errors that result from this process are unintentional.

## 2017-04-15 NOTE — Patient Instructions (Addendum)
Good to see you  Prednisone daily for 5 days  2 injections today  Heat 10 minutes then ice 1o minutes then off for 2 hours and repeat  See me again in 1 week (ok to double book)

## 2017-04-22 NOTE — Progress Notes (Signed)
[name] [address] [place], [place] [address] Phone: [phone] Subjective:    I'm seeing this patient by the request  of:    CC: Back pain follow-up  [id number]  Aaron Sharp is a 52 y.o. male coming in with complaint of low back pain.  Patient was found to have more of a lumbar radiculopathy.  Was having significant wheezing symptoms and patient was given home exercises, icing regimen, started prednisone as well.  Patient states that his back has been doing a lot better. He feels the prednisone and stretching helped.      Past Medical History:  Diagnosis Date  . GIST (gastrointestinal stromal tumor), non-malignant   . Hiatal hernia    Past Surgical History:  Procedure Laterality Date  . SHOULDER SURGERY     Social History   Socioeconomic History  . Marital status: Married    Spouse name: None  . Number of children: 2  . Years of education: None  . Highest education level: None  Social Needs  . Financial resource strain: None  . Food insecurity - worry: None  . Food insecurity - inability: None  . Transportation needs - medical: None  . Transportation needs - non-medical: None  Occupational History  . Occupation: Data processing manager: [employer]  Tobacco Use  . Smoking status: Never Smoker  . Smokeless tobacco: Never Used  Substance and Sexual Activity  . Alcohol use: Yes  . Drug use: No  . Sexual activity: Yes  Other Topics Concern  . None  Social History Narrative  . None   No Known Allergies Family History  Problem Relation Age of Onset  . Depression Mother   . Colon cancer Paternal Grandfather   . Prostate cancer Unknown   . Hypertension Daughter      Past medical history, social, surgical and family history all reviewed in electronic medical record.  No pertanent information unless stated regarding to the chief complaint.   Review of Systems:Review of systems updated and as accurate  as of 04/23/17  No headache, visual changes, nausea, vomiting, diarrhea, constipation, dizziness, abdominal pain, skin rash, fevers, chills, night sweats, weight loss, swollen lymph nodes, body aches, joint swelling, muscle aches, chest pain, shortness of breath, mood changes.   Objective  Blood pressure 124/82, pulse 64, height 6' (1.829 m), weight 199 lb (90.3 kg), SpO2 98 %. Systems examined below as of 04/23/17   General: No apparent distress alert and oriented x3 mood and affect normal, dressed appropriately.  HEENT: Pupils equal, extraocular movements intact  Respiratory: Patient's speak in full sentences and does not appear short of breath  Cardiovascular: No lower extremity edema, non tender, no erythema  Skin: Warm dry intact with no signs of infection or rash on extremities or on axial skeleton.  Abdomen: Soft nontender  Neuro: Cranial nerves II through XII are intact, neurovascularly intact in all extremities with 2+ DTRs and 2+ pulses.  Lymph: No lymphadenopathy of posterior or anterior cervical chain or axillae bilaterally.  Gait normal with good balance and coordination.  MSK:  Non tender with full range of motion and good stability and symmetric strength and tone of shoulders, elbows, wrist, hip, knee and ankles bilaterally.  Back Exam:  Inspection: Unremarkable  Motion: Flexion 35 deg, Extension 25 deg, Side Bending to 45 deg bilaterally,  Rotation to 40 deg bilaterally  SLR laying: Negative still tightness in the right side but no radicular symptoms XSLR  laying: Negative  Palpable tenderness: Tenderness in the paraspinal musculature lumbar spine diffusely.Marland Kitchen FABER: negative. Sensory change: Gross sensation intact to all lumbar and sacral dermatomes.  Reflexes: 2+ at both patellar tendons, 2+ at achilles tendons, Babinski's downgoing.  Strength at foot  Plantar-flexion: 5/5 Dorsi-flexion: 5/5 Eversion: 5/5 Inversion: 5/5  Leg strength  Quad: 5/5 Hamstring: 5/5 Hip flexor:  5/5 Hip abductors: 5/5  Gait unremarkable.  Osteopathic findings C2 flexed rotated and side bent right C4 flexed rotated and side bent left C7 flexed rotated and side bent left T3 extended rotated and side bent right inhaled third rib T5 extended rotated and side bent left L3 flexed rotated and side bent right Sacrum right on right    Impression and Recommendations:     This case required medical decision making of moderate complexity.      Note: This dictation was prepared with Dragon dictation along with smaller phrase technology. Any transcriptional errors that result from this process are unintentional.

## 2017-04-23 ENCOUNTER — Encounter: Payer: Self-pay | Admitting: Family Medicine

## 2017-04-23 ENCOUNTER — Ambulatory Visit (INDEPENDENT_AMBULATORY_CARE_PROVIDER_SITE_OTHER): Payer: PRIVATE HEALTH INSURANCE | Admitting: Family Medicine

## 2017-04-23 VITALS — BP 124/82 | HR 64 | Ht 72.0 in | Wt 199.0 lb

## 2017-04-23 DIAGNOSIS — M999 Biomechanical lesion, unspecified: Secondary | ICD-10-CM | POA: Diagnosis not present

## 2017-04-23 DIAGNOSIS — M5416 Radiculopathy, lumbar region: Secondary | ICD-10-CM

## 2017-04-23 NOTE — Patient Instructions (Signed)
Good to se eyou  Overall I am happy  Lets be careful with the low back  Exercises 3 times a week.  OK to increase activity slowly.  Call me if worsening but otherwise see me again in 3 weeks

## 2017-04-23 NOTE — Assessment & Plan Note (Signed)
Decision today to treat with OMT was based on Physical Exam  After verbal consent patient was treated with HVLA, ME, FPR techniques in cervical, thoracic, lumbar and sacral areas  Patient tolerated the procedure well with improvement in symptoms  Patient given exercises, stretches and lifestyle modifications  See medications in patient instructions if given  Patient will follow up in 2-4 weeks

## 2017-04-23 NOTE — Assessment & Plan Note (Signed)
Patient is concerned for more of a lumbar radiculopathy.  Not having as much pain as previously.  We discussed icing regimen and home exercises.  Discussed which activities of doing which wants to avoid.  Patient was to increase activity slowly over the course the next several days.  Patient did respond well to osteopathic manipulation.  Worsening follow-up again in 2-4 weeks

## 2017-05-15 NOTE — Progress Notes (Signed)
[name] [address] [place], [place] [address] Phone: [phone] Subjective:    I'm seeing this patient by the request  of:    CC: Neck pain follow-up  [id number]  Aaron Sharp is a 52 y.o. male coming in with complaint of neck and back pain follow-up.  Found to have more of a lumbar radiculopathy.  Has responded well to osteopathic manipulation.  Patient states that he is doing better. He has had a few episodes of pain he said since last visit but cannot identify the movements that caused him pain.         Past Medical History:  Diagnosis Date  . GIST (gastrointestinal stromal tumor), non-malignant   . Hiatal hernia    Past Surgical History:  Procedure Laterality Date  . SHOULDER SURGERY     Social History   Socioeconomic History  . Marital status: Married    Spouse name: None  . Number of children: 2  . Years of education: None  . Highest education level: None  Social Needs  . Financial resource strain: None  . Food insecurity - worry: None  . Food insecurity - inability: None  . Transportation needs - medical: None  . Transportation needs - non-medical: None  Occupational History  . Occupation: Data processing manager: [employer]  Tobacco Use  . Smoking status: Never Smoker  . Smokeless tobacco: Never Used  Substance and Sexual Activity  . Alcohol use: Yes  . Drug use: No  . Sexual activity: Yes  Other Topics Concern  . None  Social History Narrative  . None   No Known Allergies Family History  Problem Relation Age of Onset  . Depression Mother   . Colon cancer Paternal Grandfather   . Prostate cancer Unknown   . Hypertension Daughter      Past medical history, social, surgical and family history all reviewed in electronic medical record.  No pertanent information unless stated regarding to the chief complaint.   Review of Systems:Review of systems updated and as accurate as of 05/16/17  No headache, visual changes, nausea, vomiting, diarrhea, constipation, dizziness, abdominal pain, skin rash, fevers, chills, night sweats, weight loss, swollen lymph nodes, body aches, joint swelling, muscle aches, chest pain, shortness of breath, mood changes.  Positive muscle aches  Objective  Blood pressure 124/80, pulse 74, height 6' (1.829 m), weight 198 lb (89.8 kg), SpO2 98 %. Systems examined below as of 05/16/17   General: No apparent distress alert and oriented x3 mood and affect normal, dressed appropriately.  HEENT: Pupils equal, extraocular movements intact  Respiratory: Patient's speak in full sentences and does not appear short of breath  Cardiovascular: No lower extremity edema, non tender, no erythema  Skin: Warm dry intact with no signs of infection or rash on extremities or on axial skeleton.  Abdomen: Soft nontender  Neuro: Cranial nerves II through XII are intact, neurovascularly intact in all extremities with 2+ DTRs and 2+ pulses.  Lymph: No lymphadenopathy of posterior or anterior cervical chain or axillae bilaterally.  Gait normal with good balance and coordination.  MSK:  Non tender with full range of motion and good stability and symmetric strength and tone of shoulders, elbows, wrist, hip, knee and ankles bilaterally.  Neck: Inspection unremarkable. No palpable stepoffs. Negative Spurling's maneuver. Mild limitation of 5 degrees with rotation and side bending to the right Grip strength and sensation normal in bilateral hands Strength good C4 to T1  distribution No sensory change to C4 to T1 Negative Hoffman sign bilaterally Reflexes normal  Back Exam:  Inspection: Mild loss of lordosis.  Still working on core strength Motion: Flexion 45 deg, Extension 45 deg, Side Bending to 45 deg bilaterally,  Rotation to 45 deg bilaterally  SLR laying: Negative  XSLR laying: Negative  Palpable tenderness: Tender to palpation in the paraspinal musculature.Marland Kitchen FABER:  negative. Sensory change: Gross sensation intact to all lumbar and sacral dermatomes.  Reflexes: 2+ at both patellar tendons, 2+ at achilles tendons, Babinski's downgoing.  Strength at foot  Plantar-flexion: 5/5 Dorsi-flexion: 5/5 Eversion: 5/5 Inversion: 5/5  Leg strength  Quad: 5/5 Hamstring: 5/5 Hip flexor: 5/5 Hip abductors: 5/5  Gait unremarkable.  Osteopathic findings C2 flexed rotated and side bent right C4 flexed rotated and side bent left C6 flexed rotated and side bent left T3 extended rotated and side bent right inhaled third rib T6 extended rotated and side bent left L3 flexed rotated and side bent right Sacrum right on right     Impression and Recommendations:     This case required medical decision making of moderate complexity.      Note: This dictation was prepared with Dragon dictation along with smaller phrase technology. Any transcriptional errors that result from this process are unintentional.

## 2017-05-16 ENCOUNTER — Encounter: Payer: Self-pay | Admitting: Family Medicine

## 2017-05-16 ENCOUNTER — Ambulatory Visit (INDEPENDENT_AMBULATORY_CARE_PROVIDER_SITE_OTHER): Payer: PRIVATE HEALTH INSURANCE | Admitting: Family Medicine

## 2017-05-16 VITALS — BP 124/80 | HR 74 | Ht 72.0 in | Wt 198.0 lb

## 2017-05-16 DIAGNOSIS — M999 Biomechanical lesion, unspecified: Secondary | ICD-10-CM | POA: Diagnosis not present

## 2017-05-16 DIAGNOSIS — M940 Chondrocostal junction syndrome [Tietze]: Secondary | ICD-10-CM | POA: Diagnosis not present

## 2017-05-16 DIAGNOSIS — M5416 Radiculopathy, lumbar region: Secondary | ICD-10-CM | POA: Diagnosis not present

## 2017-05-16 NOTE — Assessment & Plan Note (Signed)
Decision today to treat with OMT was based on Physical Exam  After verbal consent patient was treated with HVLA, ME, FPR techniques in cervical, thoracic, lumbar and sacral areas  Patient tolerated the procedure well with improvement in symptoms  Patient given exercises, stretches and lifestyle modifications  See medications in patient instructions if given  Patient will follow up in 4-6 weeks 

## 2017-05-16 NOTE — Assessment & Plan Note (Signed)
Stable.  No radicular symptoms at this time.

## 2017-05-16 NOTE — Patient Instructions (Signed)
4 weeks

## 2017-05-16 NOTE — Assessment & Plan Note (Signed)
Overall doing relatively well.  Continues to have some tightness of the mid back as well.  Responded well to osteopathic manipulation.  No longer having the radicular symptoms but still has a catching sensation from time to time.  Patient will be leaving for many more months but will see me again just in case in 4 weeks.  Follow-up at that time

## 2017-06-04 ENCOUNTER — Encounter: Payer: Self-pay | Admitting: *Deleted

## 2017-06-04 NOTE — Telephone Encounter (Signed)
This encounter was created in error - please disregard.

## 2017-06-05 ENCOUNTER — Encounter: Payer: Self-pay | Admitting: Internal Medicine

## 2017-06-05 ENCOUNTER — Ambulatory Visit (INDEPENDENT_AMBULATORY_CARE_PROVIDER_SITE_OTHER): Payer: PRIVATE HEALTH INSURANCE | Admitting: Internal Medicine

## 2017-06-05 DIAGNOSIS — R42 Dizziness and giddiness: Secondary | ICD-10-CM | POA: Diagnosis not present

## 2017-06-05 MED ORDER — MECLIZINE HCL 12.5 MG PO TABS
12.5000 mg | ORAL_TABLET | Freq: Three times a day (TID) | ORAL | 1 refills | Status: DC | PRN
Start: 2017-06-05 — End: 2018-03-13

## 2017-06-05 MED ORDER — METHYLPREDNISOLONE 4 MG PO TBPK
ORAL_TABLET | ORAL | 1 refills | Status: DC
Start: 2017-06-05 — End: 2018-01-08

## 2017-06-05 NOTE — Assessment & Plan Note (Signed)
BPV 3/19 Benign Positional Vertigo symptoms on the ?left. Start Meclizine. Start Laruth Bouchard - Daroff exercise several times a day as dirrected if relapsed Dosepac if relapsed ENT if needed.

## 2017-06-05 NOTE — Patient Instructions (Signed)
Benign Positional Vertigo symptoms on the ?left. Start Meclizine if relapsed. Start Laruth Bouchard - Daroff exercise several times a day as dirrected if relapsed Dosepakc steroid if relapsed ENT if needed.

## 2017-06-05 NOTE — Progress Notes (Signed)
Subjective:  Patient ID: Aaron Sharp, male    DOB: [birth year]  Age: 52 y.o. MRN: [id number]  CC: No chief complaint on file.   HPI Aaron Sharp presents for dizziness x 1 week. It was bad on Sat. No LOC. No HA  Outpatient Medications Prior to Visit  Medication Sig Dispense Refill  . predniSONE (DELTASONE) 50 MG tablet Take 1 tablet (50 mg total) by mouth daily. 5 tablet 0   Facility-Administered Medications Prior to Visit  Medication Dose Route Frequency Provider Last Rate Last Dose  . methylPREDNISolone acetate (DEPO-MEDROL) injection 40 mg  40 mg Intramuscular Once [name], [name], MD        ROS Review of Systems  Constitutional: Negative for appetite change, fatigue and unexpected weight change.  HENT: Positive for congestion. Negative for nosebleeds, sinus pain, sneezing, sore throat and trouble swallowing.   Eyes: Negative for itching and visual disturbance.  Respiratory: Negative for cough.   Cardiovascular: Negative for chest pain, palpitations and leg swelling.  Gastrointestinal: Negative for abdominal distention, blood in stool, diarrhea and nausea.  Genitourinary: Negative for frequency and hematuria.  Musculoskeletal: Negative for back pain, gait problem, joint swelling and neck pain.  Skin: Negative for rash.  Neurological: Positive for dizziness. Negative for tremors, speech difficulty and weakness.  Psychiatric/Behavioral: Negative for agitation, dysphoric mood and sleep disturbance. The patient is not nervous/anxious.     Objective:  BP 122/84 (BP Location: Left Arm, Patient Position: Standing, Cuff Size: Large)   Pulse 61   Temp 98.2 F (36.8 C) (Oral)   Ht 6' (1.829 m)   Wt 197 lb (89.4 kg)   SpO2 99%   BMI 26.72 kg/m   BP Readings from Last 3 Encounters:  06/05/17 122/84  05/16/17 124/80  04/23/17 124/82    Wt Readings from Last 3 Encounters:  06/05/17 197 lb (89.4 kg)  05/16/17 198 lb (89.8 kg)  04/23/17 199 lb (90.3 kg)     Physical Exam  Constitutional: He is oriented to person, place, and time. He appears well-developed. No distress.  NAD  HENT:  Mouth/Throat: Oropharynx is clear and moist.  Eyes: Conjunctivae are normal. Pupils are equal, round, and reactive to light.  Neck: Normal range of motion. No JVD present. No thyromegaly present.  Cardiovascular: Normal rate, regular rhythm, normal heart sounds and intact distal pulses. Exam reveals no gallop and no friction rub.  No murmur heard. Pulmonary/Chest: Effort normal and breath sounds normal. No respiratory distress. He has no wheezes. He has no rales. He exhibits no tenderness.  Abdominal: Soft. Bowel sounds are normal. He exhibits no distension and no mass. There is no tenderness. There is no rebound and no guarding.  Musculoskeletal: Normal range of motion. He exhibits no edema or tenderness.  Lymphadenopathy:    He has no cervical adenopathy.  Neurological: He is alert and oriented to person, place, and time. He has normal reflexes. No cranial nerve deficit. He exhibits normal muscle tone. He displays a negative Romberg sign. Coordination and gait normal.  Skin: Skin is warm and dry. No rash noted.  Psychiatric: He has a normal mood and affect. His behavior is normal. Judgment and thought content normal.    Lab Results  Component Value Date   WBC 5.3 08/03/2016   HGB 14.7 08/03/2016   HCT 43.1 08/03/2016   PLT 171.0 08/03/2016   GLUCOSE 89 08/03/2016   CHOL 221 (H) 08/03/2016   TRIG 230.0 (H) 08/03/2016   HDL 44.20 08/03/2016  LDLDIRECT 125.0 08/03/2016   LDLCALC 141 (H) 08/23/2015   ALT 36 08/03/2016   AST 29 08/03/2016   NA 137 08/03/2016   K 4.5 08/03/2016   CL 103 08/03/2016   CREATININE 1.12 08/03/2016   BUN 14 08/03/2016   CO2 27 08/03/2016   TSH 5.90 (H) 09/11/2016   PSA 2.13 08/03/2016   INR 1.09 11/17/2009    Dg Lumbar Spine 2-3 Views  Result Date: 04/15/2017 CLINICAL DATA:  Lifting injury 3 weeks ago with  persistent low back and left leg pain and numbness and tingling. EXAM: LUMBAR SPINE - 2-3 VIEW COMPARISON:  None in PACs FINDINGS: The lumbar vertebral bodies are preserved in height. The transverse processes of L1 are transitional. The pedicles and transverse processes are intact. The disc space heights are reasonably well-maintained. There is no spondylolisthesis. There is no significant facet joint hypertrophy. IMPRESSION: There is no acute or significant chronic bony abnormality of the lumbar spine. Electronically Signed   By: David  Martinique M.D.   On: 04/15/2017 13:40    Assessment & Plan:   There are no diagnoses linked to this encounter. I have discontinued Taysean Gudger's predniSONE. We will continue to administer methylPREDNISolone acetate.  No orders of the defined types were placed in this encounter.    Follow-up: No Follow-up on file.  Walker Kehr, MD

## 2017-06-07 ENCOUNTER — Encounter: Payer: Self-pay | Admitting: Internal Medicine

## 2017-06-07 NOTE — Progress Notes (Deleted)
[name] [place], [place] [address] Phone: [phone] Subjective:    I'm seeing this patient by the request  of:    CC: Back pain follow-up  OBS:[id number]  Aaron Sharp is a 52 y.o. male coming in with complaint of pain.  Patient has been seen multiple times.  Has responded fairly well to osteopathic manipulation.  Did have one exacerbation in January.  Patient states      Past Medical History:  Diagnosis Date  . GIST (gastrointestinal stromal tumor), non-malignant   . Hiatal hernia    Past Surgical History:  Procedure Laterality Date  . SHOULDER SURGERY     Social History   Socioeconomic History  . Marital status: Married    Spouse name: Not on file  . Number of children: 2  . Years of education: Not on file  . Highest education level: Not on file  Social Needs  . Financial resource strain: Not on file  . Food insecurity - worry: Not on file  . Food insecurity - inability: Not on file  . Transportation needs - medical: Not on file  . Transportation needs - non-medical: Not on file  Occupational History  . Occupation: Data processing manager: [employer]  Tobacco Use  . Smoking status: Never Smoker  . Smokeless tobacco: Never Used  Substance and Sexual Activity  . Alcohol use: Yes  . Drug use: No  . Sexual activity: Yes  Other Topics Concern  . Not on file  Social History Narrative  . Not on file   No Known Allergies Family History  Problem Relation Age of Onset  . Depression Mother   . Colon cancer Paternal Grandfather   . Prostate cancer Unknown   . Hypertension Daughter      Past medical history, social, surgical and family history all reviewed in electronic medical record.  No pertanent information unless stated regarding to the chief complaint.   Review of Systems:Review of systems updated and as accurate as of 06/07/17  No headache, visual changes, nausea, vomiting, diarrhea,  constipation, dizziness, abdominal pain, skin rash, fevers, chills, night sweats, weight loss, swollen lymph nodes, body aches, joint swelling, muscle aches, chest pain, shortness of breath, mood changes.   Objective  There were no vitals taken for this visit. Systems examined below as of 06/07/17   General: No apparent distress alert and oriented x3 mood and affect normal, dressed appropriately.  HEENT: Pupils equal, extraocular movements intact  Respiratory: Patient's speak in full sentences and does not appear short of breath  Cardiovascular: No lower extremity edema, non tender, no erythema  Skin: Warm dry intact with no signs of infection or rash on extremities or on axial skeleton.  Abdomen: Soft nontender  Neuro: Cranial nerves II through XII are intact, neurovascularly intact in all extremities with 2+ DTRs and 2+ pulses.  Lymph: No lymphadenopathy of posterior or anterior cervical chain or axillae bilaterally.  Gait normal with good balance and coordination.  MSK:  Non tender with full range of motion and good stability and symmetric strength and tone of shoulders, elbows, wrist, hip, knee and ankles bilaterally.     Impression and Recommendations:     This case required medical decision making of moderate complexity.      Note: This dictation was prepared with Dragon dictation along with smaller phrase technology. Any transcriptional errors that result from this process are unintentional.

## 2017-06-10 ENCOUNTER — Ambulatory Visit: Payer: PRIVATE HEALTH INSURANCE | Admitting: Family Medicine

## 2017-06-10 DIAGNOSIS — Z0289 Encounter for other administrative examinations: Secondary | ICD-10-CM

## 2017-08-06 ENCOUNTER — Encounter: Payer: PRIVATE HEALTH INSURANCE | Admitting: Internal Medicine

## 2017-09-10 ENCOUNTER — Encounter: Payer: PRIVATE HEALTH INSURANCE | Admitting: Internal Medicine

## 2017-09-10 DIAGNOSIS — Z0289 Encounter for other administrative examinations: Secondary | ICD-10-CM

## 2017-09-29 IMAGING — DX DG CERVICAL SPINE COMPLETE 4+V
5 series · 5 of 5 positions shown · non-contrast
Comparison: CT of the neck of 07/09/2012

CLINICAL DATA: Neck and right shoulder pain over the last 8 years,
no injury

EXAM:
CERVICAL SPINE - COMPLETE 4+ VIEW

[c-spine lat]
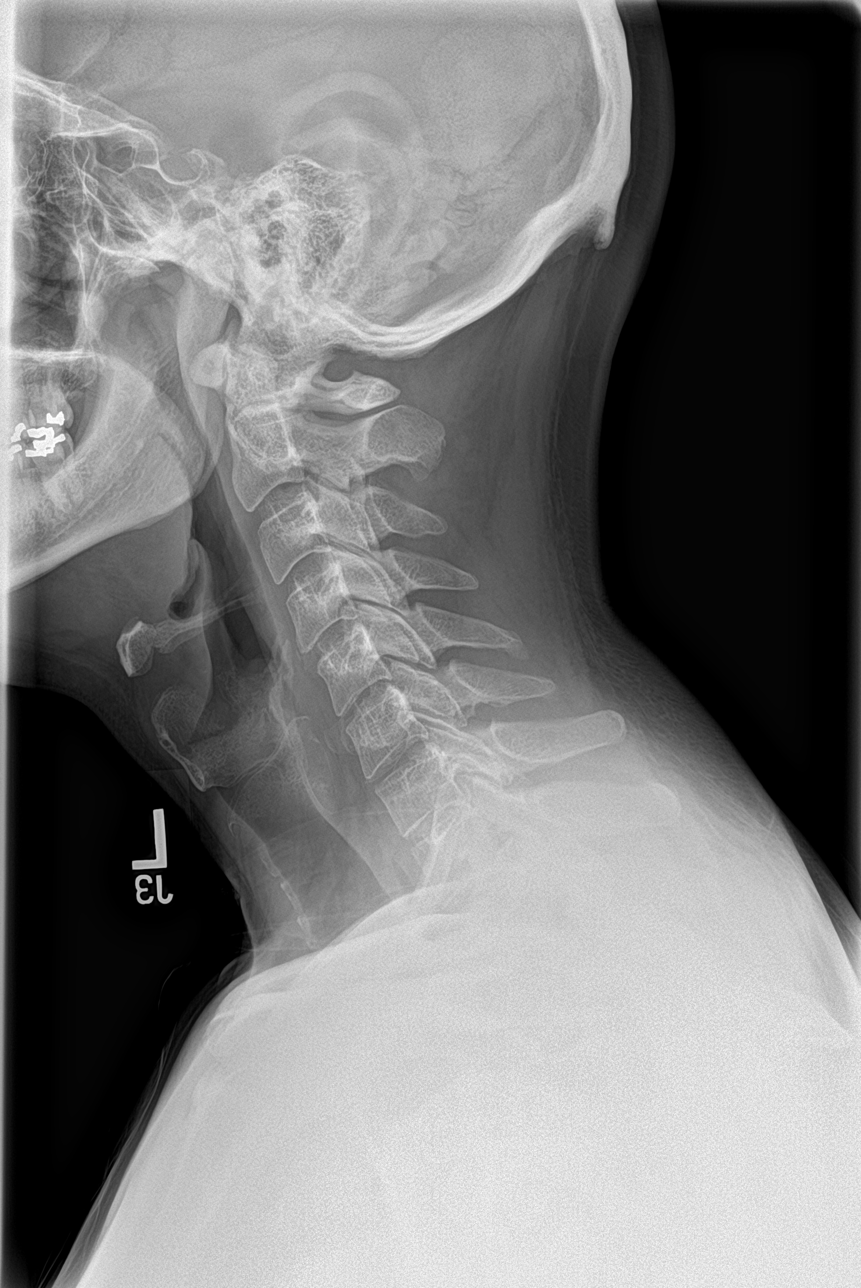

[c-spine obl (1 of 2)]
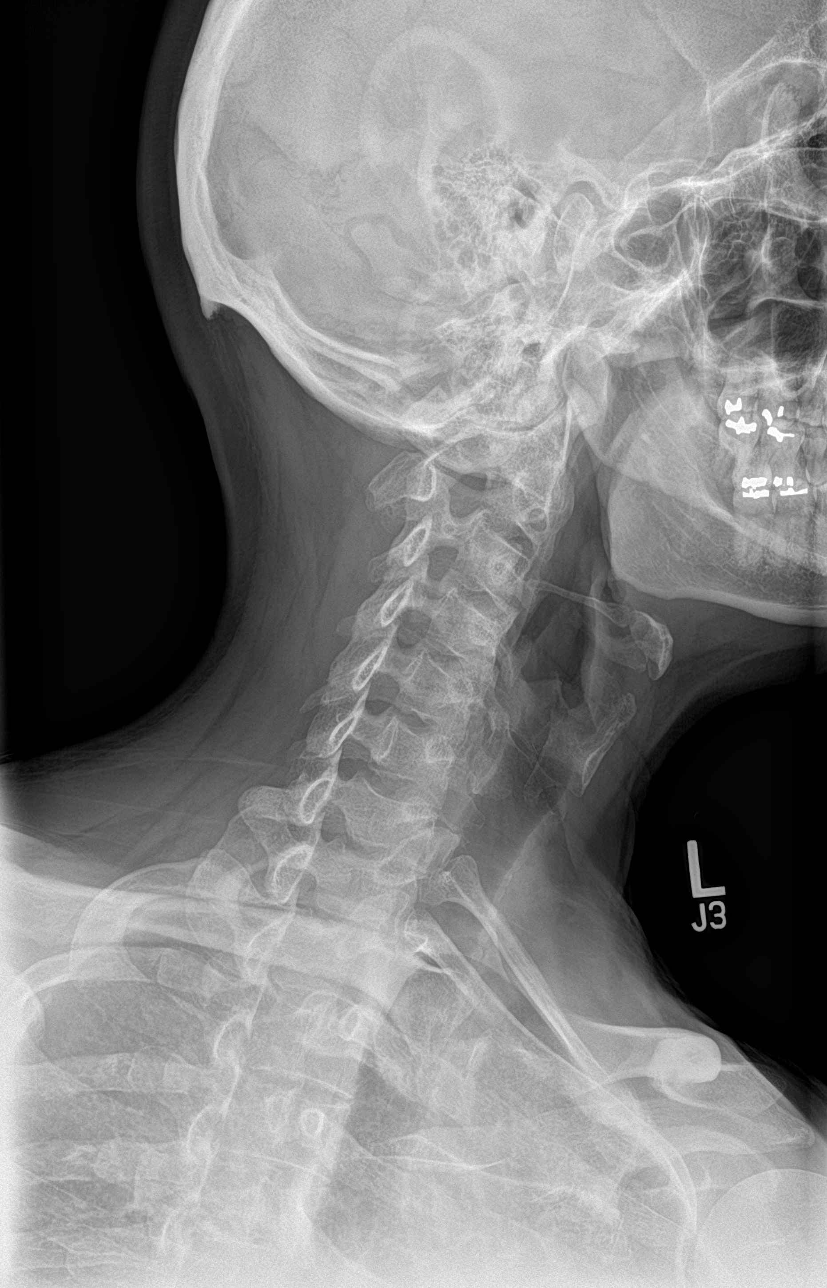

[c-spine obl (2 of 2)]
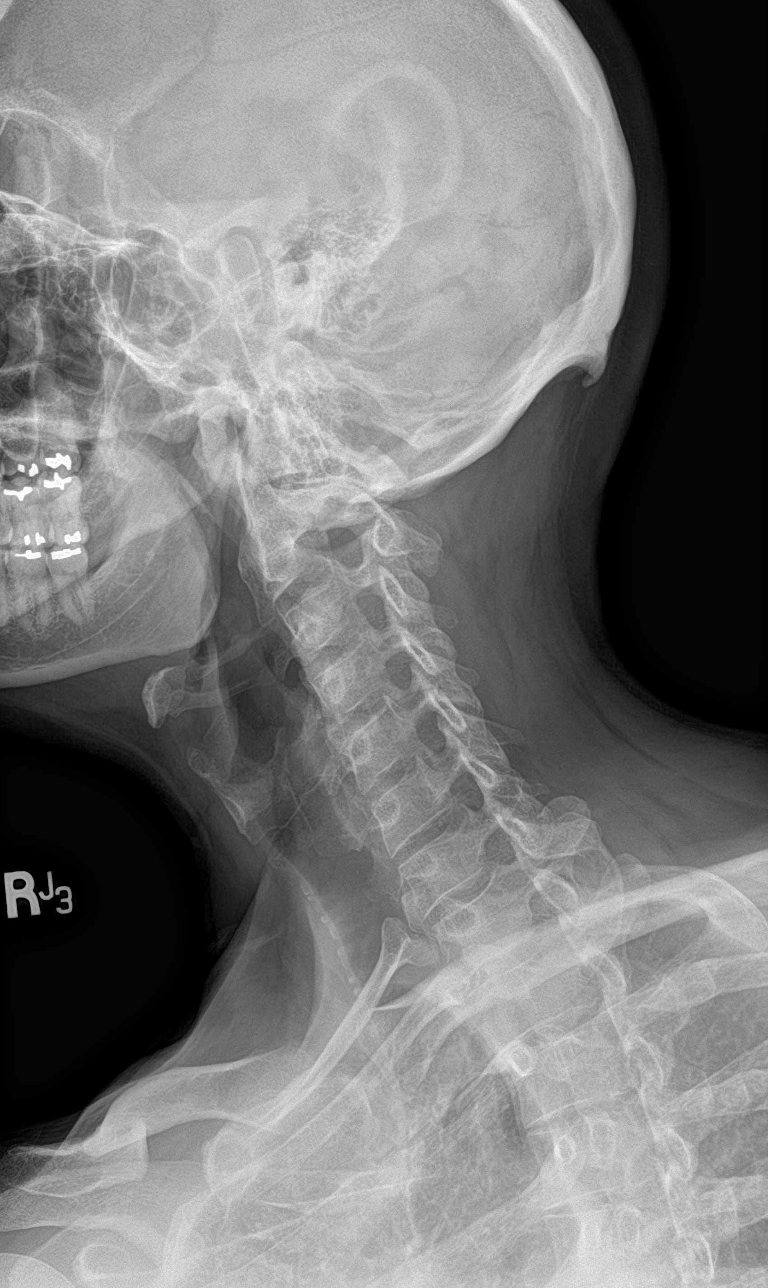

[c-spine ap]
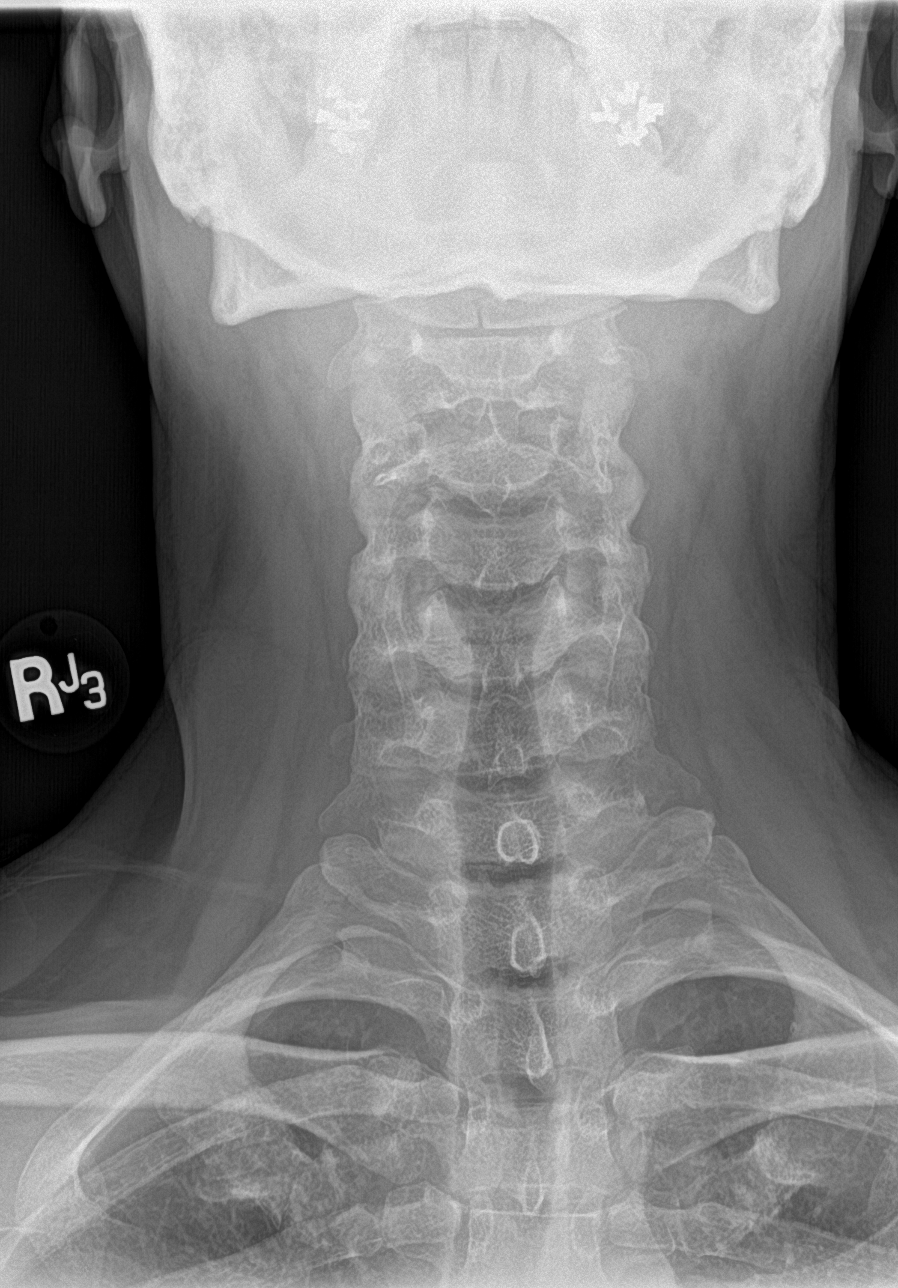

[c-spine open mouth]
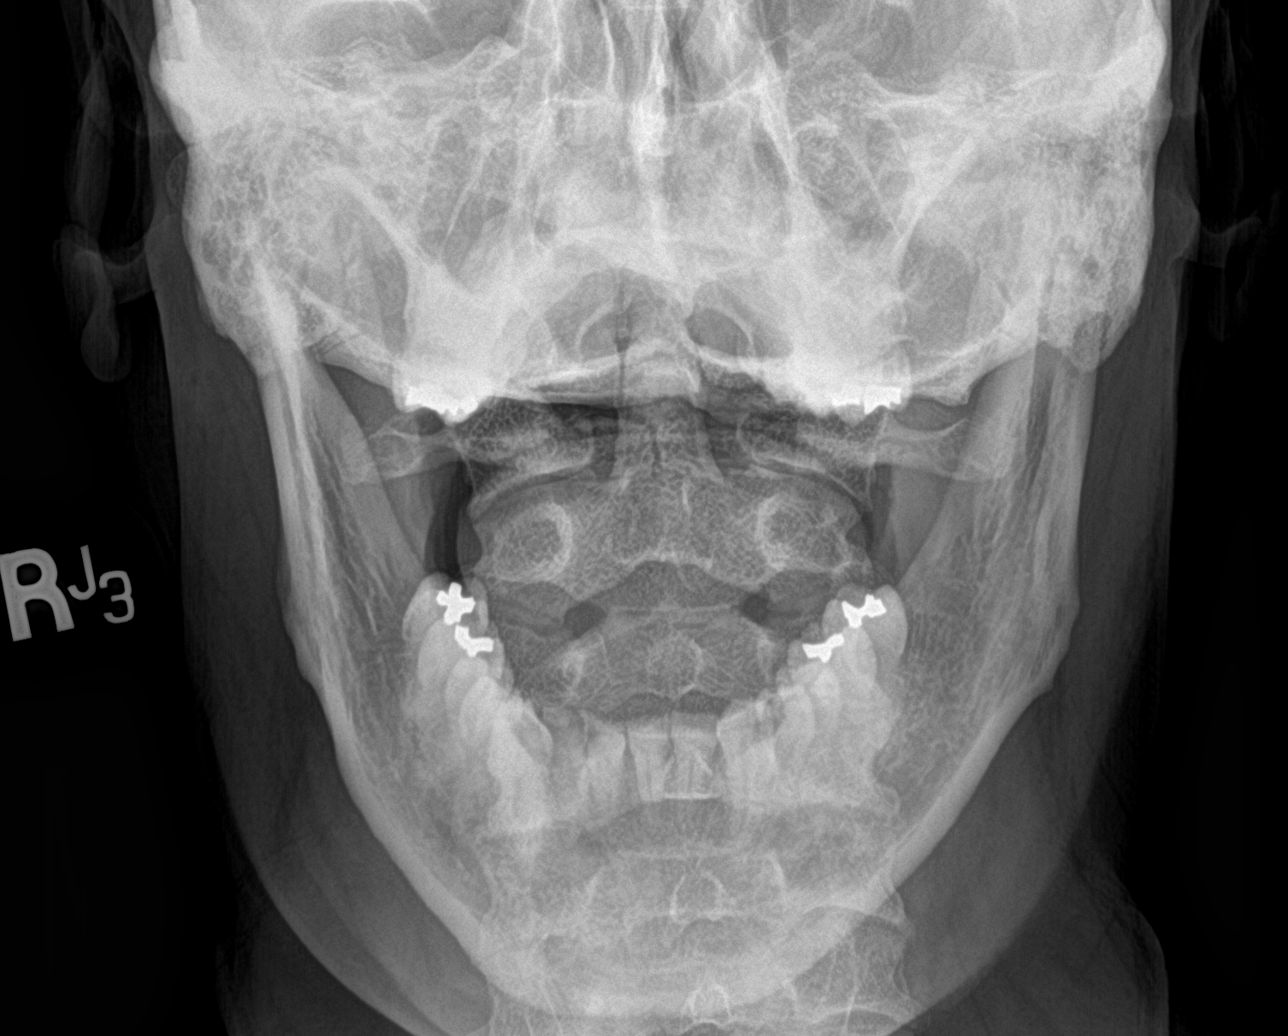

[5 of 5 positions shown; findings below may reference images not displayed]

FINDINGS: The cervical vertebrae remain slightly straightened in alignment.
Intervertebral disc spaces appear normal. No prevertebral soft
tissue swelling is seen. On oblique views, the foramina are patent.
The odontoid process is intact. The lung apices are clear.
IMPRESSION: Straightened alignment. Normal intervertebral disc spaces. No
foraminal narrowing.

## 2018-01-08 ENCOUNTER — Encounter: Payer: Self-pay | Admitting: Internal Medicine

## 2018-01-08 ENCOUNTER — Ambulatory Visit (INDEPENDENT_AMBULATORY_CARE_PROVIDER_SITE_OTHER): Payer: PRIVATE HEALTH INSURANCE | Admitting: Internal Medicine

## 2018-01-08 VITALS — BP 118/80 | HR 60 | Temp 98.5°F | Ht 72.0 in | Wt 204.0 lb

## 2018-01-08 DIAGNOSIS — Z0000 Encounter for general adult medical examination without abnormal findings: Secondary | ICD-10-CM

## 2018-01-08 DIAGNOSIS — Z23 Encounter for immunization: Secondary | ICD-10-CM | POA: Diagnosis not present

## 2018-01-08 DIAGNOSIS — K319 Disease of stomach and duodenum, unspecified: Secondary | ICD-10-CM

## 2018-01-08 DIAGNOSIS — R5383 Other fatigue: Secondary | ICD-10-CM

## 2018-01-08 DIAGNOSIS — R42 Dizziness and giddiness: Secondary | ICD-10-CM | POA: Diagnosis not present

## 2018-01-08 DIAGNOSIS — K3189 Other diseases of stomach and duodenum: Secondary | ICD-10-CM | POA: Insufficient documentation

## 2018-01-08 NOTE — Assessment & Plan Note (Signed)
Labs ?etiology

## 2018-01-08 NOTE — Progress Notes (Signed)
Subjective:  Patient ID: Aaron Sharp, male    DOB: [birth year]  Age: 52 y.o. MRN: [id number]  CC: No chief complaint on file.   HPI Aaron Sharp presents for a well exam C/o vertigo x 12 mo C/o ?OSA  Outpatient Medications Prior to Visit  Medication Sig Dispense Refill  . meclizine (ANTIVERT) 12.5 MG tablet Take 1 tablet (12.5 mg total) by mouth 3 (three) times daily as needed for dizziness. 60 tablet 1  . methylPREDNISolone (MEDROL DOSEPAK) 4 MG TBPK tablet As directed 21 tablet 1   No facility-administered medications prior to visit.     ROS: Review of Systems  Constitutional: Positive for fatigue. Negative for appetite change and unexpected weight change.  HENT: Negative for congestion, nosebleeds, sneezing, sore throat and trouble swallowing.   Eyes: Negative for itching and visual disturbance.  Respiratory: Negative for cough.   Cardiovascular: Negative for chest pain, palpitations and leg swelling.  Gastrointestinal: Negative for abdominal distention, blood in stool, diarrhea and nausea.  Genitourinary: Negative for frequency and hematuria.  Musculoskeletal: Positive for arthralgias and back pain. Negative for gait problem, joint swelling and neck pain.  Skin: Negative for rash.  Neurological: Positive for dizziness. Negative for tremors, speech difficulty and weakness.  Psychiatric/Behavioral: Negative for agitation, dysphoric mood and sleep disturbance. The patient is not nervous/anxious.     Objective:  BP 118/80 (BP Location: Left Arm, Patient Position: Sitting, Cuff Size: Normal)   Pulse 60   Temp 98.5 F (36.9 C) (Oral)   Ht 6' (1.829 m)   Wt 204 lb (92.5 kg)   SpO2 97%   BMI 27.67 kg/m   BP Readings from Last 3 Encounters:  01/08/18 118/80  06/05/17 122/84  05/16/17 124/80    Wt Readings from Last 3 Encounters:  01/08/18 204 lb (92.5 kg)  06/05/17 197 lb (89.4 kg)  05/16/17 198 lb (89.8 kg)    Physical Exam  Constitutional: He is oriented  to person, place, and time. He appears well-developed. No distress.  NAD  HENT:  Mouth/Throat: Oropharynx is clear and moist.  Eyes: Pupils are equal, round, and reactive to light. Conjunctivae are normal.  Neck: Normal range of motion. No JVD present. No thyromegaly present.  Cardiovascular: Normal rate, regular rhythm, normal heart sounds and intact distal pulses. Exam reveals no gallop and no friction rub.  No murmur heard. Pulmonary/Chest: Effort normal and breath sounds normal. No respiratory distress. He has no wheezes. He has no rales. He exhibits no tenderness.  Abdominal: Soft. Bowel sounds are normal. He exhibits no distension and no mass. There is no tenderness. There is no rebound and no guarding.  Musculoskeletal: Normal range of motion. He exhibits no edema or tenderness.  Lymphadenopathy:    He has no cervical adenopathy.  Neurological: He is alert and oriented to person, place, and time. He has normal reflexes. No cranial nerve deficit. He exhibits normal muscle tone. He displays a negative Romberg sign. Coordination and gait normal.  Skin: Skin is warm and dry. No rash noted.  Psychiatric: He has a normal mood and affect. His behavior is normal. Judgment and thought content normal.  rectal - per GI pending  Lab Results  Component Value Date   WBC 5.3 08/03/2016   HGB 14.7 08/03/2016   HCT 43.1 08/03/2016   PLT 171.0 08/03/2016   GLUCOSE 89 08/03/2016   CHOL 221 (H) 08/03/2016   TRIG 230.0 (H) 08/03/2016   HDL 44.20 08/03/2016   LDLDIRECT 125.0 08/03/2016  LDLCALC 141 (H) 08/23/2015   ALT 36 08/03/2016   AST 29 08/03/2016   NA 137 08/03/2016   K 4.5 08/03/2016   CL 103 08/03/2016   CREATININE 1.12 08/03/2016   BUN 14 08/03/2016   CO2 27 08/03/2016   TSH 5.90 (H) 09/11/2016   PSA 2.13 08/03/2016   INR 1.09 11/17/2009    Dg Lumbar Spine 2-3 Views  Result Date: 04/15/2017 CLINICAL DATA:  Lifting injury 3 weeks ago with persistent low back and left leg pain  and numbness and tingling. EXAM: LUMBAR SPINE - 2-3 VIEW COMPARISON:  None in PACs FINDINGS: The lumbar vertebral bodies are preserved in height. The transverse processes of L1 are transitional. The pedicles and transverse processes are intact. The disc space heights are reasonably well-maintained. There is no spondylolisthesis. There is no significant facet joint hypertrophy. IMPRESSION: There is no acute or significant chronic bony abnormality of the lumbar spine. Electronically Signed   By: David  Martinique M.D.   On: 04/15/2017 13:40    Assessment & Plan:   There are no diagnoses linked to this encounter.   No orders of the defined types were placed in this encounter.    Follow-up: No follow-ups on file.  Walker Kehr, MD

## 2018-01-08 NOTE — Addendum Note (Signed)
Addended by: Karren Cobble on: 01/08/2018 03:44 PM   Modules accepted: Orders

## 2018-01-08 NOTE — Assessment & Plan Note (Signed)
Refractory ?ENT ref ?

## 2018-01-08 NOTE — Assessment & Plan Note (Signed)
GI appt w/Dr Ardis Hughs

## 2018-01-08 NOTE — Assessment & Plan Note (Signed)
We discussed age appropriate health related issues, including available/recomended screening tests and vaccinations. We discussed a need for adhering to healthy diet and exercise. Labs ordered. All questions were answered. Yearly skin exams by [facility]

## 2018-01-09 ENCOUNTER — Other Ambulatory Visit (INDEPENDENT_AMBULATORY_CARE_PROVIDER_SITE_OTHER): Payer: PRIVATE HEALTH INSURANCE

## 2018-01-09 ENCOUNTER — Encounter: Payer: Self-pay | Admitting: Gastroenterology

## 2018-01-09 DIAGNOSIS — Z125 Encounter for screening for malignant neoplasm of prostate: Secondary | ICD-10-CM | POA: Diagnosis not present

## 2018-01-09 DIAGNOSIS — R5383 Other fatigue: Secondary | ICD-10-CM

## 2018-01-09 DIAGNOSIS — Z0000 Encounter for general adult medical examination without abnormal findings: Secondary | ICD-10-CM

## 2018-01-09 LAB — BASIC METABOLIC PANEL
BUN: 12 mg/dL (ref 6–23)
CO2: 27 mEq/L (ref 19–32)
CREATININE: 1.11 mg/dL (ref 0.40–1.50)
Calcium: 9.3 mg/dL (ref 8.4–10.5)
Chloride: 107 mEq/L (ref 96–112)
GFR: 74.5 mL/min (ref >60.00)
GLUCOSE: 90 mg/dL (ref 70–99)
POTASSIUM: 4.2 meq/L (ref 3.5–5.1)
Sodium: 141 mEq/L (ref 135–145)

## 2018-01-09 LAB — PSA: PSA: 2.33 ng/mL (ref 0.10–4.00)

## 2018-01-09 LAB — LIPID PANEL
CHOL/HDL RATIO: 5
Cholesterol: 179 mg/dL (ref 0–200)
HDL: 36.3 mg/dL — AB (ref >39.00)
LDL Cholesterol: 109 mg/dL — ABNORMAL HIGH (ref 0–99)
NONHDL: 142.85
TRIGLYCERIDES: 168 mg/dL — AB (ref 0.0–149.0)
VLDL: 33.6 mg/dL (ref 0.0–40.0)

## 2018-01-09 LAB — CBC WITH DIFFERENTIAL/PLATELET
BASOS PCT: 0.4 % (ref 0.0–3.0)
Basophils Absolute: 0 10*3/uL (ref 0.0–0.1)
EOS PCT: 0.8 % (ref 0.0–5.0)
Eosinophils Absolute: 0 10*3/uL (ref 0.0–0.7)
HEMATOCRIT: 42.9 % (ref 39.0–52.0)
HEMOGLOBIN: 14.8 g/dL (ref 13.0–17.0)
LYMPHS PCT: 13.2 % (ref 12.0–46.0)
Lymphs Abs: 0.8 10*3/uL (ref 0.7–4.0)
MCHC: 34.5 g/dL (ref 30.0–36.0)
MCV: 92.3 fl (ref 78.0–100.0)
MONOS PCT: 9 % (ref 3.0–12.0)
Monocytes Absolute: 0.5 10*3/uL (ref 0.1–1.0)
NEUTROS PCT: 76.6 % (ref 43.0–77.0)
Neutro Abs: 4.5 10*3/uL (ref 1.4–7.7)
Platelets: 160 10*3/uL (ref 150.0–400.0)
RBC: 4.64 Mil/uL (ref 4.22–5.81)
RDW: 12.4 % (ref 11.5–15.5)
WBC: 5.9 10*3/uL (ref 4.0–10.5)

## 2018-01-09 LAB — URINALYSIS, ROUTINE W REFLEX MICROSCOPIC
Bilirubin Urine: NEGATIVE
Hgb urine dipstick: NEGATIVE
Ketones, ur: NEGATIVE
Nitrite: NEGATIVE
PH: 5.5 (ref 5.0–8.0)
RBC / HPF: NONE SEEN (ref 0–?)
SPECIFIC GRAVITY, URINE: 1.025 (ref 1.000–1.030)
Total Protein, Urine: NEGATIVE
Urine Glucose: NEGATIVE
Urobilinogen, UA: 0.2 (ref 0.0–1.0)

## 2018-01-09 LAB — HEPATIC FUNCTION PANEL
ALBUMIN: 4.4 g/dL (ref 3.5–5.2)
ALT: 17 U/L (ref 0–53)
AST: 16 U/L (ref 0–37)
Alkaline Phosphatase: 59 U/L (ref 39–117)
BILIRUBIN TOTAL: 0.7 mg/dL (ref 0.2–1.2)
Bilirubin, Direct: 0.1 mg/dL (ref 0.0–0.3)
Total Protein: 7.1 g/dL (ref 6.0–8.3)

## 2018-01-09 LAB — T4, FREE: FREE T4: 0.68 ng/dL (ref 0.60–1.60)

## 2018-01-09 LAB — TSH: TSH: 3.71 u[IU]/mL (ref 0.35–4.50)

## 2018-02-07 ENCOUNTER — Other Ambulatory Visit (HOSPITAL_BASED_OUTPATIENT_CLINIC_OR_DEPARTMENT_OTHER): Payer: Self-pay

## 2018-02-07 DIAGNOSIS — G4730 Sleep apnea, unspecified: Secondary | ICD-10-CM

## 2018-02-07 DIAGNOSIS — R5383 Other fatigue: Secondary | ICD-10-CM

## 2018-02-11 ENCOUNTER — Ambulatory Visit (INDEPENDENT_AMBULATORY_CARE_PROVIDER_SITE_OTHER): Payer: PRIVATE HEALTH INSURANCE | Admitting: Gastroenterology

## 2018-02-11 ENCOUNTER — Encounter: Payer: Self-pay | Admitting: Gastroenterology

## 2018-02-11 VITALS — BP 116/72 | HR 82 | Ht 72.0 in | Wt 206.0 lb

## 2018-02-11 DIAGNOSIS — Z1211 Encounter for screening for malignant neoplasm of colon: Secondary | ICD-10-CM

## 2018-02-11 DIAGNOSIS — K3189 Other diseases of stomach and duodenum: Secondary | ICD-10-CM

## 2018-02-11 MED ORDER — PEG 3350-KCL-NA BICARB-NACL 420 G PO SOLR: 4000.0000 mL | ORAL | 0 refills | Status: DC

## 2018-02-11 NOTE — H&P (View-Only) (Signed)
Review of pertinent gastrointestinal problems: 1.  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 Fuller Plan.  Endoscopic ultrasound in 2006 also showed this, measured about 2-1/2 cm, FNA showed spindle cells but no special staining done. Repeat EUS 2012 showed the lesion had not changed in size (over 6 years).  I recommended repeat EUS in 3 years.     HPI: This is a very pleasant 52 year old man who was referred to me by [name], [name], MD  to evaluate gastric submucosal lesion, colon cancer screening.    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  At that time I had recommended repeat endoscopic ultrasound at about a 3-year interval.  Since then he has had no issues with his stomach.  No dysphasia, no significant abdominal pains, no nausea or vomiting.   He has no trouble with his bowels.  No serious constipation diarrhea or overt bleeding.     Review of systems: Pertinent positive and negative review of systems were noted in the above HPI section. All other review negative.   Past Medical History:  Diagnosis Date  . GIST (gastrointestinal stromal tumor), non-malignant   . Hiatal hernia     Past Surgical History:  Procedure Laterality Date  . SHOULDER SURGERY      Current Outpatient Medications  Medication Sig Dispense Refill  . meclizine (ANTIVERT) 12.5 MG tablet Take 1 tablet (12.5 mg total) by mouth 3 (three) times daily as needed for dizziness. 60 tablet 1   No current facility-administered medications for this visit.     Allergies as of 02/11/2018  . (No Known Allergies)    Family History  Problem Relation Age of Onset  . Depression Mother   . Colon cancer Paternal Grandfather   . Prostate cancer Unknown   . Hypertension Daughter     Social History    Socioeconomic History  . Marital status: Married    Spouse name: Not on file  . Number of children: 2  . Years of education: Not on file  . Highest education level: Not on file  Occupational History  . Occupation: Data processing manager: [employer]  . Financial resource strain: Not on file  . Food insecurity:    Worry: Not on file    Inability: Not on file  . Transportation needs:    Medical: Not on file    Non-medical: Not on file  Tobacco Use  . Smoking status: Never Smoker  . Smokeless tobacco: Never Used  Substance and Sexual Activity  . Alcohol use: Yes  . Drug use: No  . Sexual activity: Yes  Lifestyle  . Physical activity:    Days per week: Not on file    Minutes per session: Not on file  . Stress: Not on file  Relationships  . Social connections:    Talks on phone: Not on file    Gets together: Not on file    Attends religious service: Not on file    Active member of club or organization: Not on file    Attends meetings of clubs or organizations: Not on file    Relationship status: Not on file  . Intimate partner violence:    Fear of current or ex partner: Not on file    Emotionally abused: Not on  file    Physically abused: Not on file    Forced sexual activity: Not on file  Other Topics Concern  . Not on file  Social History Narrative  . Not on file     Physical Exam: BP 116/72   Pulse 82   Ht 6' (1.829 m)   Wt 206 lb (93.4 kg)   BMI 27.94 kg/m  Constitutional: generally well-appearing Psychiatric: alert and oriented x3 Eyes: extraocular movements intact Mouth: oral pharynx moist, no lesions Neck: supple no lymphadenopathy Cardiovascular: heart regular rate and rhythm Lungs: clear to auscultation bilaterally Abdomen: soft, nontender, nondistended, no obvious ascites, no peritoneal signs, normal bowel sounds Extremities: no lower extremity edema bilaterally Skin: no lesions on visible  extremities   Assessment and plan: 52 y.o. 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  I see no reason for any further blood tests or imaging studies prior to that.    Please see the "Patient Instructions" section for addition details about the plan.   Owens Loffler, MD [facility] 02/11/2018, 3:20 PM  Cc: Cassandria Anger, MD

## 2018-02-11 NOTE — Progress Notes (Signed)
Review of pertinent gastrointestinal problems: 1.  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 Fuller Plan.  Endoscopic ultrasound in 2006 also showed this, measured about 2-1/2 cm, FNA showed spindle cells but no special staining done. Repeat EUS 2012 showed the lesion had not changed in size (over 6 years).  I recommended repeat EUS in 3 years.     HPI: This is a very pleasant 53 year old man who was referred to me by [name], [name], MD  to evaluate gastric submucosal lesion, colon cancer screening.    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  At that time I had recommended repeat endoscopic ultrasound at about a 3-year interval.  Since then he has had no issues with his stomach.  No dysphasia, no significant abdominal pains, no nausea or vomiting.   He has no trouble with his bowels.  No serious constipation diarrhea or overt bleeding.     Review of systems: Pertinent positive and negative review of systems were noted in the above HPI section. All other review negative.   Past Medical History:  Diagnosis Date  . GIST (gastrointestinal stromal tumor), non-malignant   . Hiatal hernia     Past Surgical History:  Procedure Laterality Date  . SHOULDER SURGERY      Current Outpatient Medications  Medication Sig Dispense Refill  . meclizine (ANTIVERT) 12.5 MG tablet Take 1 tablet (12.5 mg total) by mouth 3 (three) times daily as needed for dizziness. 60 tablet 1   No current facility-administered medications for this visit.     Allergies as of 02/11/2018  . (No Known Allergies)    Family History  Problem Relation Age of Onset  . Depression Mother   . Colon cancer Paternal Grandfather   . Prostate cancer Unknown   . Hypertension Daughter     Social History    Socioeconomic History  . Marital status: Married    Spouse name: Not on file  . Number of children: 2  . Years of education: Not on file  . Highest education level: Not on file  Occupational History  . Occupation: Data processing manager: [employer]  . Financial resource strain: Not on file  . Food insecurity:    Worry: Not on file    Inability: Not on file  . Transportation needs:    Medical: Not on file    Non-medical: Not on file  Tobacco Use  . Smoking status: Never Smoker  . Smokeless tobacco: Never Used  Substance and Sexual Activity  . Alcohol use: Yes  . Drug use: No  . Sexual activity: Yes  Lifestyle  . Physical activity:    Days per week: Not on file    Minutes per session: Not on file  . Stress: Not on file  Relationships  . Social connections:    Talks on phone: Not on file    Gets together: Not on file    Attends religious service: Not on file    Active member of club or organization: Not on file    Attends meetings of clubs or organizations: Not on file    Relationship status: Not on file  . Intimate partner violence:    Fear of current or ex partner: Not on file    Emotionally abused: Not on  file    Physically abused: Not on file    Forced sexual activity: Not on file  Other Topics Concern  . Not on file  Social History Narrative  . Not on file     Physical Exam: BP 116/72   Pulse 82   Ht 6' (1.829 m)   Wt 206 lb (93.4 kg)   BMI 27.94 kg/m  Constitutional: generally well-appearing Psychiatric: alert and oriented x3 Eyes: extraocular movements intact Mouth: oral pharynx moist, no lesions Neck: supple no lymphadenopathy Cardiovascular: heart regular rate and rhythm Lungs: clear to auscultation bilaterally Abdomen: soft, nontender, nondistended, no obvious ascites, no peritoneal signs, normal bowel sounds Extremities: no lower extremity edema bilaterally Skin: no lesions on visible  extremities   Assessment and plan: 52 y.o. 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  I see no reason for any further blood tests or imaging studies prior to that.    Please see the "Patient Instructions" section for addition details about the plan.   Owens Loffler, MD [facility] 02/11/2018, 3:20 PM  Cc: Cassandria Anger, MD

## 2018-02-11 NOTE — Patient Instructions (Addendum)
You will be set up for a colonoscopy for colon cancer screening. Upper EUS for subepithelial lesion in the stomach, same visit as colonoscopy.\  Thank you for entrusting me with your care and choosing [facility].  Dr Ardis Hughs

## 2018-03-13 ENCOUNTER — Ambulatory Visit (HOSPITAL_COMMUNITY): Payer: PRIVATE HEALTH INSURANCE | Admitting: Anesthesiology

## 2018-03-13 ENCOUNTER — Ambulatory Visit (HOSPITAL_COMMUNITY)
Admission: RE | Admit: 2018-03-13 | Discharge: 2018-03-13 | Disposition: A | Discharge status: Home / Self Care | Payer: PRIVATE HEALTH INSURANCE | Attending: Gastroenterology | Admitting: Gastroenterology

## 2018-03-13 ENCOUNTER — Encounter (HOSPITAL_COMMUNITY): Payer: Self-pay | Admitting: Emergency Medicine

## 2018-03-13 ENCOUNTER — Encounter (HOSPITAL_COMMUNITY)
Admission: RE | Disposition: A | Discharge status: Home / Self Care | Payer: Self-pay | Source: Home / Self Care | Attending: Gastroenterology

## 2018-03-13 ENCOUNTER — Other Ambulatory Visit: Payer: Self-pay

## 2018-03-13 DIAGNOSIS — D123 Benign neoplasm of transverse colon: Secondary | ICD-10-CM | POA: Diagnosis not present

## 2018-03-13 DIAGNOSIS — Z1211 Encounter for screening for malignant neoplasm of colon: Secondary | ICD-10-CM | POA: Insufficient documentation

## 2018-03-13 DIAGNOSIS — K449 Diaphragmatic hernia without obstruction or gangrene: Secondary | ICD-10-CM | POA: Insufficient documentation

## 2018-03-13 DIAGNOSIS — Z79899 Other long term (current) drug therapy: Secondary | ICD-10-CM | POA: Insufficient documentation

## 2018-03-13 DIAGNOSIS — D125 Benign neoplasm of sigmoid colon: Secondary | ICD-10-CM | POA: Insufficient documentation

## 2018-03-13 DIAGNOSIS — K3189 Other diseases of stomach and duodenum: Secondary | ICD-10-CM | POA: Insufficient documentation

## 2018-03-13 DIAGNOSIS — Z0000 Encounter for general adult medical examination without abnormal findings: Secondary | ICD-10-CM

## 2018-03-13 DIAGNOSIS — C183 Malignant neoplasm of hepatic flexure: Secondary | ICD-10-CM | POA: Insufficient documentation

## 2018-03-13 DIAGNOSIS — K635 Polyp of colon: Secondary | ICD-10-CM

## 2018-03-13 HISTORY — PX: POLYPECTOMY: SHX5525

## 2018-03-13 HISTORY — PX: EUS: SHX5427

## 2018-03-13 HISTORY — PX: COLONOSCOPY WITH PROPOFOL: SHX5780

## 2018-03-13 HISTORY — PX: ESOPHAGOGASTRODUODENOSCOPY: SHX5428

## 2018-03-13 HISTORY — PX: FINE NEEDLE ASPIRATION: SHX5430

## 2018-03-13 SURGERY — COLONOSCOPY WITH PROPOFOL
Anesthesia: Monitor Anesthesia Care

## 2018-03-13 MED ORDER — LIDOCAINE 2% (20 MG/ML) 5 ML SYRINGE
INTRAMUSCULAR | Status: DC | PRN
  Administered 2018-03-13: 100 mg via INTRAVENOUS

## 2018-03-13 MED ORDER — PROPOFOL 10 MG/ML IV BOLUS
INTRAVENOUS | Status: DC | PRN
  Administered 2018-03-13 (×5): 20 mg via INTRAVENOUS

## 2018-03-13 MED ORDER — PROPOFOL 10 MG/ML IV BOLUS
INTRAVENOUS | Status: AC
  Filled 2018-03-13: qty 20

## 2018-03-13 MED ORDER — SODIUM CHLORIDE 0.9 % IV SOLN: INTRAVENOUS | Status: DC

## 2018-03-13 MED ORDER — LACTATED RINGERS IV SOLN
INTRAVENOUS | Status: DC
  Administered 2018-03-13: 08:00:00 via INTRAVENOUS

## 2018-03-13 MED ORDER — PROPOFOL 500 MG/50ML IV EMUL
INTRAVENOUS | Status: DC | PRN
  Administered 2018-03-13: 125 ug/kg/min via INTRAVENOUS

## 2018-03-13 SURGICAL SUPPLY — 22 items

## 2018-03-13 NOTE — Op Note (Signed)
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 Estimated blood loss:                            None. Estimated Blood Loss:     Estimated blood loss: none. Procedure:                Pre-Anesthesia Assessment:                           - Prior to the procedure, a History and Physical                            was performed, and patient medications and                            allergies were reviewed. The patient's tolerance of                            previous anesthesia was also reviewed. The risks                            and benefits of the procedure and the sedation                            options and risks were discussed with the patient.                            All questions were answered, and informed consent  was obtained. Prior Anticoagulants: The patient has                            taken no previous anticoagulant or antiplatelet                            agents. ASA Grade Assessment: II - A patient with                            mild systemic disease. After reviewing the risks                            and benefits, the patient was deemed in                            satisfactory condition to undergo  the procedure.                           After obtaining informed consent, the endoscope was                            passed under direct vision. Throughout the                            procedure, the patient's blood pressure, pulse, and                            oxygen saturations were monitored continuously. The                            [id number]-[id number] ([id number]) Olympus Radial EUS was                            introduced through the mouth, and advanced to the                            second part of duodenum. Scope In: Scope Out: Findings:      Endoscopic findings:      1. The subepithelial lesion in the proximal stomach appears unchanged.      2. UGI tract otherwise normal.      ENDOSONOGRAPHIC FINDING: :      1. The subepithelial lesion noted above corelated with an oval       intramural (subepithelial) lesion in the cardia of the stomach. The       lesion was hypoechoic. The lesion communicates with the muscularis       propria (Layer 4). The lesion measures 1.1cm by 2.7cm. The outer       endosonographic borders were well defined. The lesion was sampled with 2       passes with FNA needle (22 gauge and 25 gauge). Gastric wall was       otherwise normal.      2. No perigastric adenopathy.      3. Limited views of pancreas, liver, spleen were all normal. Impression:               -  The previously known proximal stomach                            subepithelial lesion is essentially unchaged since                            it was first noted 13 years ago. Currently measures                            2.7cm by 1.1cm and it was sampled by FNA. Await                            final cytology results for further recommendations. Moderate Sedation:      Not Applicable - Patient had care per Anesthesia. Recommendation:           - Discharge patient to home (ambulatory). Procedure Code(s):        --- Professional ---                           [phone], Esophagogastroduodenoscopy,  flexible,                            transoral; with transendoscopic ultrasound-guided                            intramural or transmural fine needle                            aspiration/biopsy(s), (includes endoscopic                            ultrasound examination limited to the esophagus,                            stomach or duodenum, and adjacent structures) Diagnosis Code(s):        --- Professional ---                           K31.89, Other diseases of stomach and duodenum                           R93.3, Abnormal findings on diagnostic imaging of                            other parts of digestive tract CPT copyright 2018 American Medical Association. All rights reserved. The codes documented in this report are preliminary and upon coder review may  be revised to meet current compliance requirements. [name], MD 03/13/2018 9:56:51 AM This report has been signed electronically. Number of Addenda: 0

## 2018-03-13 NOTE — Anesthesia Postprocedure Evaluation (Signed)
Anesthesia Post Note  Patient: Aaron Sharp  Procedure(s) Performed: COLONOSCOPY WITH PROPOFOL (N/A ) UPPER ENDOSCOPIC ULTRASOUND (EUS) RADIAL (N/A ) POLYPECTOMY FINE NEEDLE ASPIRATION (FNA) LINEAR (N/A )     Patient location during evaluation: PACU Anesthesia Type: MAC Level of consciousness: awake and alert Pain management: pain level controlled Vital Signs Assessment: post-procedure vital signs reviewed and stable Respiratory status: spontaneous breathing, nonlabored ventilation and respiratory function stable Cardiovascular status: stable and blood pressure returned to baseline Postop Assessment: no apparent nausea or vomiting Anesthetic complications: no    Last Vitals:  Vitals:   03/13/18 0946 03/13/18 1014  BP: (!) 100/58 122/64  Pulse: 60   Resp: 14   Temp: 36.7 C   SpO2: 100%     Last Pain:  Vitals:   03/13/18 0946  TempSrc: Oral  PainSc: 0-No pain                 Lynda Rainwater

## 2018-03-13 NOTE — Op Note (Signed)
[facility] Patient Name: Aaron Sharp Procedure Date: 03/13/2018 MRN: [id number] Attending MD: [name] , MD Date of Birth: 1965-12-26 CSN: [id number] Age: 52 Admit Type: Outpatient Procedure:                Colonoscopy Indications:              Screening for colorectal malignant neoplasm Providers:                [name], MD, Carmie End, RN, Cletis Athens, Technician, [place] Zenia Resides CRNA, CRNA Referring MD:              Medicines:                Monitored Anesthesia Care Complications:            No immediate complications. Estimated blood loss:                            None. Estimated Blood Loss:     Estimated blood loss: none. Procedure:                Pre-Anesthesia Assessment:                           - Prior to the procedure, a History and Physical                            was performed, and patient medications and                            allergies were reviewed. The patient's tolerance of                            previous anesthesia was also reviewed. The risks                            and benefits of the procedure and the sedation                            options and risks were discussed with the patient.                            All questions were answered, and informed consent                            was obtained. Prior Anticoagulants: The patient has                            taken no previous anticoagulant or antiplatelet                            agents. ASA Grade Assessment: II - A patient with  mild systemic disease. After reviewing the risks                            and benefits, the patient was deemed in                            satisfactory condition to undergo the procedure.                           After obtaining informed consent, the colonoscope                            was passed under direct vision. Throughout the   procedure, the patient's blood pressure, pulse, and                            oxygen saturations were monitored continuously. The                            [id number]-[id number] ([id number]) Olympus Adult Colonoscope was                            introduced through the anus and advanced to the the                            cecum, identified by appendiceal orifice and                            ileocecal valve. The colonoscopy was performed                            without difficulty. The patient tolerated the                            procedure well. The quality of the bowel                            preparation was good. The ileocecal valve,                            appendiceal orifice, and rectum were photographed. Scope In: 8:49:16 AM Scope Out: 9:06:05 AM Scope Withdrawal Time: 0 hours 13 minutes 50 seconds  Total Procedure Duration: 0 hours 16 minutes 49 seconds  Findings:      An 11 mm polyp was found in the hepatic flexure. The polyp was sessile.       The polyp was removed with a hot snare. Resection and retrieval were       complete.      Two sessile polyps were found in the sigmoid colon. The polyps were 1 to       2 mm in size. These polyps were removed with a cold snare. Resection and       retrieval were complete.      The exam was otherwise without abnormality on direct and retroflexion       views. Impression:               -  One 11 mm polyp at the hepatic flexure, removed                            with a hot snare. Resected and retrieved.                           - Two 1 to 2 mm polyps in the sigmoid colon,                            removed with a cold snare. Resected and retrieved.                           - The examination was otherwise normal on direct                            and retroflexion views. Moderate Sedation:      Not Applicable - Patient had care per Anesthesia. Recommendation:           - Patient has a contact number available for                             emergencies. The signs and symptoms of potential                            delayed complications were discussed with the                            patient. Return to normal activities tomorrow.                            Written discharge instructions were provided to the                            patient.                           - Resume previous diet.                           - Continue present medications.                           You will receive a letter within 2-3 weeks with the                            pathology results and my final recommendations.                           If the polyp(s) is proven to be 'pre-cancerous' on                            pathology, you will need repeat colonoscopy in 3-5  years. If the polyp(s) is NOT 'precancerous' on                            pathology then you should repeat colon cancer                            screening in 10 years with colonoscopy without need                            for colon cancer screening by any method prior to                            then (including stool testing). Procedure Code(s):        --- Professional ---                           [phone], Colonoscopy, flexible; with removal of                            tumor(s), polyp(s), or other lesion(s) by snare                            technique Diagnosis Code(s):        --- Professional ---                           Z12.11, Encounter for screening for malignant                            neoplasm of colon                           D12.3, Benign neoplasm of transverse colon (hepatic                            flexure or splenic flexure)                           D12.5, Benign neoplasm of sigmoid colon CPT copyright 2018 American Medical Association. All rights reserved. The codes documented in this report are preliminary and upon coder review may  be revised to meet current compliance requirements. [name], MD 03/13/2018  9:10:09 AM This report has been signed electronically. Number of Addenda: 0

## 2018-03-13 NOTE — Interval H&P Note (Signed)
History and Physical Interval Note:  03/13/2018 7:24 AM  Aaron Sharp  has presented today for surgery, with the diagnosis of colon cancer screen,subepithlial lesion of the stomach  The various methods of treatment have been discussed with the patient and family. After consideration of risks, benefits and other options for treatment, the patient has consented to  Procedure(s): COLONOSCOPY WITH PROPOFOL (N/A) UPPER ENDOSCOPIC ULTRASOUND (EUS) RADIAL (N/A) as a surgical intervention .  The patient's history has been reviewed, patient examined, no change in status, stable for surgery.  I have reviewed the patient's chart and labs.  Questions were answered to the patient's satisfaction.     [name]

## 2018-03-13 NOTE — Anesthesia Procedure Notes (Signed)
Procedure Name: MAC Date/Time: 03/13/2018 8:48 AM Performed by: Lollie Sails, CRNA Pre-anesthesia Checklist: Patient identified, Emergency Drugs available, Suction available, Patient being monitored and Timeout performed Oxygen Delivery Method: Nasal cannula

## 2018-03-13 NOTE — Transfer of Care (Signed)
Immediate Anesthesia Transfer of Care Note  Patient: Aaron Sharp  Procedure(s) Performed: COLONOSCOPY WITH PROPOFOL (N/A ) UPPER ENDOSCOPIC ULTRASOUND (EUS) RADIAL (N/A ) POLYPECTOMY FINE NEEDLE ASPIRATION (FNA) LINEAR (N/A )  Patient Location: PACU and Endoscopy Unit  Anesthesia Type:MAC  Level of Consciousness: drowsy  Airway & Oxygen Therapy: Patient Spontanous Breathing and Patient connected to nasal cannula oxygen  Post-op Assessment: Report given to RN and Post -op Vital signs reviewed and stable  Post vital signs: Reviewed and stable  Last Vitals:  Vitals Value Taken Time  BP    Temp    Pulse    Resp    SpO2      Last Pain:  Vitals:   03/13/18 0728  TempSrc: Oral         Complications: No apparent anesthesia complications

## 2018-03-13 NOTE — Anesthesia Preprocedure Evaluation (Signed)
Anesthesia Evaluation  Patient identified by MRN, date of birth, ID band Patient awake    Reviewed: Allergy & Precautions, NPO status , Patient's Chart, lab work & pertinent test results  Airway Mallampati: II  TM Distance: >3 FB Neck ROM: Full    Dental no notable dental hx.    Pulmonary neg pulmonary ROS,    Pulmonary exam normal breath sounds clear to auscultation       Cardiovascular negative cardio ROS Normal cardiovascular exam Rhythm:Regular Rate:Normal     Neuro/Psych negative neurological ROS  negative psych ROS   GI/Hepatic Neg liver ROS, hiatal hernia,   Endo/Other  negative endocrine ROS  Renal/GU negative Renal ROS  negative genitourinary   Musculoskeletal negative musculoskeletal ROS (+)   Abdominal   Peds negative pediatric ROS (+)  Hematology negative hematology ROS (+)   Anesthesia Other Findings   Reproductive/Obstetrics negative OB ROS                             Anesthesia Physical Anesthesia Plan  ASA: II  Anesthesia Plan: MAC   Post-op Pain Management:    Induction: Intravenous  PONV Risk Score and Plan: 1 and Treatment may vary due to age or medical condition  Airway Management Planned: Nasal Cannula  Additional Equipment:   Intra-op Plan:   Post-operative Plan:   Informed Consent: I have reviewed the patients History and Physical, chart, labs and discussed the procedure including the risks, benefits and alternatives for the proposed anesthesia with the patient or authorized representative who has indicated his/her understanding and acceptance.   Dental advisory given  Plan Discussed with: CRNA  Anesthesia Plan Comments:         Anesthesia Quick Evaluation

## 2018-03-14 ENCOUNTER — Encounter (HOSPITAL_BASED_OUTPATIENT_CLINIC_OR_DEPARTMENT_OTHER): Payer: PRIVATE HEALTH INSURANCE

## 2018-03-14 ENCOUNTER — Encounter (HOSPITAL_COMMUNITY): Payer: Self-pay | Admitting: Gastroenterology

## 2018-03-18 NOTE — Progress Notes (Signed)
  Oncology Nurse Navigator Documentation  Received message from [place] regarding newly diagnosed colon cancer. Called patient to introduced  myself and to let him know what the process would be like moving forward. He understands that if his CT scans show no metastasis he will have surgery first and will see medical oncologist 2-3 weeks after surgery. Patient provided with my contact information for questions or concerns.

## 2018-03-19 ENCOUNTER — Other Ambulatory Visit: Payer: Self-pay

## 2018-03-19 ENCOUNTER — Other Ambulatory Visit (INDEPENDENT_AMBULATORY_CARE_PROVIDER_SITE_OTHER): Payer: PRIVATE HEALTH INSURANCE

## 2018-03-19 DIAGNOSIS — C183 Malignant neoplasm of hepatic flexure: Secondary | ICD-10-CM | POA: Diagnosis not present

## 2018-03-19 DIAGNOSIS — D123 Benign neoplasm of transverse colon: Secondary | ICD-10-CM

## 2018-03-19 DIAGNOSIS — K3189 Other diseases of stomach and duodenum: Secondary | ICD-10-CM

## 2018-03-19 DIAGNOSIS — K635 Polyp of colon: Secondary | ICD-10-CM

## 2018-03-19 DIAGNOSIS — Z1211 Encounter for screening for malignant neoplasm of colon: Secondary | ICD-10-CM

## 2018-03-19 LAB — CBC WITH DIFFERENTIAL/PLATELET
Basophils Absolute: 0 10*3/uL (ref 0.0–0.1)
Basophils Relative: 0.7 % (ref 0.0–3.0)
EOS PCT: 2.6 % (ref 0.0–5.0)
Eosinophils Absolute: 0.1 10*3/uL (ref 0.0–0.7)
HCT: 44.4 % (ref 39.0–52.0)
Hemoglobin: 15.4 g/dL (ref 13.0–17.0)
Lymphocytes Relative: 24.3 % (ref 12.0–46.0)
Lymphs Abs: 1.3 10*3/uL (ref 0.7–4.0)
MCHC: 34.7 g/dL (ref 30.0–36.0)
MCV: 91.1 fl (ref 78.0–100.0)
Monocytes Absolute: 0.8 10*3/uL (ref 0.1–1.0)
Monocytes Relative: 14 % — ABNORMAL HIGH (ref 3.0–12.0)
Neutro Abs: 3.2 10*3/uL (ref 1.4–7.7)
Neutrophils Relative %: 58.4 % (ref 43.0–77.0)
PLATELETS: 184 10*3/uL (ref 150.0–400.0)
RBC: 4.87 Mil/uL (ref 4.22–5.81)
RDW: 12.4 % (ref 11.5–15.5)
WBC: 5.4 10*3/uL (ref 4.0–10.5)

## 2018-03-19 LAB — COMPREHENSIVE METABOLIC PANEL
ALT: 24 U/L (ref 0–53)
AST: 21 U/L (ref 0–37)
Albumin: 4.7 g/dL (ref 3.5–5.2)
Alkaline Phosphatase: 74 U/L (ref 39–117)
BUN: 13 mg/dL (ref 6–23)
CO2: 28 mEq/L (ref 19–32)
Calcium: 10.1 mg/dL (ref 8.4–10.5)
Chloride: 102 mEq/L (ref 96–112)
Creatinine, Ser: 1.17 mg/dL (ref 0.40–1.50)
GFR: 70.05 mL/min (ref >60.00)
GLUCOSE: 89 mg/dL (ref 70–99)
Potassium: 4.6 mEq/L (ref 3.5–5.1)
Sodium: 137 mEq/L (ref 135–145)
Total Bilirubin: 0.5 mg/dL (ref 0.2–1.2)
Total Protein: 7.9 g/dL (ref 6.0–8.3)

## 2018-03-19 MED ORDER — PEG 3350-KCL-NA BICARB-NACL 420 G PO SOLR: 4000.0000 mL | Freq: Once | ORAL | 0 refills | Status: AC

## 2018-03-19 NOTE — Progress Notes (Signed)
You have been scheduled for a CT scan of the chest, abdomen and pelvis at [facility] ([address]---this is in the same building as [facility]).   You are scheduled on 03/20/18 at 1130 am. You should arrive 15 minutes prior to your appointment time for registration. Please follow the written instructions below on the day of your exam:  WARNING: IF YOU ARE ALLERGIC TO IODINE/X-RAY DYE, PLEASE NOTIFY RADIOLOGY IMMEDIATELY AT [phone]! YOU WILL BE GIVEN A 13 HOUR PREMEDICATION PREP.  1) Do not eat or drink anything after 730 am (4 hours prior to your test) 2) You have been given 2 bottles of oral contrast to drink. The solution may taste better if refrigerated, but do NOT add ice or any other liquid to this solution. Shake well before drinking.   Drink 1 bottle of contrast @ 930 am (2 hours prior to your exam) Drink 1 bottle of contrast @ 1030 am (1 hour prior to your exam)  You may take any medications as prescribed with a small amount of water, if necessary. If you take any of the following medications: METFORMIN, GLUCOPHAGE, GLUCOVANCE, AVANDAMET, RIOMET, FORTAMET, [place] MET, JANUMET, GLUMETZA or METAGLIP, you MAY be asked to HOLD this medication 48 hours AFTER the exam.  The purpose of you drinking the oral contrast is to aid in the visualization of your intestinal tract. The contrast solution may cause some diarrhea. Depending on your individual set of symptoms, you may also receive an intravenous injection of x-ray contrast/dye. Plan on being at [facility] for 30 minutes or longer, depending on the type of exam you are having performed.  This test typically takes 30-45 minutes to complete.  If you have any questions regarding your exam or if you need to reschedule, you may call the CT department at [phone] between the hours of 8:00 am and 5:00 pm, Monday-Friday.  ___________________________________

## 2018-03-20 ENCOUNTER — Ambulatory Visit (INDEPENDENT_AMBULATORY_CARE_PROVIDER_SITE_OTHER)
Admission: RE | Admit: 2018-03-20 | Discharge: 2018-03-20 | Disposition: A | Discharge status: Home / Self Care | Payer: PRIVATE HEALTH INSURANCE | Source: Ambulatory Visit | Attending: Gastroenterology | Admitting: Gastroenterology

## 2018-03-20 ENCOUNTER — Inpatient Hospital Stay: Admission: RE | Admit: 2018-03-20 | Payer: PRIVATE HEALTH INSURANCE | Source: Ambulatory Visit

## 2018-03-20 ENCOUNTER — Other Ambulatory Visit: Payer: PRIVATE HEALTH INSURANCE

## 2018-03-20 DIAGNOSIS — C183 Malignant neoplasm of hepatic flexure: Secondary | ICD-10-CM

## 2018-03-20 MED ORDER — IOPAMIDOL (ISOVUE-300) INJECTION 61%
100.0000 mL | Freq: Once | INTRAVENOUS | Status: AC | PRN
  Administered 2018-03-20: 100 mL via INTRAVENOUS

## 2018-03-21 ENCOUNTER — Other Ambulatory Visit: Payer: Self-pay

## 2018-03-21 DIAGNOSIS — C189 Malignant neoplasm of colon, unspecified: Secondary | ICD-10-CM | POA: Insufficient documentation

## 2018-03-21 LAB — CEA: CEA: 0.5 ng/mL

## 2018-03-21 MED ORDER — LEVOFLOXACIN 500 MG PO TABS: 500.0000 mg | ORAL_TABLET | Freq: Every day | ORAL | 0 refills | Status: AC

## 2018-03-24 ENCOUNTER — Ambulatory Visit (AMBULATORY_SURGERY_CENTER): Payer: PRIVATE HEALTH INSURANCE | Admitting: Gastroenterology

## 2018-03-24 ENCOUNTER — Encounter: Payer: Self-pay | Admitting: Gastroenterology

## 2018-03-24 VITALS — BP 118/74 | HR 64 | Temp 97.5°F | Resp 19 | Ht 72.0 in | Wt 206.0 lb

## 2018-03-24 DIAGNOSIS — C183 Malignant neoplasm of hepatic flexure: Secondary | ICD-10-CM

## 2018-03-24 MED ORDER — SODIUM CHLORIDE 0.9 % IV SOLN: 500.0000 mL | Freq: Once | INTRAVENOUS | Status: DC

## 2018-03-24 NOTE — Progress Notes (Signed)
Report given to PACU, vss 

## 2018-03-24 NOTE — Progress Notes (Signed)
Called to room to assist during endoscopic procedure.  Patient ID and intended procedure confirmed with present staff. Received instructions for my participation in the procedure from the performing physician.  

## 2018-03-24 NOTE — Op Note (Signed)
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 The patient's tolerance of                            previous anesthesia was also reviewed. The risks                            and benefits of the procedure and the sedation                            options and risks were discussed with the patient.                            All questions were answered, and informed consent                            was obtained. Prior Anticoagulants: The patient has                            taken no previous anticoagulant or antiplatelet                            agents. ASA Grade Assessment: II - A patient with                            mild systemic disease. After reviewing the risks                            and benefits, the patient was deemed in  satisfactory condition to undergo the procedure.                           After obtaining informed consent, the colonoscope                            was passed under direct vision. Throughout the                            procedure, the patient's blood pressure, pulse, and                            oxygen saturations were monitored continuously. The                            Colonoscope was introduced  through the anus and                            advanced to the the cecum, identified by                            appendiceal orifice and ileocecal valve. The                            colonoscopy was performed without difficulty. The                            patient tolerated the procedure well. The quality                            of the bowel preparation was excellent. The                            ileocecal valve, appendiceal orifice, and rectum                            were photographed. Scope In: 3:03:04 PM Scope Out: 3:18:22 PM Scope Withdrawal Time: 0 hours 12 minutes 39 seconds  Total Procedure Duration: 0 hours 15 minutes 18 seconds  Findings:                 The site of the recent hepatic flexure snare                            polypectomy was easily located by the healing,                            clean based ulcer. I labeled the site with three                            submucosal injections (see images).                           The exam was otherwise without abnormality on  direct and retroflexion views. Complications:            No immediate complications. Estimated blood loss:                            None. Estimated Blood Loss:     none Impression:               - The site of the recent hepatic flexure snare                            polypectomy was easily located by the healing,                            clean based ulcer. I labeled the site with three                            submucosal injections (see images).                           - The examination was otherwise normal on direct                            and retroflexion views.                           - No specimens collected. Recommendation:           - Patient has a contact number available for                            emergencies. The signs and symptoms of potential                            delayed complications were discussed with the                             patient. Return to normal activities tomorrow.                            Written discharge instructions were provided to the                            patient.                           - Resume previous diet.                           - Continue present medications.                           - Continue with plans to meet a surgeon to consider                            elective segmental colectomy. [name], MD 03/24/2018 3:27:06 PM This report has been signed electronically.

## 2018-03-24 NOTE — Patient Instructions (Signed)
Discharge instructions given. Resume previous medications. YOU HAD AN ENDOSCOPIC PROCEDURE TODAY AT [facility]:   Refer to the procedure report that was given to you for any specific questions about what was found during the examination.  If the procedure report does not answer your questions, please call your gastroenterologist to clarify.  If you requested that your care partner not be given the details of your procedure findings, then the procedure report has been included in a sealed envelope for you to review at your convenience later.  YOU SHOULD EXPECT: Some feelings of bloating in the abdomen. Passage of more gas than usual.  Walking can help get rid of the air that was put into your GI tract during the procedure and reduce the bloating. If you had a lower endoscopy (such as a colonoscopy or flexible sigmoidoscopy) you may notice spotting of blood in your stool or on the toilet paper. If you underwent a bowel prep for your procedure, you may not have a normal bowel movement for a few days.  Please Note:  You might notice some irritation and congestion in your nose or some drainage.  This is from the oxygen used during your procedure.  There is no need for concern and it should clear up in a day or so.  SYMPTOMS TO REPORT IMMEDIATELY:   Following lower endoscopy (colonoscopy or flexible sigmoidoscopy):  Excessive amounts of blood in the stool  Significant tenderness or worsening of abdominal pains  Swelling of the abdomen that is new, acute  Fever of 100F or higher   For urgent or emergent issues, a gastroenterologist can be reached at any hour by calling [phone].   DIET:  We do recommend a small meal at first, but then you may proceed to your regular diet.  Drink plenty of fluids but you should avoid alcoholic beverages for 24 hours.  ACTIVITY:  You should plan to take it easy for the rest of today and you should NOT DRIVE or use heavy machinery until tomorrow  (because of the sedation medicines used during the test).    FOLLOW UP: Our staff will call the number listed on your records the next business day following your procedure to check on you and address any questions or concerns that you may have regarding the information given to you following your procedure. If we do not reach you, we will leave a message.  However, if you are feeling well and you are not experiencing any problems, there is no need to return our call.  We will assume that you have returned to your regular daily activities without incident.  If any biopsies were taken you will be contacted by phone or by letter within the next 1-3 weeks.  Please call us at [phone] if you have not heard about the biopsies in 3 weeks.    SIGNATURES/CONFIDENTIALITY: You and/or your care partner have signed paperwork which will be entered into your electronic medical record.  These signatures attest to the fact that that the information above on your After Visit Summary has been reviewed and is understood.  Full responsibility of the confidentiality of this discharge information lies with you and/or your care-partner.

## 2018-03-24 NOTE — Progress Notes (Signed)
Per Dr. Ardis Hughs not path specimens taken.  Injected a total of 2.5 ml of spot in three areas of the hepatic flexure prior polypectomy site. maw

## 2018-03-25 ENCOUNTER — Telehealth: Payer: Self-pay

## 2018-03-25 NOTE — Telephone Encounter (Signed)
Patient returning call. Patient states he is doing great with no questions or concerns at this time.

## 2018-03-25 NOTE — Telephone Encounter (Signed)
  Follow up Call-  Call back number 03/24/2018  Post procedure Call Back phone  # [phone]  Permission to leave phone message Yes  Some recent data might be hidden     No answer

## 2018-03-30 ENCOUNTER — Encounter (HOSPITAL_BASED_OUTPATIENT_CLINIC_OR_DEPARTMENT_OTHER): Payer: PRIVATE HEALTH INSURANCE

## 2018-04-08 ENCOUNTER — Ambulatory Visit: Payer: Self-pay | Admitting: General Surgery

## 2018-04-08 NOTE — H&P (View-Only) (Signed)
History of Present Illness Leighton Ruff MD; 04/03/4578 10:32 AM) The patient is a 53 year old male who presents with colorectal cancer. 53 year old male who underwent a screening colonoscopy who was noted to have a polyp at the hepatic flexure. 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 He then underwent repeat colonoscopy in tattoo placement. He is here today to discuss definitive surgical management. He reports regular bowel habits and denies any chronic diarrhea or blood in his stool. He denies any weight loss. He has no major medical problems.   Past Surgical History Mammie Lorenzo, LPN; 12/09/8336 25:05 AM) Shoulder Surgery Right.  Diagnostic Studies History Mammie Lorenzo, LPN; 06/09/7671 41:93 AM) Colonoscopy within last year  Allergies Mammie Lorenzo, LPN; 10/09/238 97:35 AM) No Known Allergies [04/08/2018]:  Medication History Mammie Lorenzo, LPN; 06/02/9922 26:83 AM) Aspirin (81MG  Tablet, Oral) Active. Vitamin D (1000UNIT Tablet, Oral) Active. Medications Reconciled  Social History Mammie Lorenzo, LPN; 07/01/9620 29:79 AM) Alcohol use Moderate alcohol use. Caffeine use Coffee, Tea. Tobacco use Never smoker.  Family History Mammie Lorenzo, LPN; 11/08/2117 41:74 AM) Alcohol Abuse Brother, Father. Arthritis Father, Mother. Depression Mother, Sister. Prostate Cancer Father.  Other Problems Mammie Lorenzo, LPN; 0/11/1446 18:56 AM) Colon Cancer     Review of Systems Mammie Lorenzo LPN; 05/31/4968 26:37 AM) General Not Present- Appetite Loss, Chills, Fatigue, Fever, Night Sweats, Weight Gain and Weight Loss. Skin Not Present- Change in Wart/Mole, Dryness, Hives, Jaundice, New Lesions, Non-Healing Wounds, Rash and Ulcer. HEENT Present- Ringing in the Ears. Not Present- Earache, Hearing Loss, Hoarseness, Nose Bleed, Oral Ulcers, Seasonal Allergies, Sinus Pain, Sore Throat, Visual Disturbances, Wears glasses/contact lenses and Yellow  Eyes. Respiratory Present- Snoring. Not Present- Bloody sputum, Chronic Cough, Difficulty Breathing and Wheezing. Breast Not Present- Breast Mass, Breast Pain, Nipple Discharge and Skin Changes. Cardiovascular Not Present- Chest Pain, Difficulty Breathing Lying Down, Leg Cramps, Palpitations, Rapid Heart Rate, Shortness of Breath and Swelling of Extremities. Gastrointestinal Not Present- Abdominal Pain, Bloating, Bloody Stool, Change in Bowel Habits, Chronic diarrhea, Constipation, Difficulty Swallowing, Excessive gas, Gets full quickly at meals, Hemorrhoids, Indigestion, Nausea, Rectal Pain and Vomiting. Male Genitourinary Not Present- Blood in Urine, Change in Urinary Stream, Frequency, Impotence, Nocturia, Painful Urination, Urgency and Urine Leakage. Musculoskeletal Not Present- Back Pain, Joint Pain, Joint Stiffness, Muscle Pain, Muscle Weakness and Swelling of Extremities. Neurological Not Present- Decreased Memory, Fainting, Headaches, Numbness, Seizures, Tingling, Tremor, Trouble walking and Weakness. Psychiatric Not Present- Anxiety, Bipolar, Change in Sleep Pattern, Depression, Fearful and Frequent crying. Endocrine Not Present- Cold Intolerance, Excessive Hunger, Hair Changes, Heat Intolerance, Hot flashes and New Diabetes. Hematology Not Present- Blood Thinners, Easy Bruising, Excessive bleeding, Gland problems, HIV and Persistent Infections.  Vitals Claiborne Billings Dockery LPN; 11/04/8848 27:74 AM) 04/08/2018 10:11 AM Weight: 206.4 lb Height: 72in Body Surface Area: 2.16 m Body Mass Index: 27.99 kg/m  Temp.: 9F(Temporal)  Pulse: 69 (Regular)  BP: 120/72 (Sitting, Right Arm, Standard)      Physical Exam Leighton Ruff MD; 04/03/8784 10:34 AM)  General Mental Status-Alert. General Appearance-Not in acute distress. Build & Nutrition-Well nourished. Posture-Normal posture. Gait-Normal.  Head and Neck Head-normocephalic, atraumatic with no lesions or palpable  masses. Trachea-midline.  Chest and Lung Exam Chest and lung exam reveals -on auscultation, normal breath sounds, no adventitious sounds and normal vocal resonance.  Cardiovascular Cardiovascular examination reveals -normal heart sounds, regular rate and rhythm with no murmurs and no digital clubbing, cyanosis, edema, increased warmth or tenderness.  Abdomen Inspection Inspection of the abdomen reveals - No Hernias.  Palpation/Percussion Palpation and Percussion of the abdomen reveal - Soft, Non Tender, No Rigidity (guarding), No hepatosplenomegaly and No Palpable abdominal masses.  Neurologic Neurologic evaluation reveals -alert and oriented x 3 with no impairment of recent or remote memory, normal attention span and ability to concentrate, normal sensation and normal coordination.  Musculoskeletal Normal Exam - Bilateral-Upper Extremity Strength Normal and Lower Extremity Strength Normal.    Assessment & Plan Leighton Ruff MD; 09/06/7032 10:31 AM)  COLON CANCER, ASCENDING (C18.2) Impression: 53 year old male who underwent recent colonoscopy with screening who presents to the office for evaluation of a colon polyp resected from the hepatic flexure which showed invasive adenocarcinoma positive at the margins. CT scans and CEA show no signs of metastatic disease. Given his age and pathology findings, I have recommended a completion right colectomy for final staging. We have discussed this in detail and all questions have been answered. The surgery and anatomy were described to the patient as well as the risks of surgery and the possible complications. These include: Bleeding, deep abdominal infections and possible wound complications such as hernia and infection, damage to adjacent structures, leak of surgical connections, which can lead to other surgeries and possibly an ostomy, possible need for other procedures, such as abscess drains in radiology, possible prolonged hospital  stay, possible diarrhea from removal of part of the colon, possible constipation from narcotics, possible bowel dysfunction, prolonged fatigue/weakness or appetite loss, possible early recurrence of of disease, possible complications of their medical problems such as heart disease or arrhythmias or lung problems, death (less than 1%). I believe the patient understands and wishes to proceed with the surgery.

## 2018-04-08 NOTE — H&P (Signed)
History of Present Illness Aaron Ruff MD; 07/07/8293 10:32 AM) The patient is a 53 year old male who presents with colorectal cancer. 53 year old male who underwent a screening colonoscopy who was noted to have a polyp at the hepatic flexure. 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 He then underwent repeat colonoscopy in tattoo placement. He is here today to discuss definitive surgical management. He reports regular bowel habits and denies any chronic diarrhea or blood in his stool. He denies any weight loss. He has no major medical problems.   Past Surgical History Mammie Lorenzo, LPN; 09/02/1306 65:78 AM) Shoulder Surgery Right.  Diagnostic Studies History Mammie Lorenzo, LPN; 07/07/9627 52:84 AM) Colonoscopy within last year  Allergies Mammie Lorenzo, LPN; 04/04/2438 10:27 AM) No Known Allergies [04/08/2018]:  Medication History Mammie Lorenzo, LPN; 05/07/3662 40:34 AM) Aspirin (81MG  Tablet, Oral) Active. Vitamin D (1000UNIT Tablet, Oral) Active. Medications Reconciled  Social History Mammie Lorenzo, LPN; 10/03/2593 63:87 AM) Alcohol use Moderate alcohol use. Caffeine use Coffee, Tea. Tobacco use Never smoker.  Family History Mammie Lorenzo, LPN; 08/06/4330 95:18 AM) Alcohol Abuse Brother, Father. Arthritis Father, Mother. Depression Mother, Sister. Prostate Cancer Father.  Other Problems Mammie Lorenzo, LPN; 11/04/1658 63:01 AM) Colon Cancer     Review of Systems Mammie Lorenzo LPN; 6/0/1093 23:55 AM) General Not Present- Appetite Loss, Chills, Fatigue, Fever, Night Sweats, Weight Gain and Weight Loss. Skin Not Present- Change in Wart/Mole, Dryness, Hives, Jaundice, New Lesions, Non-Healing Wounds, Rash and Ulcer. HEENT Present- Ringing in the Ears. Not Present- Earache, Hearing Loss, Hoarseness, Nose Bleed, Oral Ulcers, Seasonal Allergies, Sinus Pain, Sore Throat, Visual Disturbances, Wears glasses/contact lenses and Yellow  Eyes. Respiratory Present- Snoring. Not Present- Bloody sputum, Chronic Cough, Difficulty Breathing and Wheezing. Breast Not Present- Breast Mass, Breast Pain, Nipple Discharge and Skin Changes. Cardiovascular Not Present- Chest Pain, Difficulty Breathing Lying Down, Leg Cramps, Palpitations, Rapid Heart Rate, Shortness of Breath and Swelling of Extremities. Gastrointestinal Not Present- Abdominal Pain, Bloating, Bloody Stool, Change in Bowel Habits, Chronic diarrhea, Constipation, Difficulty Swallowing, Excessive gas, Gets full quickly at meals, Hemorrhoids, Indigestion, Nausea, Rectal Pain and Vomiting. Male Genitourinary Not Present- Blood in Urine, Change in Urinary Stream, Frequency, Impotence, Nocturia, Painful Urination, Urgency and Urine Leakage. Musculoskeletal Not Present- Back Pain, Joint Pain, Joint Stiffness, Muscle Pain, Muscle Weakness and Swelling of Extremities. Neurological Not Present- Decreased Memory, Fainting, Headaches, Numbness, Seizures, Tingling, Tremor, Trouble walking and Weakness. Psychiatric Not Present- Anxiety, Bipolar, Change in Sleep Pattern, Depression, Fearful and Frequent crying. Endocrine Not Present- Cold Intolerance, Excessive Hunger, Hair Changes, Heat Intolerance, Hot flashes and New Diabetes. Hematology Not Present- Blood Thinners, Easy Bruising, Excessive bleeding, Gland problems, HIV and Persistent Infections.  Vitals Claiborne Billings Dockery LPN; 10/02/2200 54:27 AM) 04/08/2018 10:11 AM Weight: 206.4 lb Height: 72in Body Surface Area: 2.16 m Body Mass Index: 27.99 kg/m  Temp.: 37F(Temporal)  Pulse: 69 (Regular)  BP: 120/72 (Sitting, Right Arm, Standard)      Physical Exam Aaron Ruff MD; 0/09/2374 10:34 AM)  General Mental Status-Alert. General Appearance-Not in acute distress. Build & Nutrition-Well nourished. Posture-Normal posture. Gait-Normal.  Head and Neck Head-normocephalic, atraumatic with no lesions or palpable  masses. Trachea-midline.  Chest and Lung Exam Chest and lung exam reveals -on auscultation, normal breath sounds, no adventitious sounds and normal vocal resonance.  Cardiovascular Cardiovascular examination reveals -normal heart sounds, regular rate and rhythm with no murmurs and no digital clubbing, cyanosis, edema, increased warmth or tenderness.  Abdomen Inspection Inspection of the abdomen reveals - No Hernias.  Palpation/Percussion Palpation and Percussion of the abdomen reveal - Soft, Non Tender, No Rigidity (guarding), No hepatosplenomegaly and No Palpable abdominal masses.  Neurologic Neurologic evaluation reveals -alert and oriented x 3 with no impairment of recent or remote memory, normal attention span and ability to concentrate, normal sensation and normal coordination.  Musculoskeletal Normal Exam - Bilateral-Upper Extremity Strength Normal and Lower Extremity Strength Normal.    Assessment & Plan Aaron Ruff MD; 04/07/6151 10:31 AM)  COLON CANCER, ASCENDING (C18.2) Impression: 53 year old male who underwent recent colonoscopy with screening who presents to the office for evaluation of a colon polyp resected from the hepatic flexure which showed invasive adenocarcinoma positive at the margins. CT scans and CEA show no signs of metastatic disease. Given his age and pathology findings, I have recommended a completion right colectomy for final staging. We have discussed this in detail and all questions have been answered. The surgery and anatomy were described to the patient as well as the risks of surgery and the possible complications. These include: Bleeding, deep abdominal infections and possible wound complications such as hernia and infection, damage to adjacent structures, leak of surgical connections, which can lead to other surgeries and possibly an ostomy, possible need for other procedures, such as abscess drains in radiology, possible prolonged hospital  stay, possible diarrhea from removal of part of the colon, possible constipation from narcotics, possible bowel dysfunction, prolonged fatigue/weakness or appetite loss, possible early recurrence of of disease, possible complications of their medical problems such as heart disease or arrhythmias or lung problems, death (less than 1%). I believe the patient understands and wishes to proceed with the surgery.

## 2018-04-17 ENCOUNTER — Encounter: Payer: Self-pay | Admitting: Family Medicine

## 2018-04-17 ENCOUNTER — Ambulatory Visit (INDEPENDENT_AMBULATORY_CARE_PROVIDER_SITE_OTHER): Payer: PRIVATE HEALTH INSURANCE | Admitting: Family Medicine

## 2018-04-17 VITALS — BP 120/82 | HR 60 | Temp 98.0°F | Ht 72.0 in | Wt 202.1 lb

## 2018-04-17 DIAGNOSIS — J40 Bronchitis, not specified as acute or chronic: Secondary | ICD-10-CM

## 2018-04-17 DIAGNOSIS — R05 Cough: Secondary | ICD-10-CM | POA: Diagnosis not present

## 2018-04-17 DIAGNOSIS — R059 Cough, unspecified: Secondary | ICD-10-CM

## 2018-04-17 HISTORY — DX: Bronchitis, not specified as acute or chronic: J40

## 2018-04-17 MED ORDER — AZITHROMYCIN 250 MG PO TABS: ORAL_TABLET | ORAL | 0 refills | Status: DC

## 2018-04-17 NOTE — Progress Notes (Signed)
Patient ID: Aaron Sharp, male   DOB: [birth year], 53 y.o.   MRN: [id number]  PCP: Aaron Anger, MD  Subjective:  Aaron Sharp is a 53 y.o. year old very pleasant male patient who presents with symptoms including nasal congestion, chest congestion, cough that is described as dry,  Upcoming surgery 05/01/2018 colectomy  -started: over 1 week ago, symptoms show no change -previous treatments: Mucinex and pseudoephedrine -sick contacts/travel/risks: denies flu exposure. Reports that wife has had similar symptoms and was treated and now improved   He reports not feeling well after endoscopy in December 2019 and after CT chest on 03/20/2018 he was prescribed an antibiotic due to findings of nonspecific inflammatory or infectious etiology. He did not take the antibiotic and stated his "cold symptoms" improved. He reports reading side effects of antibiotic which concerned him so he waited to see if improvement would occur. He did improve   ROS-denies fever, SOB, NVD, tooth pain  Pertinent Past Medical History- Malignant neoplasm of hepatic flexure  Medications- reviewed  Current Outpatient Medications  Medication Sig Dispense Refill  . ibuprofen (ADVIL,MOTRIN) 200 MG tablet Take 400 mg by mouth every 6 (six) hours as needed for moderate pain.     No current facility-administered medications for this visit.     Objective: BP 120/82 (BP Location: Left Arm, Patient Position: Sitting, Cuff Size: Normal)   Pulse 60   Temp 98 F (36.7 C) (Oral)   Ht 6' (1.829 m)   Wt 202 lb 1 oz (91.7 kg)   SpO2 98%   BMI 27.40 kg/m  Gen: NAD, resting comfortably HEENT: Turbinates mildly erythematous, TMs normal bilaterally, oropharynx is clear and moist,  no sinus tenderness CV: RRR no murmurs rubs or gallops Lungs: CTAB no crackles, wheeze, rhonchi Abdomen: soft/nontender/nondistended/normal bowel sounds. No rebound or guarding.  Ext: no edema Skin: warm, dry, no rash Neuro: grossly normal,  moves all extremities  Assessment/Plan: 1. Cough Patient's symptoms most consistent with bronchitis. Discussed likely viral nature and less likely bacterial cause. Benign lung exam and stable vital signs make pneumonia unlikely. We discussed treatment options and with upcoming surgery and history of findings in CT and patient did not take antibiotic course that was originally prescribed after CT findings, opted to treated with azithromycin today. Advised that if his symptoms were to worsen he should let us know. Further advised him to notify surgeon's office that he is now taking this antibiotic. He will use Delsym for cough. Close return precautions provided.  - azithromycin (ZITHROMAX) 250 MG tablet; Take 2 tablets by mouth at once today, then one tablet daily x 4 days.  Dispense: 6 tablet; Refill: 0   Finally, we reviewed reasons to return to care including if symptoms worsen or persist or new concerns arise- once again particularly shortness of breath or fever.    Aaron Quint, FNP

## 2018-04-17 NOTE — Patient Instructions (Signed)
Get rest, drink plenty of fluids, and follow up if fever >101, if symptoms worsen or if symptoms are not improved in 3 to 4  days.   Please let your surgeon know that you are taking this medication.  Symptoms are most consistent with bronchitis.    Acute Bronchitis, Adult Acute bronchitis is when air tubes (bronchi) in the lungs suddenly get swollen. The condition can make it hard to breathe. It can also cause these symptoms:  A cough.  Coughing up clear, yellow, or green mucus.  Wheezing.  Chest congestion.  Shortness of breath.  A fever.  Body aches.  Chills.  A sore throat. Follow these instructions at home:  Medicines  Take over-the-counter and prescription medicines only as told by your doctor.  If you were prescribed an antibiotic medicine, take it as told by your doctor. Do not stop taking the antibiotic even if you start to feel better. General instructions  Rest.  Drink enough fluids to keep your pee (urine) pale yellow.  Avoid smoking and secondhand smoke. If you smoke and you need help quitting, ask your doctor. Quitting will help your lungs heal faster.  Use an inhaler, cool mist vaporizer, or humidifier as told by your doctor.  Keep all follow-up visits as told by your doctor. This is important. How is this prevented? To lower your risk of getting this condition again:  Wash your hands often with soap and water. If you cannot use soap and water, use hand sanitizer.  Avoid contact with people who have cold symptoms.  Try not to touch your hands to your mouth, nose, or eyes.  Make sure to get the flu shot every year. Contact a doctor if:  Your symptoms do not get better in 2 weeks. Get help right away if:  You cough up blood.  You have chest pain.  You have very bad shortness of breath.  You become dehydrated.  You faint (pass out) or keep feeling like you are going to pass out.  You keep throwing up (vomiting).  You have a very bad  headache.  Your fever or chills gets worse. This information is not intended to replace advice given to you by your health care provider. Make sure you discuss any questions you have with your health care provider. Document Released: 09/05/2007 Document Revised: 10/31/2016 Document Reviewed: 09/07/2015 Elsevier Interactive Patient Education  2019 [employer].

## 2018-04-18 ENCOUNTER — Ambulatory Visit: Payer: PRIVATE HEALTH INSURANCE | Admitting: Family

## 2018-04-24 NOTE — Patient Instructions (Addendum)
Aaron Sharp  04/24/2018   Your procedure is scheduled on: Thursday 05/01/2018  Report to [facility] Main  Entrance              Report to admitting at   0530 AM    Call this number if you have problems the morning of surgery [phone]               Follow Bowel Prep instructions from Dr. [name] and drink a clear liquid diet all day.  DRINK 2 PRESURGERY ENSURE DRINKS THE NIGHT BEFORE SURGERY AT  1000 PM AND 1 PRESURGERY DRINK THE DAY OF THE PROCEDURE 3 HOURS PRIOR TO SCHEDULED SURGERY. NO SOLIDS AFTER MIDNIGHT THE DAY PRIOR TO THE SURGERY. NOTHING BY MOUTH EXCEPT CLEAR LIQUIDS UNTIL THREE HOURS PRIOR TO SCHEDULED SURGERY. PLEASE FINISH PRESURGERY ENSURE DRINK PER SURGEON ORDER 3 HOURS PRIOR TO SCHEDULED SURGERY TIME WHICH NEEDS TO BE COMPLETED AT   0430 am.     CLEAR LIQUID DIET   Foods Allowed                                                                     Foods Excluded  Coffee and tea, regular and decaf                             liquids that you cannot  Plain Jell-O in any flavor                                             see through such as: Fruit ices (not with fruit pulp)                                     milk, soups, orange juice  Iced Popsicles                                    All solid food Carbonated beverages, regular and diet                                    Cranberry, grape and apple juices Sports drinks like Gatorade Lightly seasoned clear broth or consume(fat free) Sugar, honey syrup  Sample Menu Breakfast                                Lunch                                     Supper Cranberry juice                    Beef broth  Chicken broth Jell-O                                     Grape juice                           Apple juice Coffee or tea                        Jell-O                                      Popsicle                                                Coffee or tea                         Coffee or tea  _____________________________________________________________________                  BRUSH YOUR TEETH MORNING OF SURGERY AND RINSE YOUR MOUTH OUT, NO CHEWING GUM CANDY OR MINTS.     Take these medicines the morning of surgery with A SIP OF WATER: none                                You may not have any metal on your body including hair pins and              piercings  Do not wear jewelry, make-up, lotions, powders or perfumes, deodorant                        Men may shave face and neck.   Do not bring valuables to the hospital. [place].  Contacts, dentures or bridgework may not be worn into surgery.  Leave suitcase in the car. After surgery it may be brought to your room.                  Please read over the following fact sheets you were given: _____________________________________________________________________             [facility] - Preparing for Surgery Before surgery, you can play an important role.  Because skin is not sterile, your skin needs to be as free of germs as possible.  You can reduce the number of germs on your skin by washing with CHG (chlorahexidine gluconate) soap before surgery.  CHG is an antiseptic cleaner which kills germs and bonds with the skin to continue killing germs even after washing. Please DO NOT use if you have an allergy to CHG or antibacterial soaps.  If your skin becomes reddened/irritated stop using the CHG and inform your nurse when you arrive at Short Stay. Do not shave (including legs and underarms) for at least 48 hours prior to the first CHG shower.  You may shave your face/neck. Please follow these instructions carefully:  1.  Shower with CHG Soap  the night before surgery and the  morning of Surgery.  2.  If you choose to wash your hair, wash your hair first as usual with your  normal  shampoo.  3.  After you shampoo, rinse your hair and body  thoroughly to remove the  shampoo.                           4.  Use CHG as you would any other liquid soap.  You can apply chg directly  to the skin and wash                       Gently with a scrungie or clean washcloth.  5.  Apply the CHG Soap to your body ONLY FROM THE NECK DOWN.   Do not use on face/ open                           Wound or open sores. Avoid contact with eyes, ears mouth and genitals (private parts).                       Wash face,  Genitals (private parts) with your normal soap.             6.  Wash thoroughly, paying special attention to the area where your surgery  will be performed.  7.  Thoroughly rinse your body with warm water from the neck down.  8.  DO NOT shower/wash with your normal soap after using and rinsing off  the CHG Soap.                9.  Pat yourself dry with a clean towel.            10.  Wear clean pajamas.            11.  Place clean sheets on your bed the night of your first shower and do not  sleep with pets. Day of Surgery : Do not apply any lotions/deodorants the morning of surgery.  Please wear clean clothes to the hospital/surgery center.  FAILURE TO FOLLOW THESE INSTRUCTIONS MAY RESULT IN THE CANCELLATION OF YOUR SURGERY PATIENT SIGNATURE_________________________________  NURSE SIGNATURE__________________________________  ________________________________________________________________________   [name]  An incentive spirometer is a tool that can help keep your lungs clear and active. This tool measures how well you are filling your lungs with each breath. Taking long deep breaths may help reverse or decrease the chance of developing breathing (pulmonary) problems (especially infection) following:  A long period of time when you are unable to move or be active. BEFORE THE PROCEDURE   If the spirometer includes an indicator to show your best effort, your nurse or respiratory therapist will set it to a desired goal.  If  possible, sit up straight or lean slightly forward. Try not to slouch.  Hold the incentive spirometer in an upright position. INSTRUCTIONS FOR USE  1. Sit on the edge of your bed if possible, or sit up as far as you can in bed or on a chair. 2. Hold the incentive spirometer in an upright position. 3. Breathe out normally. 4. Place the mouthpiece in your mouth and seal your lips tightly around it. 5. Breathe in slowly and as deeply as possible, raising the piston or the ball toward the top of the column. 6. Hold your breath for  3-5 seconds or for as long as possible. Allow the piston or ball to fall to the bottom of the column. 7. Remove the mouthpiece from your mouth and breathe out normally. 8. Rest for a few seconds and repeat Steps 1 through 7 at least 10 times every 1-2 hours when you are awake. Take your time and take a few normal breaths between deep breaths. 9. The spirometer may include an indicator to show your best effort. Use the indicator as a goal to work toward during each repetition. 10. After each set of 10 deep breaths, practice coughing to be sure your lungs are clear. If you have an incision (the cut made at the time of surgery), support your incision when coughing by placing a pillow or rolled up towels firmly against it. Once you are able to get out of bed, walk around indoors and cough well. You may stop using the incentive spirometer when instructed by your caregiver.  RISKS AND COMPLICATIONS  Take your time so you do not get dizzy or light-headed.  If you are in pain, you may need to take or ask for pain medication before doing incentive spirometry. It is harder to take a deep breath if you are having pain. AFTER USE  Rest and breathe slowly and easily.  It can be helpful to keep track of a log of your progress. Your caregiver can provide you with a simple table to help with this. If you are using the spirometer at home, follow these instructions: [place]  IF:   You are having difficultly using the spirometer.  You have trouble using the spirometer as often as instructed.  Your pain medication is not giving enough relief while using the spirometer.  You develop fever of 100.5 F (38.1 C) or higher. SEEK IMMEDIATE MEDICAL CARE IF:   You cough up bloody sputum that had not been present before.  You develop fever of 102 F (38.9 C) or greater.  You develop worsening pain at or near the incision site. MAKE SURE YOU:   Understand these instructions.  Will watch your condition.  Will get help right away if you are not doing well or get worse. Document Released: 07/30/2006 Document Revised: 06/11/2011 Document Reviewed: 09/30/2006 [facility] Patient Information 2014 [place], [place].   ________________________________________________________________________

## 2018-04-28 ENCOUNTER — Encounter (HOSPITAL_COMMUNITY)
Admission: RE | Admit: 2018-04-28 | Discharge: 2018-04-28 | Disposition: A | Discharge status: Home / Self Care | Payer: PRIVATE HEALTH INSURANCE | Source: Ambulatory Visit | Attending: General Surgery | Admitting: General Surgery

## 2018-04-28 ENCOUNTER — Other Ambulatory Visit: Payer: Self-pay

## 2018-04-28 ENCOUNTER — Encounter (HOSPITAL_COMMUNITY): Payer: Self-pay

## 2018-04-28 DIAGNOSIS — Z01812 Encounter for preprocedural laboratory examination: Secondary | ICD-10-CM | POA: Insufficient documentation

## 2018-04-28 DIAGNOSIS — C189 Malignant neoplasm of colon, unspecified: Secondary | ICD-10-CM | POA: Insufficient documentation

## 2018-04-28 HISTORY — DX: Bronchitis, not specified as acute or chronic: J40

## 2018-04-28 HISTORY — DX: Malignant (primary) neoplasm, unspecified: C80.1

## 2018-04-28 LAB — CBC
HCT: 44.1 % (ref 39.0–52.0)
Hemoglobin: 14.4 g/dL (ref 13.0–17.0)
MCH: 30.1 pg (ref 26.0–34.0)
MCHC: 32.7 g/dL (ref 30.0–36.0)
MCV: 92.1 fL (ref 80.0–100.0)
PLATELETS: 213 10*3/uL (ref 150–400)
RBC: 4.79 MIL/uL (ref 4.22–5.81)
RDW: 12.3 % (ref 11.5–15.5)
WBC: 5.9 10*3/uL (ref 4.0–10.5)
nRBC: 0 % (ref 0.0–0.2)

## 2018-04-28 LAB — BASIC METABOLIC PANEL
Anion gap: 6 (ref 5–15)
BUN: 15 mg/dL (ref 6–20)
CO2: 27 mmol/L (ref 22–32)
Calcium: 9.5 mg/dL (ref 8.9–10.3)
Chloride: 106 mmol/L (ref 98–111)
Creatinine, Ser: 0.96 mg/dL (ref 0.61–1.24)
GFR calc non Af Amer: >60 mL/min (ref >60)
Glucose, Bld: 95 mg/dL (ref 70–99)
Potassium: 4.4 mmol/L (ref 3.5–5.1)
Sodium: 139 mmol/L (ref 135–145)

## 2018-04-28 NOTE — Progress Notes (Signed)
   04/28/18 0834  OBSTRUCTIVE SLEEP APNEA  Have you ever been diagnosed with sleep apnea through a sleep study? No  Do you snore loudly (loud enough to be heard through closed doors)?  1  Do you often feel tired, fatigued, or sleepy during the daytime (such as falling asleep during driving or talking to someone)? 1  Has anyone observed you stop breathing during your sleep? 1  Do you have, or are you being treated for high blood pressure? 0  BMI more than 35 kg/m2? 0  Age > 50 (1-yes) 0  Neck circumference greater than:Male 16 inches or larger, Male 17inches or larger? 1  Male Gender (Yes=1) 1  Obstructive Sleep Apnea Score 5  Score 5 or greater  Results sent to PCP

## 2018-04-30 MED ORDER — BUPIVACAINE LIPOSOME 1.3 % IJ SUSP
20.0000 mL | INTRAMUSCULAR | Status: DC
  Filled 2018-04-30: qty 20

## 2018-05-01 ENCOUNTER — Inpatient Hospital Stay (HOSPITAL_COMMUNITY)
Admission: RE | Admit: 2018-05-01 | Discharge: 2018-05-05 | DRG: 331 | Disposition: A | Discharge status: Home / Self Care | Payer: PRIVATE HEALTH INSURANCE | Attending: General Surgery | Admitting: General Surgery

## 2018-05-01 ENCOUNTER — Encounter (HOSPITAL_COMMUNITY): Payer: Self-pay | Admitting: *Deleted

## 2018-05-01 ENCOUNTER — Encounter (HOSPITAL_COMMUNITY)
Admission: RE | Disposition: A | Discharge status: Home / Self Care | Payer: Self-pay | Source: Home / Self Care | Attending: General Surgery

## 2018-05-01 ENCOUNTER — Other Ambulatory Visit: Payer: Self-pay

## 2018-05-01 ENCOUNTER — Inpatient Hospital Stay (HOSPITAL_COMMUNITY): Payer: PRIVATE HEALTH INSURANCE | Admitting: Anesthesiology

## 2018-05-01 ENCOUNTER — Inpatient Hospital Stay (HOSPITAL_COMMUNITY): Payer: PRIVATE HEALTH INSURANCE | Admitting: Physician Assistant

## 2018-05-01 DIAGNOSIS — R110 Nausea: Secondary | ICD-10-CM | POA: Diagnosis not present

## 2018-05-01 DIAGNOSIS — C182 Malignant neoplasm of ascending colon: Principal | ICD-10-CM | POA: Diagnosis present

## 2018-05-01 DIAGNOSIS — M62838 Other muscle spasm: Secondary | ICD-10-CM | POA: Diagnosis not present

## 2018-05-01 DIAGNOSIS — C189 Malignant neoplasm of colon, unspecified: Secondary | ICD-10-CM | POA: Diagnosis present

## 2018-05-01 DIAGNOSIS — Z85038 Personal history of other malignant neoplasm of large intestine: Secondary | ICD-10-CM | POA: Diagnosis present

## 2018-05-01 DIAGNOSIS — Z8042 Family history of malignant neoplasm of prostate: Secondary | ICD-10-CM

## 2018-05-01 HISTORY — PX: LAPAROSCOPIC PARTIAL COLECTOMY: SHX5907

## 2018-05-01 SURGERY — LAPAROSCOPIC PARTIAL COLECTOMY
Anesthesia: General | Site: Abdomen

## 2018-05-01 MED ORDER — GABAPENTIN 300 MG PO CAPS
300.0000 mg | ORAL_CAPSULE | Freq: Two times a day (BID) | ORAL | Status: DC
  Administered 2018-05-02 – 2018-05-05 (×7): 300 mg via ORAL
  Filled 2018-05-01 (×8): qty 1

## 2018-05-01 MED ORDER — DEXAMETHASONE SODIUM PHOSPHATE 10 MG/ML IJ SOLN
INTRAMUSCULAR | Status: DC | PRN
  Administered 2018-05-01: 10 mg via INTRAVENOUS

## 2018-05-01 MED ORDER — PROMETHAZINE HCL 25 MG/ML IJ SOLN: 6.2500 mg | INTRAMUSCULAR | Status: DC | PRN

## 2018-05-01 MED ORDER — SODIUM CHLORIDE 0.9 % IR SOLN
Status: DC | PRN
  Administered 2018-05-01: 2000 mL

## 2018-05-01 MED ORDER — TRAMADOL HCL 50 MG PO TABS
50.0000 mg | ORAL_TABLET | Freq: Four times a day (QID) | ORAL | Status: DC | PRN
  Administered 2018-05-01 – 2018-05-05 (×9): 50 mg via ORAL
  Filled 2018-05-01 (×9): qty 1

## 2018-05-01 MED ORDER — MIDAZOLAM HCL 5 MG/5ML IJ SOLN
INTRAMUSCULAR | Status: DC | PRN
  Administered 2018-05-01: 2 mg via INTRAVENOUS

## 2018-05-01 MED ORDER — FENTANYL CITRATE (PF) 100 MCG/2ML IJ SOLN
INTRAMUSCULAR | Status: AC
  Filled 2018-05-01: qty 2

## 2018-05-01 MED ORDER — KETOROLAC TROMETHAMINE 30 MG/ML IJ SOLN
30.0000 mg | Freq: Once | INTRAMUSCULAR | Status: AC | PRN
  Administered 2018-05-01: 30 mg via INTRAVENOUS

## 2018-05-01 MED ORDER — KETAMINE HCL 10 MG/ML IJ SOLN
INTRAMUSCULAR | Status: DC | PRN
  Administered 2018-05-01: 40 mg via INTRAVENOUS

## 2018-05-01 MED ORDER — KETOROLAC TROMETHAMINE 30 MG/ML IJ SOLN
INTRAMUSCULAR | Status: AC
  Administered 2018-05-01: 30 mg via INTRAVENOUS
  Filled 2018-05-01: qty 1

## 2018-05-01 MED ORDER — FENTANYL CITRATE (PF) 100 MCG/2ML IJ SOLN
INTRAMUSCULAR | Status: DC | PRN
  Administered 2018-05-01: 100 ug via INTRAVENOUS
  Administered 2018-05-01 (×4): 25 ug via INTRAVENOUS
  Administered 2018-05-01: 50 ug via INTRAVENOUS

## 2018-05-01 MED ORDER — PROMETHAZINE HCL 25 MG/ML IJ SOLN
25.0000 mg | Freq: Four times a day (QID) | INTRAMUSCULAR | Status: DC | PRN
  Administered 2018-05-01 – 2018-05-04 (×3): 25 mg via INTRAVENOUS
  Filled 2018-05-01 (×5): qty 1

## 2018-05-01 MED ORDER — EPHEDRINE SULFATE 50 MG/ML IJ SOLN
INTRAMUSCULAR | Status: DC | PRN
  Administered 2018-05-01 (×2): 5 mg via INTRAVENOUS

## 2018-05-01 MED ORDER — ONDANSETRON HCL 4 MG/2ML IJ SOLN
INTRAMUSCULAR | Status: DC | PRN
  Administered 2018-05-01: 4 mg via INTRAVENOUS

## 2018-05-01 MED ORDER — ONDANSETRON HCL 4 MG PO TABS: 4.0000 mg | ORAL_TABLET | Freq: Four times a day (QID) | ORAL | Status: DC | PRN

## 2018-05-01 MED ORDER — ACETAMINOPHEN 500 MG PO TABS
1000.0000 mg | ORAL_TABLET | Freq: Four times a day (QID) | ORAL | Status: DC
  Administered 2018-05-01 – 2018-05-04 (×12): 1000 mg via ORAL
  Filled 2018-05-01 (×14): qty 2

## 2018-05-01 MED ORDER — ROCURONIUM BROMIDE 100 MG/10ML IV SOLN
INTRAVENOUS | Status: AC
  Filled 2018-05-01: qty 1

## 2018-05-01 MED ORDER — LACTATED RINGERS IR SOLN
Status: DC | PRN
  Administered 2018-05-01: 1000 mL

## 2018-05-01 MED ORDER — ALVIMOPAN 12 MG PO CAPS
12.0000 mg | ORAL_CAPSULE | ORAL | Status: AC
  Administered 2018-05-01: 12 mg via ORAL
  Filled 2018-05-01: qty 1

## 2018-05-01 MED ORDER — ENSURE SURGERY PO LIQD
237.0000 mL | Freq: Two times a day (BID) | ORAL | Status: DC
  Administered 2018-05-02 – 2018-05-05 (×6): 237 mL via ORAL
  Filled 2018-05-01 (×9): qty 237

## 2018-05-01 MED ORDER — ALVIMOPAN 12 MG PO CAPS
12.0000 mg | ORAL_CAPSULE | Freq: Two times a day (BID) | ORAL | Status: DC
  Administered 2018-05-02: 12 mg via ORAL
  Filled 2018-05-01: qty 1

## 2018-05-01 MED ORDER — KCL IN DEXTROSE-NACL 20-5-0.45 MEQ/L-%-% IV SOLN
INTRAVENOUS | Status: DC
  Administered 2018-05-01 – 2018-05-02 (×2): via INTRAVENOUS
  Filled 2018-05-01 (×3): qty 1000

## 2018-05-01 MED ORDER — PROPOFOL 10 MG/ML IV BOLUS
INTRAVENOUS | Status: AC
  Filled 2018-05-01: qty 20

## 2018-05-01 MED ORDER — PROPOFOL 10 MG/ML IV BOLUS
INTRAVENOUS | Status: DC | PRN
  Administered 2018-05-01: 190 mg via INTRAVENOUS

## 2018-05-01 MED ORDER — BUPIVACAINE-EPINEPHRINE (PF) 0.25% -1:200000 IJ SOLN
INTRAMUSCULAR | Status: AC
  Filled 2018-05-01: qty 30

## 2018-05-01 MED ORDER — SODIUM CHLORIDE 0.9 % IV SOLN
2.0000 g | Freq: Two times a day (BID) | INTRAVENOUS | Status: AC
  Administered 2018-05-01: 2 g via INTRAVENOUS
  Filled 2018-05-01: qty 2

## 2018-05-01 MED ORDER — LACTATED RINGERS IV SOLN
INTRAVENOUS | Status: DC
  Administered 2018-05-01 (×2): via INTRAVENOUS

## 2018-05-01 MED ORDER — FENTANYL CITRATE (PF) 100 MCG/2ML IJ SOLN
25.0000 ug | INTRAMUSCULAR | Status: DC | PRN
  Administered 2018-05-01 (×3): 50 ug via INTRAVENOUS

## 2018-05-01 MED ORDER — ONDANSETRON HCL 4 MG/2ML IJ SOLN
INTRAMUSCULAR | Status: AC
  Filled 2018-05-01: qty 2

## 2018-05-01 MED ORDER — BUPIVACAINE LIPOSOME 1.3 % IJ SUSP
INTRAMUSCULAR | Status: DC | PRN
  Administered 2018-05-01: 20 mL

## 2018-05-01 MED ORDER — SUGAMMADEX SODIUM 200 MG/2ML IV SOLN
INTRAVENOUS | Status: DC | PRN
  Administered 2018-05-01: 200 mg via INTRAVENOUS

## 2018-05-01 MED ORDER — LIDOCAINE 20MG/ML (2%) 15 ML SYRINGE OPTIME
INTRAMUSCULAR | Status: DC | PRN
  Administered 2018-05-01: 1.5 mg/kg/h via INTRAVENOUS

## 2018-05-01 MED ORDER — SODIUM CHLORIDE 0.9 % IV SOLN
2.0000 g | INTRAVENOUS | Status: AC
  Administered 2018-05-01: 2 g via INTRAVENOUS
  Filled 2018-05-01: qty 2

## 2018-05-01 MED ORDER — ONDANSETRON HCL 4 MG/2ML IJ SOLN
4.0000 mg | Freq: Four times a day (QID) | INTRAMUSCULAR | Status: DC | PRN
  Administered 2018-05-01 (×2): 4 mg via INTRAVENOUS
  Filled 2018-05-01 (×2): qty 2

## 2018-05-01 MED ORDER — LIDOCAINE HCL 2 % IJ SOLN
INTRAMUSCULAR | Status: AC
  Filled 2018-05-01: qty 20

## 2018-05-01 MED ORDER — FENTANYL CITRATE (PF) 100 MCG/2ML IJ SOLN
INTRAMUSCULAR | Status: AC
  Administered 2018-05-01: 50 ug via INTRAVENOUS
  Filled 2018-05-01: qty 2

## 2018-05-01 MED ORDER — ROCURONIUM BROMIDE 100 MG/10ML IV SOLN
INTRAVENOUS | Status: DC | PRN
  Administered 2018-05-01: 70 mg via INTRAVENOUS
  Administered 2018-05-01: 20 mg via INTRAVENOUS

## 2018-05-01 MED ORDER — ACETAMINOPHEN 500 MG PO TABS
1000.0000 mg | ORAL_TABLET | ORAL | Status: AC
  Administered 2018-05-01: 1000 mg via ORAL
  Filled 2018-05-01: qty 2

## 2018-05-01 MED ORDER — SACCHAROMYCES BOULARDII 250 MG PO CAPS
250.0000 mg | ORAL_CAPSULE | Freq: Two times a day (BID) | ORAL | Status: DC
  Administered 2018-05-02 – 2018-05-05 (×7): 250 mg via ORAL
  Filled 2018-05-01 (×8): qty 1

## 2018-05-01 MED ORDER — GABAPENTIN 300 MG PO CAPS
300.0000 mg | ORAL_CAPSULE | ORAL | Status: AC
  Administered 2018-05-01: 300 mg via ORAL
  Filled 2018-05-01: qty 1

## 2018-05-01 MED ORDER — DIPHENHYDRAMINE HCL 25 MG PO CAPS: 25.0000 mg | ORAL_CAPSULE | Freq: Four times a day (QID) | ORAL | Status: DC | PRN

## 2018-05-01 MED ORDER — MIDAZOLAM HCL 2 MG/2ML IJ SOLN
INTRAMUSCULAR | Status: AC
  Filled 2018-05-01: qty 2

## 2018-05-01 MED ORDER — DEXAMETHASONE SODIUM PHOSPHATE 10 MG/ML IJ SOLN
INTRAMUSCULAR | Status: AC
  Filled 2018-05-01: qty 1

## 2018-05-01 MED ORDER — BUPIVACAINE-EPINEPHRINE 0.25% -1:200000 IJ SOLN
INTRAMUSCULAR | Status: DC | PRN
  Administered 2018-05-01: 30 mL

## 2018-05-01 MED ORDER — HYDROMORPHONE HCL 1 MG/ML IJ SOLN
0.5000 mg | INTRAMUSCULAR | Status: DC | PRN
  Administered 2018-05-01 – 2018-05-03 (×5): 0.5 mg via INTRAVENOUS
  Filled 2018-05-01 (×6): qty 0.5

## 2018-05-01 MED ORDER — DIPHENHYDRAMINE HCL 50 MG/ML IJ SOLN: 25.0000 mg | Freq: Four times a day (QID) | INTRAMUSCULAR | Status: DC | PRN

## 2018-05-01 MED ORDER — EPHEDRINE 5 MG/ML INJ
INTRAVENOUS | Status: AC
  Filled 2018-05-01: qty 10

## 2018-05-01 MED ORDER — ENOXAPARIN SODIUM 40 MG/0.4ML ~~LOC~~ SOLN
40.0000 mg | SUBCUTANEOUS | Status: DC
  Administered 2018-05-02 – 2018-05-05 (×4): 40 mg via SUBCUTANEOUS
  Filled 2018-05-01 (×4): qty 0.4

## 2018-05-01 MED ORDER — KETAMINE HCL 10 MG/ML IJ SOLN
INTRAMUSCULAR | Status: AC
  Filled 2018-05-01: qty 1

## 2018-05-01 MED ORDER — ALUM & MAG HYDROXIDE-SIMETH 200-200-20 MG/5ML PO SUSP: 30.0000 mL | Freq: Four times a day (QID) | ORAL | Status: DC | PRN

## 2018-05-01 MED ORDER — FENTANYL CITRATE (PF) 250 MCG/5ML IJ SOLN
INTRAMUSCULAR | Status: AC
  Filled 2018-05-01: qty 5

## 2018-05-01 SURGICAL SUPPLY — 71 items
ADH SKN CLS APL DERMABOND .7 (GAUZE/BANDAGES/DRESSINGS) ×1
APPLIER CLIP 5 13 M/L LIGAMAX5 (MISCELLANEOUS)
APR CLP MED LRG 5 ANG JAW (MISCELLANEOUS)
BLADE EXTENDED COATED 6.5IN (ELECTRODE) IMPLANT
CABLE HIGH FREQUENCY MONO STRZ (ELECTRODE) IMPLANT
CELLS DAT CNTRL 66122 CELL SVR (MISCELLANEOUS) IMPLANT
CHLORAPREP W/TINT 26ML (MISCELLANEOUS) ×3 IMPLANT
CLIP APPLIE 5 13 M/L LIGAMAX5 (MISCELLANEOUS) IMPLANT
COVER WAND RF STERILE (DRAPES) ×3 IMPLANT
DECANTER SPIKE VIAL GLASS SM (MISCELLANEOUS) ×1 IMPLANT
DERMABOND ADVANCED (GAUZE/BANDAGES/DRESSINGS) ×2
DERMABOND ADVANCED .7 DNX12 (GAUZE/BANDAGES/DRESSINGS) ×1 IMPLANT
DRAIN CHANNEL 19F RND (DRAIN) IMPLANT
DRAPE LAPAROSCOPIC ABDOMINAL (DRAPES) ×3 IMPLANT
DRAPE SURG IRRIG POUCH 19X23 (DRAPES) ×3 IMPLANT
DRSG OPSITE POSTOP 4X10 (GAUZE/BANDAGES/DRESSINGS) IMPLANT
DRSG OPSITE POSTOP 4X6 (GAUZE/BANDAGES/DRESSINGS) ×2 IMPLANT
DRSG OPSITE POSTOP 4X8 (GAUZE/BANDAGES/DRESSINGS) IMPLANT
ELECT PENCIL ROCKER SW 15FT (MISCELLANEOUS) ×6 IMPLANT
ELECT REM PT RETURN 15FT ADLT (MISCELLANEOUS) ×3 IMPLANT
EVACUATOR SILICONE 100CC (DRAIN) IMPLANT
GAUZE SPONGE 4X4 12PLY STRL (GAUZE/BANDAGES/DRESSINGS) IMPLANT
GLOVE BIO SURGEON STRL SZ 6.5 (GLOVE) ×4 IMPLANT
GLOVE BIO SURGEONS STRL SZ 6.5 (GLOVE) ×2
GLOVE BIOGEL PI IND STRL 7.0 (GLOVE) ×2 IMPLANT
GLOVE BIOGEL PI INDICATOR 7.0 (GLOVE) ×4
GOWN STRL REUS W/TWL 2XL LVL3 (GOWN DISPOSABLE) ×6 IMPLANT
GOWN STRL REUS W/TWL XL LVL3 (GOWN DISPOSABLE) ×12 IMPLANT
GRASPER ENDOPATH ANVIL 10MM (MISCELLANEOUS) IMPLANT
HOLDER FOLEY CATH W/STRAP (MISCELLANEOUS) ×1 IMPLANT
IRRIG SUCT STRYKERFLOW 2 WTIP (MISCELLANEOUS) ×3
IRRIGATION SUCT STRKRFLW 2 WTP (MISCELLANEOUS) ×1 IMPLANT
PACK COLON (CUSTOM PROCEDURE TRAY) ×3 IMPLANT
PAD POSITIONING PINK XL (MISCELLANEOUS) ×3 IMPLANT
PORT LAP GEL ALEXIS MED 5-9CM (MISCELLANEOUS) ×3 IMPLANT
PROTECTOR NERVE ULNAR (MISCELLANEOUS) ×1 IMPLANT
RELOAD PROXIMATE 75MM BLUE (ENDOMECHANICALS) ×6 IMPLANT
RELOAD STAPLE 75 3.8 BLU REG (ENDOMECHANICALS) IMPLANT
RETRACTOR WND ALEXIS 18 MED (MISCELLANEOUS) IMPLANT
RTRCTR WOUND ALEXIS 18CM MED (MISCELLANEOUS)
SCISSORS LAP 5X35 DISP (ENDOMECHANICALS) ×3 IMPLANT
SEALER TISSUE G2 STRG ARTC 35C (ENDOMECHANICALS) ×3 IMPLANT
SET TUBE SMOKE EVAC HIGH FLOW (TUBING) ×3 IMPLANT
SLEEVE XCEL OPT CAN 5 100 (ENDOMECHANICALS) ×5 IMPLANT
SPONGE DRAIN TRACH 4X4 STRL 2S (GAUZE/BANDAGES/DRESSINGS) IMPLANT
SPONGE LAP 18X18 RF (DISPOSABLE) IMPLANT
STAPLER GUN LINEAR PROX 60 (STAPLE) ×2 IMPLANT
STAPLER PROXIMATE 75MM BLUE (STAPLE) ×2 IMPLANT
STAPLER VISISTAT 35W (STAPLE) IMPLANT
SURGILUBE 2OZ TUBE FLIPTOP (MISCELLANEOUS) ×1 IMPLANT
SUT ETHILON 2 0 PS N (SUTURE) IMPLANT
SUT NOVA NAB GS-21 0 18 T12 DT (SUTURE) ×6 IMPLANT
SUT PDS AB 1 CTX 36 (SUTURE) IMPLANT
SUT PDS AB 1 TP1 96 (SUTURE) IMPLANT
SUT PROLENE 2 0 KS (SUTURE) IMPLANT
SUT SILK 2 0 (SUTURE)
SUT SILK 2 0 SH CR/8 (SUTURE) ×2 IMPLANT
SUT SILK 2-0 18XBRD TIE 12 (SUTURE) IMPLANT
SUT SILK 3 0 (SUTURE)
SUT SILK 3 0 SH CR/8 (SUTURE) ×3 IMPLANT
SUT SILK 3-0 18XBRD TIE 12 (SUTURE) ×1 IMPLANT
SUT VIC AB 2-0 SH 18 (SUTURE) ×3 IMPLANT
SUT VIC AB 4-0 PS2 27 (SUTURE) ×3 IMPLANT
SYS LAPSCP GELPORT 120MM (MISCELLANEOUS)
SYSTEM LAPSCP GELPORT 120MM (MISCELLANEOUS) IMPLANT
TOWEL OR NON WOVEN STRL DISP B (DISPOSABLE) ×3 IMPLANT
TRAY FOLEY MTR SLVR 16FR STAT (SET/KITS/TRAYS/PACK) IMPLANT
TROCAR BLADELESS OPT 5 100 (ENDOMECHANICALS) ×3 IMPLANT
TROCAR XCEL BLUNT TIP 100MML (ENDOMECHANICALS) IMPLANT
TUBING CONNECTING 10 (TUBING) ×2 IMPLANT
TUBING CONNECTING 10' (TUBING) ×1

## 2018-05-01 NOTE — Op Note (Signed)
05/01/2018  9:03 AM  PATIENT:  Aaron Sharp  53 y.o. male  Patient Care Team: [name], [name], MD as PCP - General  PRE-OPERATIVE DIAGNOSIS:  COLON CANCER  POST-OPERATIVE DIAGNOSIS:  Colon Cancer  PROCEDURE:  LAPAROSCOPIC PARTIAL COLECTOMY    Surgeon(s): Leighton Ruff, MD  ASSISTANT: none   ANESTHESIA:   general  EBL:  50 ml  SPECIMEN:  Source of Specimen:  R colon  DISPOSITION OF SPECIMEN:  PATHOLOGY  COUNTS:  YES  PLAN OF CARE: Admit to inpatient   PATIENT DISPOSITION:  PACU - hemodynamically stable.   INDICATIONS: This is a 53 y.o. male who presented to my office with a right colon cancer found in a polyp resected at the hepatic flexure. The risk and benefits and alternative treatments were explained to the patient prior to the OR and the patient has elected to proceed with lap R colectomy.  Consent was signed and placed on chart prior to the OR.   OR FINDINGS: tattoo noted at the hepatic flexure  DESCRIPTION:  The patient was identified & brought into the operating room. The patient was positioned supine with arms tucked. SCDs were active during the entire case. The patient underwent general anesthesia without any difficulty. A foley catheter was inserted under sterile conditions. The abdomen was prepped and draped in a sterile fashion. A Surgical Timeout confirmed our plan.  I made an incision around the umbilical fold. I dissected down through the subcutaneous tissues using cautery.  The fascia was divided with cautery.  Blunt dissection was used to obtain peritoneal entry.  An alexis wound protector was placed and the cap was placed over this.  The abdomen was insufflated to ~15 mmHg.  Camera inspection revealed no injury.  I placed additional ports under direct laparoscopic visualization.  I evaluated the entire abdomen laparoscopically.  The liver appeared normal, the large and small bowel were normal as well.  There were no signs of metastatic disease.   I  began by identifying the ileocolic artery and vein within the mesentery. Dissection was bluntly carried around these structures. The duodenum was identified and free from the structures. I then separated the structures bluntly and used the Enseal device to transect these separately.  I developed the retroperitoneal plane bluntly.  I then freed the appendix off its attachments to the pelvic wall. I mobilized the terminal ileum.  I took care to avoid injuring any retroperitoneal structures.  After this I began to mobilize laterally down the white line of [place] and then took down the hepatic flexure using the Enseal device. I mobilized the omentum off of the right transverse colon. The entire colon was then flipped medially and mobilized off of the retroperitoneal structures until I could visualize the lateral edge of the duodenum underneath.  I gently freed the duodenal attachments.  At that point, I desufflated the abdomen and removed the wound protector cap.  The terminal ileum and right colon were then removed from the wound. The terminal ileum was transected using a GIA blue load stapler. The remaining mesentery was divided using the Enseal device. I identified a portion of the transverse colon just distal to the hepatic flexure. This was transected using another blue load GIA stapler.  An anastomosis was created between the terminal ileum and the transverse colon. This was done using a GIA blue load stapler.  The common enterotomy channel was closed using a TA 60 blue load stapler. Hemostasis was good at the staple line. One 3-0 silk  suture was used to imbricate a bleeding vessel.  An anti-tension suture was placed in the crotch of the anastomosis. This was then placed back into the abdomen. The abdomen was then irrigated with normal saline. The omentum was then brought down over the anastomosis. The Alexis wound protector was removed, and we switched to clean instruments, gowns and drapes.  The fascia was then  closed using #1 Novafil interrupted sutures.  The subcutaneous tissue of the extraction incision was closed using interrupted 2-0 Vicryl sutures. The skin was then closed using 4-0 Vicryl sutures. Dermabond was placed on the port sites and a sterile dressing was placed over the abdominal incision. All counts were correct per operating room staff. The patient was then awakened from anesthesia and sent to the post anesthesia care unit in stable condition.

## 2018-05-01 NOTE — Anesthesia Preprocedure Evaluation (Addendum)
Anesthesia Evaluation  Patient identified by MRN, date of birth, ID band Patient awake    Reviewed: Allergy & Precautions, NPO status , Patient's Chart, lab work & pertinent test results  Airway Mallampati: II  TM Distance: >3 FB Neck ROM: Full    Dental no notable dental hx.    Pulmonary Recent URI ,    Pulmonary exam normal breath sounds clear to auscultation       Cardiovascular negative cardio ROS Normal cardiovascular exam Rhythm:Regular Rate:Normal     Neuro/Psych negative neurological ROS  negative psych ROS   GI/Hepatic Neg liver ROS, hiatal hernia,   Endo/Other  negative endocrine ROS  Renal/GU negative Renal ROS  negative genitourinary   Musculoskeletal negative musculoskeletal ROS (+)   Abdominal   Peds negative pediatric ROS (+)  Hematology negative hematology ROS (+)   Anesthesia Other Findings   Reproductive/Obstetrics negative OB ROS                             Anesthesia Physical Anesthesia Plan  ASA: II  Anesthesia Plan: General   Post-op Pain Management:    Induction: Intravenous  PONV Risk Score and Plan: 2 and Ondansetron, Dexamethasone and Treatment may vary due to age or medical condition  Airway Management Planned: Oral ETT  Additional Equipment:   Intra-op Plan:   Post-operative Plan: Extubation in OR  Informed Consent: I have reviewed the patients History and Physical, chart, labs and discussed the procedure including the risks, benefits and alternatives for the proposed anesthesia with the patient or authorized representative who has indicated his/her understanding and acceptance.     Dental advisory given  Plan Discussed with: CRNA and Surgeon  Anesthesia Plan Comments:         Anesthesia Quick Evaluation

## 2018-05-01 NOTE — Interval H&P Note (Signed)
History and Physical Interval Note:  05/01/2018 7:07 AM  Aaron Sharp  has presented today for surgery, with the diagnosis of COLON CANCER  The various methods of treatment have been discussed with the patient and family. After consideration of risks, benefits and other options for treatment, the patient has consented to  Procedure(s): LAPAROSCOPIC PARTIAL COLECTOMY (N/A) as a surgical intervention .  The patient's history has been reviewed, patient examined, no change in status, stable for surgery.  I have reviewed the patient's chart and labs.  Questions were answered to the patient's satisfaction.     Rosario Adie, MD  [facility]

## 2018-05-01 NOTE — Anesthesia Procedure Notes (Signed)
Procedure Name: Intubation Date/Time: 05/01/2018 7:32 AM Performed by: Glory Buff, CRNA Pre-anesthesia Checklist: Patient identified, Emergency Drugs available, Suction available and Patient being monitored Patient Re-evaluated:Patient Re-evaluated prior to induction Oxygen Delivery Method: Circle system utilized Preoxygenation: Pre-oxygenation with 100% oxygen Induction Type: IV induction Ventilation: Mask ventilation without difficulty Laryngoscope Size: Miller and 3 Grade View: Grade I Tube type: Oral Tube size: 7.5 mm Number of attempts: 1 Airway Equipment and Method: Stylet and Oral airway Placement Confirmation: ETT inserted through vocal cords under direct vision,  positive ETCO2 and breath sounds checked- equal and bilateral Secured at: 22 cm Tube secured with: Tape Dental Injury: Teeth and Oropharynx as per pre-operative assessment

## 2018-05-01 NOTE — Transfer of Care (Signed)
Immediate Anesthesia Transfer of Care Note  Patient: Aaron Sharp  Procedure(s) Performed: LAPAROSCOPIC PARTIAL COLECTOMY (N/A Abdomen)  Patient Location: PACU  Anesthesia Type:General  Level of Consciousness: awake, alert  and oriented  Airway & Oxygen Therapy: Patient Spontanous Breathing and Patient connected to face mask oxygen  Post-op Assessment: Report given to RN and Post -op Vital signs reviewed and stable  Post vital signs: Reviewed and stable  Last Vitals:  Vitals Value Taken Time  BP 131/89 05/01/2018  9:29 AM  Temp    Pulse 79 05/01/2018  9:30 AM  Resp 18 05/01/2018  9:30 AM  SpO2 100 % 05/01/2018  9:30 AM  Vitals shown include unvalidated device data.  Last Pain:  Vitals:   05/01/18 0556  TempSrc: Oral         Complications: No apparent anesthesia complications

## 2018-05-01 NOTE — Progress Notes (Signed)
ERAS education reinforced. Did patient attend class prior to procedure? Yes [ x ] No [   ] Discussed: Pain Control [ x  ] Mobility [ x  ] Diet [   ] Other [ x  ] Patient reports out of bed in chair and in hallways already on DOS. Resting at present time. IS to 2500. Pain is controlled with ice pack. Felt nausea earlier- antiemetic given. Questions answered and discussed ERAS protocol. Will follow. Pecolia Ades, RN, BSN Quality Program Coordinator, Enhanced Recovery after Surgery 05/01/18 4:11 PM

## 2018-05-02 ENCOUNTER — Encounter (HOSPITAL_COMMUNITY): Payer: Self-pay | Admitting: General Surgery

## 2018-05-02 LAB — BASIC METABOLIC PANEL
Anion gap: 10 (ref 5–15)
BUN: 16 mg/dL (ref 6–20)
CO2: 23 mmol/L (ref 22–32)
Calcium: 8.6 mg/dL — ABNORMAL LOW (ref 8.9–10.3)
Chloride: 104 mmol/L (ref 98–111)
Creatinine, Ser: 1.41 mg/dL — ABNORMAL HIGH (ref 0.61–1.24)
GFR calc Af Amer: >60 mL/min (ref >60)
GFR calc non Af Amer: 58 mL/min — ABNORMAL LOW (ref >60)
Glucose, Bld: 171 mg/dL — ABNORMAL HIGH (ref 70–99)
Potassium: 5.1 mmol/L (ref 3.5–5.1)
Sodium: 137 mmol/L (ref 135–145)

## 2018-05-02 LAB — CBC
HEMATOCRIT: 38.6 % — AB (ref 39.0–52.0)
Hemoglobin: 12.4 g/dL — ABNORMAL LOW (ref 13.0–17.0)
MCH: 30 pg (ref 26.0–34.0)
MCHC: 32.1 g/dL (ref 30.0–36.0)
MCV: 93.5 fL (ref 80.0–100.0)
Platelets: 263 10*3/uL (ref 150–400)
RBC: 4.13 MIL/uL — ABNORMAL LOW (ref 4.22–5.81)
RDW: 12.8 % (ref 11.5–15.5)
WBC: 14.7 10*3/uL — AB (ref 4.0–10.5)
nRBC: 0 % (ref 0.0–0.2)

## 2018-05-02 MED ORDER — METHOCARBAMOL 1000 MG/10ML IJ SOLN
500.0000 mg | Freq: Three times a day (TID) | INTRAVENOUS | Status: DC
  Administered 2018-05-02 – 2018-05-04 (×8): 500 mg via INTRAVENOUS
  Filled 2018-05-02 (×2): qty 5
  Filled 2018-05-02 (×4): qty 500
  Filled 2018-05-02: qty 5
  Filled 2018-05-02: qty 500
  Filled 2018-05-02: qty 5
  Filled 2018-05-02 (×3): qty 500

## 2018-05-02 MED ORDER — SODIUM CHLORIDE 0.9 % IV SOLN
INTRAVENOUS | Status: DC
  Administered 2018-05-02 – 2018-05-04 (×3): via INTRAVENOUS

## 2018-05-02 NOTE — Plan of Care (Signed)
  Problem: Activity: Goal: Risk for activity intolerance will decrease Outcome: Progressing   Problem: Nutrition: Goal: Adequate nutrition will be maintained Outcome: Progressing   Problem: Clinical Measurements: Goal: Will remain free from infection Outcome: Progressing

## 2018-05-02 NOTE — Discharge Instructions (Signed)

## 2018-05-02 NOTE — Progress Notes (Signed)
ERAS education reinforced. Did patient attend class prior to procedure? Yes [ x  ] No [   ] Discussed: Pain Control [   ] Mobility [  x ] Diet [ x  ] Other [ x  ]-- nausea  Patient reports a "rough" night. Dry heaving much of the night. Wife at bedside and encouraging patient to take more medicine for nausea to stay ahead of it. He has not been taking in fluids today. Has not voided since 0300 this morning. He will report this to his RN. IVF at 75cc/hr. When I left the room he was attempting to take in clears. Will follow. Pecolia Ades, RN, BSN Quality Program Coordinator, Enhanced Recovery after Surgery 05/02/18 1:07 PM

## 2018-05-02 NOTE — Progress Notes (Signed)
1 Day Post-Op lap R colectomy Subjective: Having some muscle spasm pain.  Was nauseated last night but better this am.  Has urinated and had a BM  Objective: Vital signs in last 24 hours: Temp:  [97.4 F (36.3 C)-98.4 F (36.9 C)] 97.5 F (36.4 C) (01/31 0550) Pulse Rate:  [62-84] 84 (01/31 0550) Resp:  [12-18] 17 (01/31 0550) BP: (98-136)/(65-89) 112/68 (01/31 0550) SpO2:  [93 %-100 %] 99 % (01/31 0550) Weight:  [93.2 kg] 93.2 kg (01/31 0550)   Intake/Output from previous day: 01/30 0701 - 01/31 0700 In: 2528 [P.O.:320; I.V.:2108; IV Piggyback:100] Out: 6433 [Urine:1550; Blood:20] Intake/Output this shift: No intake/output data recorded.   General appearance: alert and cooperative GI: normal findings: soft, mildly distended  Incision: no significant drainage  Lab Results:  Recent Labs    05/02/18 0446  WBC 14.7*  HGB 12.4*  HCT 38.6*  PLT 263   BMET Recent Labs    05/02/18 0446  NA 137  K 5.1  CL 104  CO2 23  GLUCOSE 171*  BUN 16  CREATININE 1.41*  CALCIUM 8.6*   PT/INR No results for input(s): LABPROT, INR in the last 72 hours. ABG No results for input(s): PHART, HCO3 in the last 72 hours.  Invalid input(s): PCO2, PO2  MEDS, Scheduled . acetaminophen  1,000 mg Oral Q6H  . alvimopan  12 mg Oral BID  . enoxaparin (LOVENOX) injection  40 mg Subcutaneous Q24H  . feeding supplement  237 mL Oral BID BM  . gabapentin  300 mg Oral BID  . saccharomyces boulardii  250 mg Oral BID    Studies/Results: No results found.  Assessment: s/p Procedure(s): LAPAROSCOPIC PARTIAL COLECTOMY Patient Active Problem List   Diagnosis Date Noted  . Colon cancer ([place]) 05/01/2018  . Polyp of hepatic flexure of colon   . 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 Fatigue 01/08/2018  . Vertigo 06/05/2017  . Diarrhea, infectious, adult 01/28/2017  . Chest wall pain 08/01/2016  . Cervical radiculopathy 10/07/2015  . 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 Nonallopathic  lesion of cervical region 09/09/2013  . Nonallopathic lesion of thoracic region 09/09/2013  . Nonallopathic lesion of lumbosacral region 09/09/2013  . Abnormal findings on cytological and histological examination of urine 09/07/2013  . Lumbar radiculopathy 09/03/2013  . 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 Colon cancer screening 07/04/2012  . Neck mass 07/04/2012  . PSA, INCREASED 03/15/2010  . NEOPLASM, SKIN, UNCERTAIN BEHAVIOR 29/51/8841  . SHOULDER PAIN, RIGHT 07/22/2008  . FOOT PAIN, LEFT 07/22/2008  . SKIN RASH 07/22/2008    Expected post op course  Plan: Advance diet as tolerated Ambulate Scheduled robaxin for muscle spasms Elevated Cr: most likely due to bowel prep and Toradol given in PACU.  Increase IVF's until Cr normalizes   LOS: 1 day     .Rosario Adie, [facility], [place]   05/02/2018 7:55 AM

## 2018-05-03 LAB — BASIC METABOLIC PANEL
Anion gap: 6 (ref 5–15)
BUN: 18 mg/dL (ref 6–20)
CO2: 23 mmol/L (ref 22–32)
Calcium: 8.5 mg/dL — ABNORMAL LOW (ref 8.9–10.3)
Chloride: 109 mmol/L (ref 98–111)
Creatinine, Ser: 1.11 mg/dL (ref 0.61–1.24)
GFR calc Af Amer: >60 mL/min (ref >60)
GFR calc non Af Amer: >60 mL/min (ref >60)
Glucose, Bld: 97 mg/dL (ref 70–99)
POTASSIUM: 4.7 mmol/L (ref 3.5–5.1)
SODIUM: 138 mmol/L (ref 135–145)

## 2018-05-03 LAB — CBC
HCT: 28.2 % — ABNORMAL LOW (ref 39.0–52.0)
HEMOGLOBIN: 9.1 g/dL — AB (ref 13.0–17.0)
MCH: 30.5 pg (ref 26.0–34.0)
MCHC: 32.3 g/dL (ref 30.0–36.0)
MCV: 94.6 fL (ref 80.0–100.0)
Platelets: 168 10*3/uL (ref 150–400)
RBC: 2.98 MIL/uL — ABNORMAL LOW (ref 4.22–5.81)
RDW: 12.9 % (ref 11.5–15.5)
WBC: 10.2 10*3/uL (ref 4.0–10.5)
nRBC: 0 % (ref 0.0–0.2)

## 2018-05-03 NOTE — Progress Notes (Signed)
2 Days Post-Op   Subjective/Chief Complaint: Feeling better today. Less pain and nausea   Objective: Vital signs in last 24 hours: Temp:  [98 F (36.7 C)-98.5 F (36.9 C)] 98 F (36.7 C) (02/01 0541) Pulse Rate:  [82-100] 82 (02/01 0541) Resp:  [16] 16 (02/01 0541) BP: (119-136)/(64-87) 136/64 (02/01 0541) SpO2:  [97 %-100 %] 99 % (02/01 0541) Weight:  [90.4 kg] 90.4 kg (02/01 0541) Last BM Date: 05/01/18  Intake/Output from previous day: 01/31 0701 - 02/01 0700 In: 1666.6 [I.V.:1468.1; IV Piggyback:198.5] Out: 3025 [Urine:3025] Intake/Output this shift: Total I/O In: 360 [P.O.:360] Out: 700 [Urine:700]  General appearance: alert and cooperative Resp: clear to auscultation bilaterally Cardio: regular rate and rhythm GI: soft, mild tenderness. good bs. incisions look good  Lab Results:  Recent Labs    05/02/18 0446 05/03/18 0432  WBC 14.7* 10.2  HGB 12.4* 9.1*  HCT 38.6* 28.2*  PLT 263 168   BMET Recent Labs    05/02/18 0446 05/03/18 0432  NA 137 138  K 5.1 4.7  CL 104 109  CO2 23 23  GLUCOSE 171* 97  BUN 16 18  CREATININE 1.41* 1.11  CALCIUM 8.6* 8.5*   PT/INR No results for input(s): LABPROT, INR in the last 72 hours. ABG No results for input(s): PHART, HCO3 in the last 72 hours.  Invalid input(s): PCO2, PO2  Studies/Results: No results found.  Anti-infectives: Anti-infectives (From admission, onward)   Start     Dose/Rate Route Frequency Ordered Stop   05/01/18 2200  cefoTEtan (CEFOTAN) 2 g in sodium chloride 0.9 % 100 mL IVPB     2 g 200 mL/hr over 30 Minutes Intravenous Every 12 hours 05/01/18 1024 05/01/18 2147   05/01/18 0600  cefoTEtan (CEFOTAN) 2 g in sodium chloride 0.9 % 100 mL IVPB     2 g 200 mL/hr over 30 Minutes Intravenous On call to O.R. 05/01/18 0551 05/01/18 0803      Assessment/Plan: s/p Procedure(s): LAPAROSCOPIC PARTIAL COLECTOMY (N/A) Advance diet  Ambulate Pod 2 Cr improving  LOS: 2 days    Autumn Messing  III 05/03/2018

## 2018-05-03 NOTE — Progress Notes (Signed)
I have reviewed and concur with this student's documentation.   

## 2018-05-03 NOTE — Anesthesia Postprocedure Evaluation (Signed)
Anesthesia Post Note  Patient: Aaron Sharp  Procedure(s) Performed: LAPAROSCOPIC PARTIAL COLECTOMY (N/A Abdomen)     Patient location during evaluation: PACU Anesthesia Type: General Level of consciousness: awake and alert Pain management: pain level controlled Vital Signs Assessment: post-procedure vital signs reviewed and stable Respiratory status: spontaneous breathing, nonlabored ventilation, respiratory function stable and patient connected to nasal cannula oxygen Cardiovascular status: blood pressure returned to baseline and stable Postop Assessment: no apparent nausea or vomiting Anesthetic complications: no    Last Vitals:  Vitals:   05/02/18 2203 05/03/18 0541  BP: 123/87 136/64  Pulse: 100 82  Resp: 16 16  Temp: 36.9 C 36.7 C  SpO2: 99% 99%    Last Pain:  Vitals:   05/03/18 0541  TempSrc: Oral  PainSc:                  Donnice Nielsen S

## 2018-05-04 LAB — CBC
HCT: 29.5 % — ABNORMAL LOW (ref 39.0–52.0)
Hemoglobin: 9.5 g/dL — ABNORMAL LOW (ref 13.0–17.0)
MCH: 31.5 pg (ref 26.0–34.0)
MCHC: 32.2 g/dL (ref 30.0–36.0)
MCV: 97.7 fL (ref 80.0–100.0)
Platelets: 191 10*3/uL (ref 150–400)
RBC: 3.02 MIL/uL — ABNORMAL LOW (ref 4.22–5.81)
RDW: 12.9 % (ref 11.5–15.5)
WBC: 10.7 10*3/uL — ABNORMAL HIGH (ref 4.0–10.5)
nRBC: 0 % (ref 0.0–0.2)

## 2018-05-04 LAB — BASIC METABOLIC PANEL
Anion gap: 6 (ref 5–15)
BUN: 13 mg/dL (ref 6–20)
CO2: 27 mmol/L (ref 22–32)
Calcium: 8.9 mg/dL (ref 8.9–10.3)
Chloride: 106 mmol/L (ref 98–111)
Creatinine, Ser: 1.02 mg/dL (ref 0.61–1.24)
GFR calc Af Amer: >60 mL/min (ref >60)
GFR calc non Af Amer: >60 mL/min (ref >60)
Glucose, Bld: 99 mg/dL (ref 70–99)
Potassium: 3.6 mmol/L (ref 3.5–5.1)
Sodium: 139 mmol/L (ref 135–145)

## 2018-05-04 NOTE — Progress Notes (Signed)
3 Days Post-Op   Subjective/Chief Complaint: Still feels bloated and having some pain   Objective: Vital signs in last 24 hours: Temp:  [98.1 F (36.7 C)-98.5 F (36.9 C)] 98.5 F (36.9 C) (02/02 0625) Pulse Rate:  [74-96] 74 (02/02 0625) Resp:  [18-20] 20 (02/02 0625) BP: (109-123)/(61-68) 116/68 (02/02 0625) SpO2:  [95 %-98 %] 98 % (02/02 0625) Weight:  [89.8 kg] 89.8 kg (02/02 0625) Last BM Date: 05/03/18  Intake/Output from previous day: 02/01 0701 - 02/02 0700 In: 3527.8 [P.O.:1800; I.V.:1518.9; IV Piggyback:208.9] Out: 3250 [Urine:3250] Intake/Output this shift: No intake/output data recorded.  General appearance: alert and cooperative Resp: clear to auscultation bilaterally Cardio: regular rate and rhythm GI: soft, mild tenderness. good bs. incisions look good  Lab Results:  Recent Labs    05/03/18 0432 05/04/18 0355  WBC 10.2 10.7*  HGB 9.1* 9.5*  HCT 28.2* 29.5*  PLT 168 191   BMET Recent Labs    05/03/18 0432 05/04/18 0355  NA 138 139  K 4.7 3.6  CL 109 106  CO2 23 27  GLUCOSE 97 99  BUN 18 13  CREATININE 1.11 1.02  CALCIUM 8.5* 8.9   PT/INR No results for input(s): LABPROT, INR in the last 72 hours. ABG No results for input(s): PHART, HCO3 in the last 72 hours.  Invalid input(s): PCO2, PO2  Studies/Results: No results found.  Anti-infectives: Anti-infectives (From admission, onward)   Start     Dose/Rate Route Frequency Ordered Stop   05/01/18 2200  cefoTEtan (CEFOTAN) 2 g in sodium chloride 0.9 % 100 mL IVPB     2 g 200 mL/hr over 30 Minutes Intravenous Every 12 hours 05/01/18 1024 05/01/18 2147   05/01/18 0600  cefoTEtan (CEFOTAN) 2 g in sodium chloride 0.9 % 100 mL IVPB     2 g 200 mL/hr over 30 Minutes Intravenous On call to O.R. 05/01/18 2355 05/01/18 0803      Assessment/Plan: s/p Procedure(s): LAPAROSCOPIC PARTIAL COLECTOMY (N/A) Advance diet. Start soft today Ambulate Hopefully ready to go home tomorrow  LOS: 3 days     Autumn Messing III 05/04/2018

## 2018-05-05 ENCOUNTER — Telehealth: Payer: Self-pay | Admitting: *Deleted

## 2018-05-05 MED ORDER — TRAMADOL HCL 50 MG PO TABS: 50.0000 mg | ORAL_TABLET | Freq: Four times a day (QID) | ORAL | 0 refills | Status: DC | PRN

## 2018-05-05 NOTE — Telephone Encounter (Signed)
Pt was on TCM report admitted 05/01/18 for Colon Cancer. Pt had a laparoscopic right colectomy procedure done, and tolerated it well.

## 2018-05-05 NOTE — Telephone Encounter (Signed)
By postop day 3 was ambulating well and having good bowel function.  He was tolerating a diet and urinating well. Pt D/c 05/05/18, and will follow-up w/surgeon in 2 weeks.Marland KitchenJohny Chess

## 2018-05-05 NOTE — Discharge Summary (Signed)
Physician Discharge Summary  Patient ID: Aaron Sharp MRN: [id number] DOB/AGE: [birth year] 51 y.o.  Admit date: 05/01/2018 Discharge date: 05/05/2018  Admission Diagnoses: Colon Cancer Discharge Diagnoses:  Active Problems:   Colon cancer [facility])   Discharged Condition: good  Hospital Course: atient was admitted after surgery.  His diet was advanced as tolerated.  By postop day 3 was ambulating well and having good bowel function.  He was tolerating a diet and urinating well.  Pathology was reviewed with the patient prior to discharge. He will follow up in the office in 2 weeks.  Consults: None  Significant Diagnostic Studies: labs: CBC and bmet  Treatments: IV hydration, analgesia: tramadol and surgery: laparoscopic right colectomy  Discharge Exam: Blood pressure 113/68, pulse 73, temperature 98.4 F (36.9 C), temperature source Oral, resp. rate 18, height 5' 11.5" (1.816 m), weight 88.9 kg, SpO2 100 %. General appearance: alert and cooperative GI: normal findings: soft, non-tender Incision/Wound:clean dry and intact  Disposition: Discharge disposition: 01-Home or Self Care        Allergies as of 05/05/2018   No Known Allergies     Medication List    TAKE these medications   azithromycin 250 MG tablet Commonly known as:  ZITHROMAX Take 2 tablets by mouth at once today, then one tablet daily x 4 days.   ibuprofen 200 MG tablet Commonly known as:  ADVIL,MOTRIN Take 400 mg by mouth every 6 (six) hours as needed for moderate pain.   traMADol 50 MG tablet Commonly known as:  ULTRAM Take 1 tablet (50 mg total) by mouth every 6 (six) hours as needed (mild pain).      Follow-up Information    Leighton Ruff, MD. Schedule an appointment as soon as possible for a visit in 2 week(s).   Specialty:  General Surgery Contact information: [address] [place] [address] [phone]           Signed: Rosario Adie 05/05/7626, 9:03 AM

## 2018-05-20 ENCOUNTER — Ambulatory Visit (HOSPITAL_COMMUNITY): Admit: 2018-05-20 | Payer: PRIVATE HEALTH INSURANCE | Admitting: Gastroenterology

## 2018-05-20 ENCOUNTER — Encounter (HOSPITAL_COMMUNITY): Payer: Self-pay

## 2018-05-20 SURGERY — COLONOSCOPY WITH PROPOFOL
Anesthesia: Monitor Anesthesia Care

## 2018-05-21 ENCOUNTER — Other Ambulatory Visit: Payer: Self-pay | Admitting: Internal Medicine

## 2018-05-21 ENCOUNTER — Ambulatory Visit: Payer: PRIVATE HEALTH INSURANCE | Admitting: Internal Medicine

## 2018-05-21 ENCOUNTER — Encounter: Payer: Self-pay | Admitting: Internal Medicine

## 2018-05-21 DIAGNOSIS — C183 Malignant neoplasm of hepatic flexure: Secondary | ICD-10-CM

## 2018-05-21 DIAGNOSIS — N63 Unspecified lump in unspecified breast: Secondary | ICD-10-CM | POA: Diagnosis not present

## 2018-05-21 NOTE — Assessment & Plan Note (Addendum)
Healing well post-op -  s/p lap R colectomy on 05/01/18 - Dr Joyice Faster  Scans reports reviewed

## 2018-05-21 NOTE — Assessment & Plan Note (Signed)
R lateral breast lump 3x4 cm between 8 and 11 o'clock Diagnostic mammo

## 2018-05-21 NOTE — Progress Notes (Signed)
Subjective:  Patient ID: Aaron Sharp, male    DOB: 05/04/1965  Age: 53 y.o. MRN: [id number]  CC: No chief complaint on file.   HPI Rayvion Stumph presents for colon cancer f/u C/o R breast pain x years; playing billiard. H/o benign biopsy at 109  Outpatient Medications Prior to Visit  Medication Sig Dispense Refill  . ibuprofen (ADVIL,MOTRIN) 200 MG tablet Take 400 mg by mouth every 6 (six) hours as needed for moderate pain.    . meclizine (ANTIVERT) 12.5 MG tablet Take 12.5 mg by mouth 3 (three) times daily as needed for dizziness.    Marland Kitchen azithromycin (ZITHROMAX) 250 MG tablet Take 2 tablets by mouth at once today, then one tablet daily x 4 days. 6 tablet 0  . traMADol (ULTRAM) 50 MG tablet Take 1 tablet (50 mg total) by mouth every 6 (six) hours as needed (mild pain). 30 tablet 0   No facility-administered medications prior to visit.     ROS: Review of Systems  Constitutional: Negative for appetite change, fatigue and unexpected weight change.  HENT: Negative for congestion, nosebleeds, sneezing, sore throat and trouble swallowing.   Eyes: Negative for itching and visual disturbance.  Respiratory: Negative for cough.   Cardiovascular: Negative for chest pain, palpitations and leg swelling.  Gastrointestinal: Positive for abdominal pain. Negative for abdominal distention, blood in stool, diarrhea and nausea.  Genitourinary: Negative for frequency and hematuria.  Musculoskeletal: Negative for back pain, gait problem, joint swelling and neck pain.  Skin: Negative for rash.  Neurological: Negative for dizziness, tremors, speech difficulty and weakness.  Psychiatric/Behavioral: Negative for agitation, dysphoric mood, sleep disturbance and suicidal ideas. The patient is not nervous/anxious.     Objective:  BP 118/70 (BP Location: Left Arm, Patient Position: Sitting, Cuff Size: Normal)   Pulse 60   Temp 97.7 F (36.5 C) (Oral)   Ht 5' 11.5" (1.816 m)   Wt 191 lb (86.6 kg)    SpO2 98%   BMI 26.27 kg/m   BP Readings from Last 3 Encounters:  05/21/18 118/70  05/05/18 113/68  04/28/18 132/80    Wt Readings from Last 3 Encounters:  05/21/18 191 lb (86.6 kg)  05/05/18 196 lb (88.9 kg)  04/28/18 203 lb 1.6 oz (92.1 kg)    Physical Exam Constitutional:      General: He is not in acute distress.    Appearance: He is well-developed.     Comments: NAD  Eyes:     Conjunctiva/sclera: Conjunctivae normal.     Pupils: Pupils are equal, round, and reactive to light.  Neck:     Musculoskeletal: Normal range of motion.     Thyroid: No thyromegaly.     Vascular: No JVD.  Cardiovascular:     Rate and Rhythm: Normal rate and regular rhythm.     Heart sounds: Normal heart sounds. No murmur. No friction rub. No gallop.   Pulmonary:     Effort: Pulmonary effort is normal. No respiratory distress.     Breath sounds: Normal breath sounds. No wheezing or rales.  Chest:     Chest wall: No tenderness.  Abdominal:     General: Bowel sounds are normal. There is no distension.     Palpations: Abdomen is soft. There is no mass.     Tenderness: There is no abdominal tenderness. There is no guarding or rebound.  Musculoskeletal: Normal range of motion.        General: No tenderness.  Lymphadenopathy:     Cervical:  No cervical adenopathy.  Skin:    General: Skin is warm and dry.     Findings: No rash.  Neurological:     Mental Status: He is alert and oriented to person, place, and time.     Cranial Nerves: No cranial nerve deficit.     Motor: No abnormal muscle tone.     Coordination: Coordination normal.     Gait: Gait normal.     Deep Tendon Reflexes: Reflexes are normal and symmetric.  Psychiatric:        Behavior: Behavior normal.        Thought Content: Thought content normal.        Judgment: Judgment normal.     R lateral breast lump 3x4 cm between 8 and 11 o'clock    Lab Results  Component Value Date   WBC 10.7 (H) 05/04/2018   HGB 9.5 (L)  05/04/2018   HCT 29.5 (L) 05/04/2018   PLT 191 05/04/2018   GLUCOSE 99 05/04/2018   CHOL 179 01/09/2018   TRIG 168.0 (H) 01/09/2018   HDL 36.30 (L) 01/09/2018   LDLDIRECT 125.0 08/03/2016   LDLCALC 109 (H) 01/09/2018   ALT 24 03/19/2018   AST 21 03/19/2018   NA 139 05/04/2018   K 3.6 05/04/2018   CL 106 05/04/2018   CREATININE 1.02 05/04/2018   BUN 13 05/04/2018   CO2 27 05/04/2018   TSH 3.71 01/09/2018   PSA 2.33 01/09/2018   INR 1.09 11/17/2009    No results found.  Assessment & Plan:   There are no diagnoses linked to this encounter.   No orders of the defined types were placed in this encounter.    Follow-up: No follow-ups on file.  Walker Kehr, MD

## 2018-05-23 ENCOUNTER — Telehealth: Payer: Self-pay | Admitting: Internal Medicine

## 2018-05-23 DIAGNOSIS — N63 Unspecified lump in unspecified breast: Secondary | ICD-10-CM

## 2018-05-23 NOTE — Telephone Encounter (Signed)
Copied from [place] [phone]. Topic: Quick Communication - See Telephone Encounter >> May 23, 2018  2:23 PM Blase Mess A wrote: CRM for notification. See Telephone encounter for: 05/23/18.  Cherish is calling from [facility]. The patient is calling because the patient will be coming in 05/26/18 for a diagnotic Mammogram and an ultrasound. Requesting a referral to be placed. Used Epic. Can fax the one sent over [phone]

## 2018-05-23 NOTE — Telephone Encounter (Signed)
Routing to dr plotnikov, please advise, thanks 

## 2018-05-26 ENCOUNTER — Ambulatory Visit
Admission: RE | Admit: 2018-05-26 | Discharge: 2018-05-26 | Disposition: A | Discharge status: Home / Self Care | Payer: PRIVATE HEALTH INSURANCE | Source: Ambulatory Visit | Attending: Internal Medicine | Admitting: Internal Medicine

## 2018-05-26 ENCOUNTER — Ambulatory Visit: Payer: PRIVATE HEALTH INSURANCE

## 2018-05-27 NOTE — Telephone Encounter (Signed)
Ok Thx 

## 2018-05-29 NOTE — Telephone Encounter (Signed)
Routing to betty---can we make sure this is right referral i'm seeing?  Let me know if I need to call breast center, thanks

## 2018-05-30 NOTE — Telephone Encounter (Signed)
DX mammo was ordered, [place] ordered now

## 2019-01-08 ENCOUNTER — Other Ambulatory Visit: Payer: Self-pay

## 2019-01-08 ENCOUNTER — Other Ambulatory Visit (INDEPENDENT_AMBULATORY_CARE_PROVIDER_SITE_OTHER): Payer: PRIVATE HEALTH INSURANCE

## 2019-01-08 DIAGNOSIS — Z125 Encounter for screening for malignant neoplasm of prostate: Secondary | ICD-10-CM | POA: Diagnosis not present

## 2019-01-08 DIAGNOSIS — Z0000 Encounter for general adult medical examination without abnormal findings: Secondary | ICD-10-CM

## 2019-01-08 LAB — CBC WITH DIFFERENTIAL/PLATELET
Basophils Absolute: 0 10*3/uL (ref 0.0–0.1)
Basophils Relative: 0.7 % (ref 0.0–3.0)
Eosinophils Absolute: 0.1 10*3/uL (ref 0.0–0.7)
Eosinophils Relative: 1.7 % (ref 0.0–5.0)
HCT: 45.5 % (ref 39.0–52.0)
Hemoglobin: 15.6 g/dL (ref 13.0–17.0)
Lymphocytes Relative: 30.9 % (ref 12.0–46.0)
Lymphs Abs: 1.8 10*3/uL (ref 0.7–4.0)
MCHC: 34.2 g/dL (ref 30.0–36.0)
MCV: 94.5 fl (ref 78.0–100.0)
Monocytes Absolute: 0.6 10*3/uL (ref 0.1–1.0)
Monocytes Relative: 9.3 % (ref 3.0–12.0)
Neutro Abs: 3.4 10*3/uL (ref 1.4–7.7)
Neutrophils Relative %: 57.4 % (ref 43.0–77.0)
Platelets: 210 10*3/uL (ref 150.0–400.0)
RBC: 4.82 Mil/uL (ref 4.22–5.81)
RDW: 12.3 % (ref 11.5–15.5)
WBC: 6 10*3/uL (ref 4.0–10.5)

## 2019-01-08 LAB — URINALYSIS, ROUTINE W REFLEX MICROSCOPIC
Bilirubin Urine: NEGATIVE
Hgb urine dipstick: NEGATIVE
Ketones, ur: NEGATIVE
Nitrite: NEGATIVE
RBC / HPF: NONE SEEN (ref 0–?)
Specific Gravity, Urine: >=1.03 — AB (ref 1.000–1.030)
Total Protein, Urine: NEGATIVE
Urine Glucose: NEGATIVE
Urobilinogen, UA: 0.2 (ref 0.0–1.0)
pH: 5.5 (ref 5.0–8.0)

## 2019-01-08 LAB — TSH: TSH: 2.33 u[IU]/mL (ref 0.35–4.50)

## 2019-01-08 LAB — LIPID PANEL
Cholesterol: 222 mg/dL — ABNORMAL HIGH (ref 0–200)
HDL: 41 mg/dL (ref >39.00)
NonHDL: 180.93
Total CHOL/HDL Ratio: 5
Triglycerides: 304 mg/dL — ABNORMAL HIGH (ref 0.0–149.0)
VLDL: 60.8 mg/dL — ABNORMAL HIGH (ref 0.0–40.0)

## 2019-01-08 LAB — BASIC METABOLIC PANEL
BUN: 15 mg/dL (ref 6–23)
CO2: 26 mEq/L (ref 19–32)
Calcium: 10.2 mg/dL (ref 8.4–10.5)
Chloride: 105 mEq/L (ref 96–112)
Creatinine, Ser: 1.17 mg/dL (ref 0.40–1.50)
GFR: 65.7 mL/min (ref >60.00)
Glucose, Bld: 83 mg/dL (ref 70–99)
Potassium: 4.4 mEq/L (ref 3.5–5.1)
Sodium: 140 mEq/L (ref 135–145)

## 2019-01-08 LAB — HEPATIC FUNCTION PANEL
ALT: 22 U/L (ref 0–53)
AST: 21 U/L (ref 0–37)
Albumin: 4.8 g/dL (ref 3.5–5.2)
Alkaline Phosphatase: 68 U/L (ref 39–117)
Bilirubin, Direct: 0.1 mg/dL (ref 0.0–0.3)
Total Bilirubin: 0.6 mg/dL (ref 0.2–1.2)
Total Protein: 7.6 g/dL (ref 6.0–8.3)

## 2019-01-08 LAB — LDL CHOLESTEROL, DIRECT: Direct LDL: 124 mg/dL

## 2019-01-08 LAB — PSA: PSA: 2.19 ng/mL (ref 0.10–4.00)

## 2019-01-13 ENCOUNTER — Other Ambulatory Visit: Payer: Self-pay

## 2019-01-13 ENCOUNTER — Ambulatory Visit (INDEPENDENT_AMBULATORY_CARE_PROVIDER_SITE_OTHER): Payer: PRIVATE HEALTH INSURANCE | Admitting: Internal Medicine

## 2019-01-13 ENCOUNTER — Encounter: Payer: Self-pay | Admitting: Internal Medicine

## 2019-01-13 VITALS — BP 120/76 | HR 53 | Temp 97.9°F | Ht 71.5 in | Wt 198.0 lb

## 2019-01-13 DIAGNOSIS — Z0000 Encounter for general adult medical examination without abnormal findings: Secondary | ICD-10-CM

## 2019-01-13 DIAGNOSIS — C183 Malignant neoplasm of hepatic flexure: Secondary | ICD-10-CM | POA: Diagnosis not present

## 2019-01-13 DIAGNOSIS — Z23 Encounter for immunization: Secondary | ICD-10-CM | POA: Diagnosis not present

## 2019-01-13 DIAGNOSIS — R972 Elevated prostate specific antigen [PSA]: Secondary | ICD-10-CM

## 2019-01-13 MED ORDER — VITAMIN D3 50 MCG (2000 UT) PO CAPS: 2000.0000 [IU] | ORAL_CAPSULE | Freq: Every day | ORAL | 3 refills | Status: DC

## 2019-01-13 NOTE — Assessment & Plan Note (Signed)
PSA

## 2019-01-13 NOTE — Assessment & Plan Note (Signed)
We discussed age appropriate health related issues, including available/recomended screening tests and vaccinations. We discussed a need for adhering to healthy diet and exercise. Labs were ordered to be later reviewed . All questions were answered. Yearly skin exams by Derm

## 2019-01-13 NOTE — Patient Instructions (Signed)
  Mediterranean diet is good for you. ([employer] has a typical Mediterranean cuisine menu) The Mediterranean diet is a way of eating based on the traditional cuisine of countries bordering the [place] Sea. While there is no single definition of the Mediterranean diet, it is typically high in vegetables, fruits, whole grains, beans, nut and seeds, and olive oil. The main components of Mediterranean diet include: . Daily consumption of vegetables, fruits, whole grains and healthy fats  . Weekly intake of fish, poultry, beans and eggs  . Moderate portions of dairy products  . Limited intake of red meat Other important elements of the Mediterranean diet are sharing meals with family and friends, enjoying a glass of red wine and being physically active. Health benefits of a Mediterranean diet: A traditional Mediterranean diet consisting of large quantities of fresh fruits and vegetables, nuts, fish and olive oil-coupled with physical activity-can reduce your risk of serious mental and physical health problems by: Preventing heart disease and strokes. Following a Mediterranean diet limits your intake of refined breads, processed foods, and red meat, and encourages drinking red wine instead of hard liquor-all factors that can help prevent heart disease and stroke. Keeping you agile. If you're an older adult, the nutrients gained with a Mediterranean diet may reduce your risk of developing muscle weakness and other signs of frailty by about 70 percent. Reducing the risk of Alzheimer's. Research suggests that the Mediterranean diet may improve cholesterol, blood sugar levels, and overall blood vessel health, which in turn may reduce your risk of Alzheimer's disease or dementia. Halving the risk of Parkinson's disease. The high levels of antioxidants in the Mediterranean diet can prevent cells from undergoing a damaging process called oxidative stress, thereby cutting the risk of Parkinson's disease in  half. Increasing longevity. By reducing your risk of developing heart disease or cancer with the Mediterranean diet, you're reducing your risk of death at any age by 20%. Protecting against type 2 diabetes. A Mediterranean diet is rich in fiber which digests slowly, prevents huge swings in blood sugar, and can help you maintain a healthy weight.   

## 2019-01-13 NOTE — Progress Notes (Signed)
Subjective:  Patient ID: Aaron Sharp, male    DOB: 1965-11-14  Age: 53 y.o. MRN: [id number]  CC: No chief complaint on file.   HPI Aaron Sharp presents for A WELL EXAM C/o R shoulder pain  Outpatient Medications Prior to Visit  Medication Sig Dispense Refill  . ibuprofen (ADVIL,MOTRIN) 200 MG tablet Take 400 mg by mouth every 6 (six) hours as needed for moderate pain.    . meclizine (ANTIVERT) 12.5 MG tablet Take 12.5 mg by mouth 3 (three) times daily as needed for dizziness.     No facility-administered medications prior to visit.     ROS: Review of Systems  Constitutional: Negative for appetite change, fatigue and unexpected weight change.  HENT: Negative for congestion, nosebleeds, sneezing, sore throat and trouble swallowing.   Eyes: Negative for itching and visual disturbance.  Respiratory: Negative for cough.   Cardiovascular: Negative for chest pain, palpitations and leg swelling.  Gastrointestinal: Negative for abdominal distention, blood in stool, diarrhea and nausea.  Genitourinary: Negative for frequency and hematuria.  Musculoskeletal: Positive for arthralgias. Negative for back pain, gait problem, joint swelling and neck pain.  Skin: Negative for rash.  Neurological: Negative for dizziness, tremors, speech difficulty and weakness.  Psychiatric/Behavioral: Negative for agitation, dysphoric mood and sleep disturbance. The patient is not nervous/anxious.     Objective:  BP 120/76 (BP Location: Left Arm, Patient Position: Sitting, Cuff Size: Normal)   Pulse (!) 53   Temp 97.9 F (36.6 C) (Oral)   Ht 5' 11.5" (1.816 m)   Wt 198 lb (89.8 kg)   SpO2 98%   BMI 27.23 kg/m   BP Readings from Last 3 Encounters:  01/13/19 120/76  05/21/18 118/70  05/05/18 113/68    Wt Readings from Last 3 Encounters:  01/13/19 198 lb (89.8 kg)  05/21/18 191 lb (86.6 kg)  05/05/18 196 lb (88.9 kg)    Physical Exam Constitutional:      General: He is not  in acute distress.    Appearance: He is well-developed.     Comments: NAD  Eyes:     Conjunctiva/sclera: Conjunctivae normal.     Pupils: Pupils are equal, round, and reactive to light.  Neck:     Musculoskeletal: Normal range of motion.     Thyroid: No thyromegaly.     Vascular: No JVD.  Cardiovascular:     Rate and Rhythm: Normal rate and regular rhythm.     Heart sounds: Normal heart sounds. No murmur. No friction rub. No gallop.   Pulmonary:     Effort: Pulmonary effort is normal. No respiratory distress.     Breath sounds: Normal breath sounds. No wheezing or rales.  Chest:     Chest wall: No tenderness.  Abdominal:     General: Bowel sounds are normal. There is no distension.     Palpations: Abdomen is soft. There is no mass.     Tenderness: There is no abdominal tenderness. There is no guarding or rebound.  Musculoskeletal: Normal range of motion.        General: Tenderness present.  Lymphadenopathy:     Cervical: No cervical adenopathy.  Skin:    General: Skin is warm and dry.     Findings: No rash.  Neurological:     Mental Status: He is alert and oriented to person, place, and time.     Cranial Nerves: No cranial nerve deficit.     Motor: No abnormal muscle tone.  Coordination: Coordination normal.     Gait: Gait normal.     Deep Tendon Reflexes: Reflexes are normal and symmetric.  Psychiatric:        Behavior: Behavior normal.        Thought Content: Thought content normal.        Judgment: Judgment normal.     Lab Results  Component Value Date   WBC 6.0 01/08/2019   HGB 15.6 01/08/2019   HCT 45.5 01/08/2019   PLT 210.0 01/08/2019   GLUCOSE 83 01/08/2019   CHOL 222 (H) 01/08/2019   TRIG 304.0 (H) 01/08/2019   HDL 41.00 01/08/2019   LDLDIRECT 124.0 01/08/2019   LDLCALC 109 (H) 01/09/2018   ALT 22 01/08/2019   AST 21 01/08/2019   NA 140 01/08/2019   K 4.4 01/08/2019   CL 105 01/08/2019   CREATININE 1.17 01/08/2019   BUN 15 01/08/2019   CO2 26  01/08/2019   TSH 2.33 01/08/2019   PSA 2.19 01/08/2019   INR 1.09 11/17/2009    Mm Diag Breast Tomo Bilateral  Result Date: 05/26/2018 CLINICAL DATA:  53 year old male with recent right breast tenderness/focal soreness and lump predominately in the 8-11 o'clock region. Recent surgery for colon cancer. Denies taking any medication. EXAM: DIGITAL DIAGNOSTIC BILATERAL MAMMOGRAM WITH CAD AND TOMO COMPARISON:  None ACR Breast Density Category b: There are scattered areas of fibroglandular density. FINDINGS: There is moderate right gynecomastia predominantly in the retroareolar and outer right breast. There is no mass, suspicious microcalcification, or architectural distortion. There is minimal retroareolar left gynecomastia. No findings for suspicious for malignancy on the left. Axillary regions are unremarkable. On physical exam I palpate spongy fullness in the retroareolar and outer periareolar right breast. No suspicious mass is palpated. Mammographic images were processed with CAD. IMPRESSION: Moderate right gynecomastia. Minimal left gynecomastia. No evidence of malignancy in either breast. RECOMMENDATION: No further imaging follow-up is recommended unless new or progressive areas of concern arise. I have discussed the findings and recommendations with the patient. Results were also provided in writing at the conclusion of the visit. If applicable, a reminder letter will be sent to the patient regarding the next appointment. BI-RADS CATEGORY  2: Benign. Electronically Signed   By: Curlene Dolphin M.D.   On: 05/26/2018 13:54    Assessment & Plan:   There are no diagnoses linked to this encounter.   No orders of the defined types were placed in this encounter.    Follow-up: No follow-ups on file.  Walker Kehr, MD

## 2019-01-13 NOTE — Assessment & Plan Note (Signed)
F/u colonoscopy

## 2019-01-13 NOTE — Addendum Note (Signed)
Addended by: Karren Cobble on: 01/13/2019 12:01 PM   Modules accepted: Orders

## 2019-01-22 ENCOUNTER — Ambulatory Visit (INDEPENDENT_AMBULATORY_CARE_PROVIDER_SITE_OTHER): Payer: PRIVATE HEALTH INSURANCE | Admitting: Family Medicine

## 2019-01-22 ENCOUNTER — Encounter: Payer: Self-pay | Admitting: Family Medicine

## 2019-01-22 ENCOUNTER — Other Ambulatory Visit: Payer: Self-pay

## 2019-01-22 VITALS — BP 130/82 | HR 61 | Ht 71.5 in | Wt 197.2 lb

## 2019-01-22 DIAGNOSIS — M999 Biomechanical lesion, unspecified: Secondary | ICD-10-CM | POA: Diagnosis not present

## 2019-01-22 DIAGNOSIS — M940 Chondrocostal junction syndrome [Tietze]: Secondary | ICD-10-CM | POA: Diagnosis not present

## 2019-01-22 MED ORDER — TIZANIDINE HCL 4 MG PO CAPS: 4.0000 mg | ORAL_CAPSULE | Freq: Every day | ORAL | 3 refills | Status: DC

## 2019-01-22 MED ORDER — VITAMIN D (ERGOCALCIFEROL) 1.25 MG (50000 UNIT) PO CAPS: 50000.0000 [IU] | ORAL_CAPSULE | ORAL | 0 refills | Status: DC

## 2019-01-22 NOTE — Assessment & Plan Note (Signed)
Decision today to treat with OMT was based on Physical Exam  After verbal consent patient was treated with HVLA, ME, FPR techniques in cervical, thoracic, lumbar and sacral areas  Patient tolerated the procedure well with improvement in symptoms  Patient given exercises, stretches and lifestyle modifications  See medications in patient instructions if given  Patient will follow up in 4-8 weeks 

## 2019-01-22 NOTE — Progress Notes (Signed)
[facility] [address] [place], [place] [address] Phone: [phone] Subjective:      CC: neck pain   [id number]    I, Wendy Poet, LAT, ATC, am serving as scribe for Dr. Hulan Saas.  05/16/17: Sipped rib: Overall doing relatively well.  Continues to have some tightness of the mid back as well.  Responded well to osteopathic manipulation.  No longer having the radicular symptoms but still has a catching sensation from time to time.  Patient will be leaving for many more months but will see me again just in case in 4 weeks.  Follow-up at that time  Update- 01/22/19 Aaron [facility] is a 53 y.o. male coming in with complaint of neck, R shoulder and R bicep pain.  Pt reports prior RC surgery 9-10 years ago and has had issues every since.  He states that he has been having issues w/ his neck over the past 2-3 years.  Pt states that his neck is bothering him the most currently and rates his pain at a 5-6/10.  He reports some intermittent radiating pain into the R upper arm and tingling in his neck.  He has tried stretching but no other treatments.      Past Medical History:  Diagnosis Date  . Bronchitis 04/17/2018   diagnosed with bronchitis and placed on Antibiotics  . 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 GIST (gastrointestinal stromal tumor), non-malignant   . Hiatal hernia    Past Surgical History:  Procedure Laterality Date  . COLONOSCOPY WITH PROPOFOL N/A 03/13/2018   Procedure: COLONOSCOPY WITH PROPOFOL;  Surgeon: [name], MD;  Location: [facility];  Service: Endoscopy;  Laterality: N/A;  . ESOPHAGOGASTRODUODENOSCOPY N/A 03/13/2018   Procedure: ESOPHAGOGASTRODUODENOSCOPY (EGD);  Surgeon: [name], MD;  Location: [facility];  Service: Endoscopy;  Laterality: N/A;  . EUS N/A 03/13/2018   Procedure: UPPER ENDOSCOPIC ULTRASOUND (EUS) RADIAL;  Surgeon: [name], MD;  [facility];  Service: Endoscopy;   Laterality: N/A;  . FINE NEEDLE ASPIRATION N/A 03/13/2018   Procedure: FINE NEEDLE ASPIRATION (FNA) LINEAR;  Surgeon: [name], MD;  [facility];  Service: Endoscopy;  Laterality: N/A;  . LAPAROSCOPIC PARTIAL COLECTOMY N/A 05/01/2018   Procedure: LAPAROSCOPIC PARTIAL COLECTOMY;  Surgeon: Leighton Ruff, MD;  Location: [facility];  Service: General;  Laterality: N/A;  . POLYPECTOMY  03/13/2018   Procedure: POLYPECTOMY;  Surgeon: [name], MD;  [facility];  Service: Endoscopy;;  . SHOULDER SURGERY    . WISDOM TOOTH EXTRACTION     Social History   Socioeconomic History  . Marital status: Married    Spouse name: Not on file  . Number of children: 2  . Years of education: Not on file  . Highest education level: Not on file  Occupational History  . Occupation: Data processing manager: [employer]  . Financial resource strain: Not on file  . Food insecurity    Worry: Not on file    Inability: Not on file  . Transportation needs    Medical: Not on file    Non-medical: Not on file  Tobacco Use  . Smoking status: Never Smoker  . Smokeless tobacco: Never Used  Substance and Sexual Activity  . Alcohol use: Yes  . Drug use: No  . Sexual activity: Yes  Lifestyle  . Physical activity    Days per week: Not on  file    Minutes per session: Not on file  . Stress: Not on file  Relationships  . Social Herbalist on phone: Not on file    Gets together: Not on file    Attends religious service: Not on file    Active member of club or organization: Not on file    Attends meetings of clubs or organizations: Not on file    Relationship status: Not on file  Other Topics Concern  . Not on file  Social History Narrative  . Not on file   No Known Allergies Family History  Problem Relation Age of Onset  . Depression Mother   . Colon cancer Paternal Grandfather   . Prostate cancer Other   . Hypertension Daughter   .  Breast cancer Paternal Aunt        Current Outpatient Medications (Analgesics):  .  ibuprofen (ADVIL,MOTRIN) 200 MG tablet, Take 400 mg by mouth every 6 (six) hours as needed for moderate pain.   Current Outpatient Medications (Other):  Marland Kitchen  Cholecalciferol (VITAMIN D3) 50 MCG (2000 UT) capsule, Take 1 capsule (2,000 Units total) by mouth daily. (Patient not taking: Reported on 01/22/2019) .  meclizine (ANTIVERT) 12.5 MG tablet, Take 12.5 mg by mouth 3 (three) times daily as needed for dizziness. Marland Kitchen  tiZANidine (ZANAFLEX) 4 MG capsule, Take 1 capsule (4 mg total) by mouth daily. Take 1 capsule by mouth daily at night .  Vitamin D, Ergocalciferol, (DRISDOL) 1.25 MG (50000 UT) CAPS capsule, Take 1 capsule (50,000 Units total) by mouth every 7 (seven) days.    Past medical history, social, surgical and family history all reviewed in electronic medical record.  No pertanent information unless stated regarding to the chief complaint.   Review of Systems:  No headache, visual changes, nausea, vomiting, diarrhea, constipation, dizziness, abdominal pain, skin rash, fevers, chills, night sweats, weight loss, swollen lymph nodes, body aches, joint swelling,  chest pain, shortness of breath, mood changes.  Positive muscle aches  Objective  Blood pressure 130/82, pulse 61, height 5' 11.5" (1.816 m), weight 197 lb 3.2 oz (89.4 kg), SpO2 96 %.    General: No apparent distress alert and oriented x3 mood and affect normal, dressed appropriately.  HEENT: Pupils equal, extraocular movements intact  Respiratory: Patient's speak in full sentences and does not appear short of breath  Cardiovascular: No lower extremity edema, non tender, no erythema  Skin: Warm dry intact with no signs of infection or rash on extremities or on axial skeleton.  Abdomen: Soft nontender  Neuro: Cranial nerves II through XII are intact, neurovascularly intact in all extremities with 2+ DTRs and 2+ pulses.  Lymph: No  lymphadenopathy of posterior or anterior cervical chain or axillae bilaterally.  Gait very mild antalgic.  MSK:  Non tender with full range of motion and good stability and symmetric strength and tone of shoulders, elbows, wrist, hip, knee and ankles bilaterally.  Neck exam shows the patient has some loss of lordosis.  Patient is tender to palpation in paraspinal musculature cervical and thoracic spine.  There are some surrounding degenerative tightness in the parascapular region on the right side.  Negative Spurling's.  5 out of 5 strength of the upper extremities bilaterally  Osteopathic findings  C6 flexed rotated and side bent left T3 extended rotated and side bent right inhaled third rib T9 extended rotated and side bent left L1 flexed rotated and side bent right Sacrum right on right  Impression and Recommendations:     This case required medical decision making of moderate complexity. The above documentation has been reviewed and is accurate and complete Lyndal Pulley, DO       Note: This dictation was prepared with Dragon dictation along with smaller phrase technology. Any transcriptional errors that result from this process are unintentional.

## 2019-01-22 NOTE — Assessment & Plan Note (Signed)
No radicular symptoms at this time.  I discussed with patient in great length medicine regimen, home exercise, which activities to do which wants to avoid.  Patient should increase activity as tolerated.  Follow-up again in 4 to 8 weeks

## 2019-01-22 NOTE — Patient Instructions (Signed)
Exercises 3x/week  Zanaflex 4 mg #30, 3 refills at night  Tart cherry 1200 mg daily  Vit D for 12 weeks  See me again in 5-6 weeks

## 2019-02-25 ENCOUNTER — Encounter: Payer: Self-pay | Admitting: Family Medicine

## 2019-02-25 ENCOUNTER — Other Ambulatory Visit: Payer: Self-pay

## 2019-02-25 ENCOUNTER — Ambulatory Visit (INDEPENDENT_AMBULATORY_CARE_PROVIDER_SITE_OTHER): Payer: PRIVATE HEALTH INSURANCE | Admitting: Family Medicine

## 2019-02-25 VITALS — BP 140/90 | HR 58 | Ht 71.5 in | Wt 199.0 lb

## 2019-02-25 DIAGNOSIS — M999 Biomechanical lesion, unspecified: Secondary | ICD-10-CM | POA: Diagnosis not present

## 2019-02-25 DIAGNOSIS — M940 Chondrocostal junction syndrome [Tietze]: Secondary | ICD-10-CM

## 2019-02-25 NOTE — Assessment & Plan Note (Signed)
Some difficulty still with the posture and ergonomics throughout the day.  Notably that this contributes to some of the discomfort and pain and muscle imbalances that increase the likelihood.  We discussed which activities to do which wants to avoid.  Discussed topical anti-inflammatories.  Follow-up again in 4 to 8 weeks.

## 2019-02-25 NOTE — Progress Notes (Signed)
[facility] [address] [place], [place] [address] Phone: [phone] Subjective:   I Aaron Sharp am serving as a Education administrator for Dr. Hulan Saas.   CC: Low back and shoulder pain  [id number]  Aaron Sharp is a 53 y.o. male coming in with complaint of back and shoulder pain. Last seen on 01/22/2019 for OMT. Patient states he is doing a little better today.  Mild increase in neck pain recently as well.  Has been working on a computer little bit more.     Past Medical History:  Diagnosis Date  . Bronchitis 04/17/2018   diagnosed with bronchitis and placed on Antibiotics  . 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 GIST (gastrointestinal stromal tumor), non-malignant   . Hiatal hernia    Past Surgical History:  Procedure Laterality Date  . COLONOSCOPY WITH PROPOFOL N/A 03/13/2018   Procedure: COLONOSCOPY WITH PROPOFOL;  Surgeon: [name], MD;  [facility];  Service: Endoscopy;  Laterality: N/A;  . ESOPHAGOGASTRODUODENOSCOPY N/A 03/13/2018   Procedure: ESOPHAGOGASTRODUODENOSCOPY (EGD);  Surgeon: [name], MD;  Location: [facility];  Service: Endoscopy;  Laterality: N/A;  . EUS N/A 03/13/2018   Procedure: UPPER ENDOSCOPIC ULTRASOUND (EUS) RADIAL;  Surgeon: [name], MD;  [facility];  Service: Endoscopy;  Laterality: N/A;  . FINE NEEDLE ASPIRATION N/A 03/13/2018   Procedure: FINE NEEDLE ASPIRATION (FNA) LINEAR;  Surgeon: [name], MD;  [facility];  Service: Endoscopy;  Laterality: N/A;  . LAPAROSCOPIC PARTIAL COLECTOMY N/A 05/01/2018   Procedure: LAPAROSCOPIC PARTIAL COLECTOMY;  Surgeon: Leighton Ruff, MD;  [facility];  Service: General;  Laterality: N/A;  . POLYPECTOMY  03/13/2018   Procedure: POLYPECTOMY;  Surgeon: [name], MD;  [facility];  Service: Endoscopy;;  . SHOULDER SURGERY    . WISDOM TOOTH EXTRACTION     Social History   Socioeconomic  History  . Marital status: Married    Spouse name: Not on file  . Number of children: 2  . Years of education: Not on file  . Highest education level: Not on file  Occupational History  . Occupation: Data processing manager: [employer]  . Financial resource strain: Not on file  . Food insecurity    Worry: Not on file    Inability: Not on file  . Transportation needs    Medical: Not on file    Non-medical: Not on file  Tobacco Use  . Smoking status: Never Smoker  . Smokeless tobacco: Never Used  Substance and Sexual Activity  . Alcohol use: Yes  . Drug use: No  . Sexual activity: Yes  Lifestyle  . Physical activity    Days per week: Not on file    Minutes per session: Not on file  . Stress: Not on file  Relationships  . Social Herbalist on phone: Not on file    Gets together: Not on file    Attends religious service: Not on file    Active member of club or organization: Not on file    Attends meetings of clubs or organizations: Not on file    Relationship status: Not on file  Other Topics Concern  . Not on file  Social History Narrative  . Not on file   No Known Allergies Family History  Problem Relation Age of Onset  . Depression Mother   . Colon cancer  Paternal Grandfather   . Prostate cancer Other   . Hypertension Daughter   . Breast cancer Paternal Aunt        Current Outpatient Medications (Analgesics):  .  ibuprofen (ADVIL,MOTRIN) 200 MG tablet, Take 400 mg by mouth every 6 (six) hours as needed for moderate pain.   Current Outpatient Medications (Other):  Marland Kitchen  Cholecalciferol (VITAMIN D3) 50 MCG (2000 UT) capsule, Take 1 capsule (2,000 Units total) by mouth daily. .  meclizine (ANTIVERT) 12.5 MG tablet, Take 12.5 mg by mouth 3 (three) times daily as needed for dizziness. Marland Kitchen  tiZANidine (ZANAFLEX) 4 MG capsule, Take 1 capsule (4 mg total) by mouth daily. Take 1 capsule by mouth daily at night .  Vitamin D,  Ergocalciferol, (DRISDOL) 1.25 MG (50000 UT) CAPS capsule, Take 1 capsule (50,000 Units total) by mouth every 7 (seven) days.    Past medical history, social, surgical and family history all reviewed in electronic medical record.  No pertanent information unless stated regarding to the chief complaint.   Review of Systems:  No headache, visual changes, nausea, vomiting, diarrhea, constipation, dizziness, abdominal pain, skin rash, fevers, chills, night sweats, weight loss, swollen lymph nodes, body aches, joint swelling,  chest pain, shortness of breath, mood changes.  Positive muscle aches  Objective  Blood pressure 140/90, pulse (!) 58, height 5' 11.5" (1.816 m), weight 199 lb (90.3 kg), SpO2 98 %. f    General: No apparent distress alert and oriented x3 mood and affect normal, dressed appropriately.  HEENT: Pupils equal, extraocular movements intact  Respiratory: Patient's speak in full sentences and does not appear short of breath  Cardiovascular: No lower extremity edema, non tender, no erythema  Skin: Warm dry intact with no signs of infection or rash on extremities or on axial skeleton.  Abdomen: Soft nontender  Neuro: Cranial nerves II through XII are intact, neurovascularly intact in all extremities with 2+ DTRs and 2+ pulses.  Lymph: No lymphadenopathy of posterior or anterior cervical chain or axillae bilaterally.  Gait normal with good balance and coordination.  MSK:  Non tender with full range of motion and good stability and symmetric strength and tone of shoulders, elbows, wrist, hip, knee and ankles bilaterally.  Patient denied exam does have some mild loss of lordosis.  Patient does have some pain in the parascapular region bilaterally.  Patient does have some tightness in the parascapular region negative Spurling's.  5 out of 5 strength of the upper extremities bilaterally.  Osteopathic findings C2 flexed rotated and side bent right T9 extended rotated and side bent left  L2 flexed rotated and side bent right Sacrum right on right    Impression and Recommendations:     This case required medical decision making of moderate complexity. The above documentation has been reviewed and is accurate and complete Lyndal Pulley, DO       Note: This dictation was prepared with Dragon dictation along with smaller phrase technology. Any transcriptional errors that result from this process are unintentional.

## 2019-02-25 NOTE — Assessment & Plan Note (Signed)
Decision today to treat with OMT was based on Physical Exam  After verbal consent patient was treated with HVLA, ME, FPR techniques in cervical, thoracic, lumbar and sacral areas  Patient tolerated the procedure well with improvement in symptoms  Patient given exercises, stretches and lifestyle modifications  See medications in patient instructions if given  Patient will follow up in 4-8 weeks 

## 2019-03-02 ENCOUNTER — Encounter: Payer: Self-pay | Admitting: Gastroenterology

## 2019-03-16 ENCOUNTER — Ambulatory Visit (INDEPENDENT_AMBULATORY_CARE_PROVIDER_SITE_OTHER): Payer: PRIVATE HEALTH INSURANCE | Admitting: Gastroenterology

## 2019-03-16 ENCOUNTER — Other Ambulatory Visit: Payer: Self-pay

## 2019-03-16 ENCOUNTER — Encounter: Payer: Self-pay | Admitting: Gastroenterology

## 2019-03-16 VITALS — BP 120/70 | HR 70 | Temp 97.8°F | Ht 71.5 in | Wt 200.0 lb

## 2019-03-16 DIAGNOSIS — Z1159 Encounter for screening for other viral diseases: Secondary | ICD-10-CM

## 2019-03-16 DIAGNOSIS — Z8505 Personal history of malignant neoplasm of liver: Secondary | ICD-10-CM | POA: Diagnosis not present

## 2019-03-16 DIAGNOSIS — C183 Malignant neoplasm of hepatic flexure: Secondary | ICD-10-CM

## 2019-03-16 MED ORDER — NA SULFATE-K SULFATE-MG SULF 17.5-3.13-1.6 GM/177ML PO SOLN: 1.0000 | Freq: Once | ORAL | 0 refills | Status: AC

## 2019-03-16 MED ORDER — CHOLESTYRAMINE 4 G PO PACK: 4.0000 g | PACK | Freq: Every day | ORAL | 11 refills | Status: DC

## 2019-03-16 NOTE — Progress Notes (Signed)
Review of pertinent gastrointestinal problems: 1.  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  Colonoscopy December 2019 for routine risk screening found this polyp, removed with snare cautery.  Pathology proved adenocarcinoma to the margin.  Follow-up right hemicolectomy January 2020 showed no residual adenocarcinoma at the site.  9 lymph nodes were removed and they were all negative for cancer. 2. 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 Fuller Plan.  Endoscopic ultrasound in 2006 also showed this, measured about 2-1/2 cm, FNA showed spindle cells but no special staining done. Repeat EUS 2012 showed the lesion had not changed in size (over 6 years).  I recommended repeat EUS in 3 years.  Repeat EUS December 2019 showed essentially the same size, FNA again proved rare, bland spindle cells.  I recommended repeat examination at 3-year interval again.   HPI: This is a very pleasant 53 year old man whom I last saw at the time of colonoscopy 2019 December at which time I diagnosed him with a very small hepatic flexure colon cancer.  He ended up having right hemicolectomy which proved no residual carcinoma at the site.  Another tubular adenoma was removed with the specimen.  Since then he has done pretty well he has noticed definitely looser than usual stools and he has had 2 episodes of fecal incontinence with urgency.  He has had no bleeding  ROS: complete GI ROS as described in HPI, all other review negative.  Constitutional:  No unintentional weight loss   Past Medical History:  Diagnosis Date  . Bronchitis 04/17/2018   diagnosed with bronchitis and placed on Antibiotics  . 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 GIST (gastrointestinal stromal tumor), non-malignant   . Hiatal hernia     Past Surgical History:  Procedure Laterality Date  . COLONOSCOPY WITH PROPOFOL N/A 03/13/2018   Procedure: COLONOSCOPY WITH PROPOFOL;  Surgeon: [name], MD;  [facility];   Service: Endoscopy;  Laterality: N/A;  . ESOPHAGOGASTRODUODENOSCOPY N/A 03/13/2018   Procedure: ESOPHAGOGASTRODUODENOSCOPY (EGD);  Surgeon: [name], MD;  Location: [facility];  Service: Endoscopy;  Laterality: N/A;  . EUS N/A 03/13/2018   Procedure: UPPER ENDOSCOPIC ULTRASOUND (EUS) RADIAL;  Surgeon: [name], MD;  [facility];  Service: Endoscopy;  Laterality: N/A;  . FINE NEEDLE ASPIRATION N/A 03/13/2018   Procedure: FINE NEEDLE ASPIRATION (FNA) LINEAR;  Surgeon: [name], MD;  [facility];  Service: Endoscopy;  Laterality: N/A;  . LAPAROSCOPIC PARTIAL COLECTOMY N/A 05/01/2018   Procedure: LAPAROSCOPIC PARTIAL COLECTOMY;  Surgeon: Leighton Ruff, MD;  [facility];  Service: General;  Laterality: N/A;  . POLYPECTOMY  03/13/2018   Procedure: POLYPECTOMY;  Surgeon: [name], MD;  [facility];  Service: Endoscopy;;  . SHOULDER SURGERY    . WISDOM TOOTH EXTRACTION      Current Outpatient Medications  Medication Sig Dispense Refill  . Cholecalciferol (VITAMIN D3) 50 MCG (2000 UT) capsule Take 1 capsule (2,000 Units total) by mouth daily. 100 capsule 3  . ibuprofen (ADVIL,MOTRIN) 200 MG tablet Take 400 mg by mouth every 6 (six) hours as needed for moderate pain.    . meclizine (ANTIVERT) 12.5 MG tablet Take 12.5 mg by mouth 3 (three) times daily as needed for dizziness.     No current facility-administered medications for this visit.    Allergies as of 03/16/2019  . (No Known Allergies)    Family History  Problem Relation Age of Onset  . Depression Mother   .  Colon cancer Paternal Grandfather   . Prostate cancer Other   . Hypertension Daughter   . Breast cancer Paternal Aunt     Social History   Socioeconomic History  . Marital status: Married    Spouse name: Not on file  . Number of children: 2  . Years of education: Not on file  . Highest education level: Not on file  Occupational History  . Occupation:  Data processing manager: [employer]  Tobacco Use  . Smoking status: Never Smoker  . Smokeless tobacco: Never Used  Substance and Sexual Activity  . Alcohol use: Yes  . Drug use: No  . Sexual activity: Yes  Other Topics Concern  . Not on file  Social History Narrative  . Not on file   Social Determinants of Health   Financial Resource Strain:   . Difficulty of Paying Living Expenses: Not on file  Food Insecurity:   . Worried About Charity fundraiser in the Last Year: Not on file  . Ran Out of Food in the Last Year: Not on file  Transportation Needs:   . Lack of Transportation (Medical): Not on file  . Lack of Transportation (Non-Medical): Not on file  Physical Activity:   . Days of Exercise per Week: Not on file  . Minutes of Exercise per Session: Not on file  Stress:   . Feeling of Stress : Not on file  Social Connections:   . Frequency of Communication with Friends and Family: Not on file  . Frequency of Social Gatherings with Friends and Family: Not on file  . Attends Religious Services: Not on file  . Active Member of Clubs or Organizations: Not on file  . Attends Archivist Meetings: Not on file  . Marital Status: Not on file  Intimate Partner Violence:   . Fear of Current or Ex-Partner: Not on file  . Emotionally Abused: Not on file  . Physically Abused: Not on file  . Sexually Abused: Not on file     Physical Exam: BP 120/70   Pulse 70   Temp 97.8 F (36.6 C)   Ht 5' 11.5" (1.816 m)   Wt 200 lb (90.7 kg)   BMI 27.51 kg/m  Constitutional: generally well-appearing Psychiatric: alert and oriented x3 Abdomen: soft, nontender, nondistended, no obvious ascites, no peritoneal signs, normal bowel sounds No peripheral edema noted in lower extremities  Assessment and plan: 53 y.o. male with personal history of very early stage hepatic flexure adenocarcinoma  He is due for surveillance colonoscopy and we will arrange for that to be  done at his soonest convenience.  I am giving him a trial prescription of cholestyramine 4 g to see if that will help with his looser than previous stools.  He has had 2 episodes of fecal incontinence I suspect this is related to his right hemicolectomy, loss of IC valve.  Please see the "Patient Instructions" section for addition details about the plan.  Owens Loffler, MD [facility] 03/16/2019, 2:16 PM

## 2019-03-16 NOTE — Patient Instructions (Addendum)
If you are age 51 or older, your body mass index should be between 23-30. Your Body mass index is 27.51 kg/m. If this is out of the aforementioned range listed, please consider follow up with your Primary Care Provider.  If you are age 21 or younger, your body mass index should be between 19-25. Your Body mass index is 27.51 kg/m. If this is out of the aformentioned range listed, please consider follow up with your Primary Care Provider.    You have been scheduled for a colonoscopy. Please follow written instructions given to you at your visit today.  Please pick up your prep supplies at the pharmacy within the next 1-3 days. If you use inhalers (even only as needed), please bring them with you on the day of your procedure.  We have sent the following medications to your pharmacy for you to pick up at your convenience:  Cholestyramine 4gms daily Suprep  Thank you for choosing me and [facility]

## 2019-03-18 ENCOUNTER — Ambulatory Visit (INDEPENDENT_AMBULATORY_CARE_PROVIDER_SITE_OTHER): Payer: PRIVATE HEALTH INSURANCE

## 2019-03-18 ENCOUNTER — Other Ambulatory Visit: Payer: Self-pay | Admitting: Gastroenterology

## 2019-03-18 DIAGNOSIS — Z1159 Encounter for screening for other viral diseases: Secondary | ICD-10-CM

## 2019-03-19 LAB — SARS CORONAVIRUS 2 (TAT 6-24 HRS): SARS Coronavirus 2: NEGATIVE

## 2019-03-20 ENCOUNTER — Encounter: Payer: Self-pay | Admitting: Gastroenterology

## 2019-03-20 ENCOUNTER — Telehealth: Payer: Self-pay | Admitting: Gastroenterology

## 2019-03-20 ENCOUNTER — Other Ambulatory Visit: Payer: Self-pay

## 2019-03-20 ENCOUNTER — Ambulatory Visit (AMBULATORY_SURGERY_CENTER): Payer: PRIVATE HEALTH INSURANCE | Admitting: Gastroenterology

## 2019-03-20 VITALS — BP 112/75 | HR 57 | Temp 98.5°F | Resp 15 | Ht 71.5 in | Wt 200.0 lb

## 2019-03-20 DIAGNOSIS — C183 Malignant neoplasm of hepatic flexure: Secondary | ICD-10-CM | POA: Diagnosis not present

## 2019-03-20 MED ORDER — SODIUM CHLORIDE 0.9 % IV SOLN: 500.0000 mL | Freq: Once | INTRAVENOUS | Status: DC

## 2019-03-20 NOTE — Telephone Encounter (Signed)
Dr.Jacobs notified, new orders for patient to drink small bottle of Miralax-119 gram  into Gatorade 32 oz before 2:15 pm today. Pt notified and he states he will do that. Explained not to use red or purple Gatorade.

## 2019-03-20 NOTE — Progress Notes (Signed)
Report given to PACU, vss 

## 2019-03-20 NOTE — Progress Notes (Signed)
Pt's states no medical or surgical changes since previsit or office visit. [place]

## 2019-03-20 NOTE — Patient Instructions (Signed)
YOU HAD AN ENDOSCOPIC PROCEDURE TODAY AT THE [facility]:   Refer to the procedure report that was given to you for any specific questions about what was found during the examination.  If the procedure report does not answer your questions, please call your gastroenterologist to clarify.  If you requested that your care partner not be given the details of your procedure findings, then the procedure report has been included in a sealed envelope for you to review at your convenience later.  YOU SHOULD EXPECT: Some feelings of bloating in the abdomen. Passage of more gas than usual.  Walking can help get rid of the air that was put into your GI tract during the procedure and reduce the bloating. If you had a lower endoscopy (such as a colonoscopy or flexible sigmoidoscopy) you may notice spotting of blood in your stool or on the toilet paper. If you underwent a bowel prep for your procedure, you may not have a normal bowel movement for a few days.  Please Note:  You might notice some irritation and congestion in your nose or some drainage.  This is from the oxygen used during your procedure.  There is no need for concern and it should clear up in a day or so.  SYMPTOMS TO REPORT IMMEDIATELY:   Following lower endoscopy (colonoscopy or flexible sigmoidoscopy):  Excessive amounts of blood in the stool  Significant tenderness or worsening of abdominal pains  Swelling of the abdomen that is new, acute  Fever of 100F or higher   For urgent or emergent issues, a gastroenterologist can be reached at any hour by calling [phone].   DIET:  We do recommend a small meal at first, but then you may proceed to your regular diet.  Drink plenty of fluids but you should avoid alcoholic beverages for 24 hours.  MEDICATIONS: Continue present medications.  ACTIVITY:  You should plan to take it easy for the rest of today and you should NOT DRIVE or use heavy machinery until tomorrow (because of  the sedation medicines used during the test).    FOLLOW UP: Our staff will call the number listed on your records 48-72 hours following your procedure to check on you and address any questions or concerns that you may have regarding the information given to you following your procedure. If we do not reach you, we will leave a message.  We will attempt to reach you two times.  During this call, we will ask if you have developed any symptoms of COVID 19. If you develop any symptoms (ie: fever, flu-like symptoms, shortness of breath, cough etc.) before then, please call [phone].  If you test positive for Covid 19 in the 2 weeks post procedure, please call and report this information to us.    If any biopsies were taken you will be contacted by phone or by letter within the next 1-3 weeks.  Please call us at [phone] if you have not heard about the biopsies in 3 weeks.   Thank you for allowing us to provide for your healthcare needs today.   SIGNATURES/CONFIDENTIALITY: You and/or your care partner have signed paperwork which will be entered into your electronic medical record.  These signatures attest to the fact that that the information above on your After Visit Summary has been reviewed and is understood.  Full responsibility of the confidentiality of this discharge information lies with you and/or your care-partner. 

## 2019-03-20 NOTE — Op Note (Signed)
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 Colonoscopy December 2019                            for routine risk screening found this polyp,                            removed with snare cautery. Pathology proved                            adenocarcinoma to the margin. Right hemicolectomy                            January 2020 showed no residual adenocarcinoma at                            the site. 9 lymph nodes were removed and they were                            all negative for cancer Medicines:                Monitored Anesthesia Care Procedure:                Pre-Anesthesia Assessment:                           - Prior to the procedure, a History and Physical                            was performed, and patient medications and                            allergies were reviewed. The patient's tolerance of                            previous anesthesia was also reviewed. The risks                            and benefits of the procedure and the sedation                            options and risks were discussed with the patient.  All questions were answered, and informed consent                            was obtained. Prior Anticoagulants: The patient has                            taken no previous anticoagulant or antiplatelet                            agents. ASA Grade Assessment: II - A patient with                            mild systemic disease. After reviewing the risks                            and benefits, the patient was deemed  in                            satisfactory condition to undergo the procedure.                           After obtaining informed consent, the colonoscope                            was passed under direct vision. Throughout the                            procedure, the patient's blood pressure, pulse, and                            oxygen saturations were monitored continuously. The                            Colonoscope was introduced through the anus and                            advanced to the the ileocolonic anastomosis. The                            colonoscopy was performed without difficulty. The                            patient tolerated the procedure well. The quality                            of the bowel preparation was good. The rectum was                            photographed. Scope In: 4:19:09 PM Scope Out: 4:26:36 PM Scope Withdrawal Time: 0 hours 5 minutes 47 seconds  Total Procedure Duration: 0 hours 7 minutes 27 seconds  Findings:                 Normal ileocolonic anastomosis (from 04/2018 right  hemicolectomy).                           The entire examined colon appeared normal on direct                            and retroflexion views. Complications:            No immediate complications. Estimated blood loss:                            None. Estimated Blood Loss:     Estimated blood loss: none. Impression:               - Normal ileocolonic anastomosis (from 04/2018 right                            hemicolectomy).                           - No polyps or cancers. Recommendation:           - Patient has a contact number available for                            emergencies. The signs and symptoms of potential                            delayed complications were discussed with the                            patient. Return to normal activities tomorrow.                            Written discharge instructions were provided to the                             patient.                           - Resume previous diet.                           - Continue present medications.                           - Repeat colonoscopy in 3 years for surveillance. [name], MD 03/20/2019 4:30:41 PM This report has been signed electronically.

## 2019-03-24 ENCOUNTER — Telehealth: Payer: Self-pay | Admitting: *Deleted

## 2019-03-24 NOTE — Telephone Encounter (Signed)
Message left

## 2019-04-09 ENCOUNTER — Encounter: Payer: Self-pay | Admitting: Family Medicine

## 2019-04-09 ENCOUNTER — Ambulatory Visit (INDEPENDENT_AMBULATORY_CARE_PROVIDER_SITE_OTHER): Payer: PRIVATE HEALTH INSURANCE | Admitting: Family Medicine

## 2019-04-09 ENCOUNTER — Other Ambulatory Visit: Payer: Self-pay

## 2019-04-09 VITALS — BP 110/70 | HR 73 | Ht 71.5 in | Wt 198.0 lb

## 2019-04-09 DIAGNOSIS — M5412 Radiculopathy, cervical region: Secondary | ICD-10-CM | POA: Diagnosis not present

## 2019-04-09 DIAGNOSIS — M999 Biomechanical lesion, unspecified: Secondary | ICD-10-CM | POA: Diagnosis not present

## 2019-04-09 NOTE — Assessment & Plan Note (Signed)
Decision today to treat with OMT was based on Physical Exam  After verbal consent patient was treated with HVLA, ME, FPR techniques in cervical, thoracic, lumbar and sacral areas  Patient tolerated the procedure well with improvement in symptoms  Patient given exercises, stretches and lifestyle modifications  See medications in patient instructions if given  Patient will follow up in 6-7 weeks

## 2019-04-09 NOTE — Progress Notes (Signed)
[facility] [address] [place] Phone: [phone] Subjective:   I Aaron Sharp am serving as a Education administrator for Dr. Hulan Saas.  This visit occurred during the SARS-CoV-2 public health emergency.  Safety protocols were in place, including screening questions prior to the visit, additional usage of staff PPE, and extensive cleaning of exam room while observing appropriate contact time as indicated for disinfecting solutions.   CC: Low back and neck pain follow-up  [id number]   02/25/2019 OMT  04/09/2019 Aaron Sharp is a 54 y.o. male coming in with complaint of back pain. Patient states he is doing better.  Patient has been doing relatively well over the course of time.  Patient feels like making progress as long as he does some of the exercises on a more regular basis.  Nothing that stops daily activity.     Past Medical History:  Diagnosis Date  . Bronchitis 04/17/2018   diagnosed with bronchitis and placed on Antibiotics  . Cancer ([place])    colcon cancer  . GIST (gastrointestinal stromal tumor), non-malignant   . Hiatal hernia    Past Surgical History:  Procedure Laterality Date  . COLONOSCOPY WITH PROPOFOL N/A 03/13/2018   Procedure: COLONOSCOPY WITH PROPOFOL;  Surgeon: [name], MD;  Location: [facility];  Service: Endoscopy;  Laterality: N/A;  . ESOPHAGOGASTRODUODENOSCOPY N/A 03/13/2018   Procedure: ESOPHAGOGASTRODUODENOSCOPY (EGD);  Surgeon: [name], MD;  Location: [facility];  Service: Endoscopy;  Laterality: N/A;  . EUS N/A 03/13/2018   Procedure: UPPER ENDOSCOPIC ULTRASOUND (EUS) RADIAL;  Surgeon: [name], MD;  Location: [facility];  Service: Endoscopy;  Laterality: N/A;  . FINE NEEDLE ASPIRATION N/A 03/13/2018   Procedure: FINE NEEDLE ASPIRATION (FNA) LINEAR;  Surgeon: [name], MD;  Location: [facility];  Service: Endoscopy;  Laterality: N/A;  . LAPAROSCOPIC PARTIAL  COLECTOMY N/A 05/01/2018   Procedure: LAPAROSCOPIC PARTIAL COLECTOMY;  Surgeon: Leighton Ruff, MD;  Location: [facility];  Service: General;  Laterality: N/A;  . POLYPECTOMY  03/13/2018   Procedure: POLYPECTOMY;  Surgeon: [name], MD;  Location: [facility];  Service: Endoscopy;;  . SHOULDER SURGERY    . WISDOM TOOTH EXTRACTION     Social History   Socioeconomic History  . Marital status: Married    Spouse name: Not on file  . Number of children: 2  . Years of education: Not on file  . Highest education level: Not on file  Occupational History  . Occupation: Data processing manager: [employer]  Tobacco Use  . Smoking status: Never Smoker  . Smokeless tobacco: Never Used  Substance and Sexual Activity  . Alcohol use: Yes  . Drug use: No  . Sexual activity: Yes  Other Topics Concern  . Not on file  Social History Narrative  . Not on file   Social Determinants of Health   Financial Resource Strain:   . Difficulty of Paying Living Expenses: Not on file  Food Insecurity:   . Worried About Charity fundraiser in the Last Year: Not on file  . Ran Out of Food in the Last Year: Not on file  Transportation Needs:   . Lack of Transportation (Medical): Not on file  . Lack of Transportation (Non-Medical): Not on file  Physical Activity:   . Days of Exercise per Week: Not on file  . Minutes of Exercise per Session: Not on file  Stress:   . Feeling  of Stress : Not on file  Social Connections:   . Frequency of Communication with Friends and Family: Not on file  . Frequency of Social Gatherings with Friends and Family: Not on file  . Attends Religious Services: Not on file  . Active Member of Clubs or Organizations: Not on file  . Attends Archivist Meetings: Not on file  . Marital Status: Not on file   No Known Allergies Family History  Problem Relation Age of Onset  . Depression Mother   . Colon cancer Paternal Grandfather   . Prostate cancer  Other   . Hypertension Daughter   . Breast cancer Paternal Aunt       Current Outpatient Medications (Cardiovascular):  .  cholestyramine (QUESTRAN) 4 g packet, Take 1 packet (4 g total) by mouth daily.     Current Outpatient Medications (Analgesics):  .  ibuprofen (ADVIL,MOTRIN) 200 MG tablet, Take 400 mg by mouth every 6 (six) hours as needed for moderate pain.     Current Outpatient Medications (Other):  Marland Kitchen  Cholecalciferol (VITAMIN D3) 50 MCG (2000 UT) capsule, Take 1 capsule (2,000 Units total) by mouth daily. .  meclizine (ANTIVERT) 12.5 MG tablet, Take 12.5 mg by mouth 3 (three) times daily as needed for dizziness.  Current Facility-Administered Medications (Other):  .  0.9 %  sodium chloride infusion    Past medical history, social, surgical and family history all reviewed in electronic medical record.  No pertanent information unless stated regarding to the chief complaint.   Review of Systems:  No headache, visual changes, nausea, vomiting, diarrhea, constipation, dizziness, abdominal pain, skin rash, fevers, chills, night sweats, weight loss, swollen lymph nodes, body aches, joint swelling, muscle aches, chest pain, shortness of breath, mood changes.   Objective  Blood pressure 110/70, pulse 73, height 5' 11.5" (1.816 m), weight 198 lb (89.8 kg), SpO2 98 %.    General: No apparent distress alert and oriented x3 mood and affect normal, dressed appropriately.  HEENT: Pupils equal, extraocular movements intact  Respiratory: Patient's speak in full sentences and does not appear short of breath  Cardiovascular: No lower extremity edema, non tender, no erythema  Skin: Warm dry intact with no signs of infection or rash on extremities or on axial skeleton.  Abdomen: Soft nontender  Neuro: Cranial nerves II through XII are intact, neurovascularly intact in all extremities with 2+ DTRs and 2+ pulses.  Lymph: No lymphadenopathy of posterior or anterior cervical chain or  axillae bilaterally.  Gait normal with good balance and coordination.  MSK:  tender with full range of motion and good stability and symmetric strength and tone of shoulders, elbows, wrist, hip, knee and ankles bilaterally.   Neck exam very mild loss of lordosis but does have full range of motion.  Negative Spurling's.  5 out of 5 strength of the upper extremities.  Mild parascapular tightness.  Osteopathic findings C3 flexed rotated and side bent left T2 extended rotated and side bent left with inhaled rib T7 extended rotated and side bent right   Impression and Recommendations:     This case required medical decision making of moderate complexity. The above documentation has been reviewed and is accurate and complete Lyndal Pulley, DO       Note: This dictation was prepared with Dragon dictation along with smaller phrase technology. Any transcriptional errors that result from this process are unintentional.

## 2019-04-09 NOTE — Assessment & Plan Note (Signed)
No longer having any radiculopathy.  Discussed with patient about icing regimen and home exercise program which activities to do which wants to avoid.  Patient is doing relatively well with conservative therapy and wants to continue with just the natural approach.  Patient will follow up with me again in 6 to 7 weeks.

## 2019-04-09 NOTE — Patient Instructions (Addendum)
Good to see you Continue exercises Keep hands within peripheral vision See me again in 7 weeks

## 2019-04-14 ENCOUNTER — Other Ambulatory Visit: Payer: Self-pay | Admitting: Family Medicine

## 2019-04-14 NOTE — Telephone Encounter (Signed)
Sent MyChart message to see if patient was still in need of medication.

## 2019-04-19 ENCOUNTER — Other Ambulatory Visit: Payer: Self-pay | Admitting: Family Medicine

## 2019-05-28 ENCOUNTER — Encounter: Payer: Self-pay | Admitting: Family Medicine

## 2019-05-28 ENCOUNTER — Other Ambulatory Visit: Payer: Self-pay

## 2019-05-28 ENCOUNTER — Ambulatory Visit (INDEPENDENT_AMBULATORY_CARE_PROVIDER_SITE_OTHER): Payer: Commercial Managed Care - PPO | Admitting: Family Medicine

## 2019-05-28 VITALS — BP 110/70 | HR 54 | Ht 71.5 in | Wt 198.0 lb

## 2019-05-28 DIAGNOSIS — M5412 Radiculopathy, cervical region: Secondary | ICD-10-CM | POA: Diagnosis not present

## 2019-05-28 DIAGNOSIS — M999 Biomechanical lesion, unspecified: Secondary | ICD-10-CM | POA: Diagnosis not present

## 2019-05-28 NOTE — Progress Notes (Signed)
Aaron Sharp [address] [place] Phone: [phone] Subjective:   I [name] am serving as a Education administrator for Dr. Hulan Saas.  This visit occurred during the SARS-CoV-2 public health emergency.  Safety protocols were in place, including screening questions prior to the visit, additional usage of staff PPE, and extensive cleaning of exam room while observing appropriate contact time as indicated for disinfecting solutions.   I'm seeing this patient by the request  of:  [name], [name], MD  CC: Neck pain with radiation follow-up  [id number]  Trintin Orleans is a 54 y.o. male coming in with complaint of back pain. Last seen on 04/09/2019 for OMT. Patient states his back always hurts and the pain doesn't change.  Patient states that the neck seems to be more tight.  Also having some increasing neck tightness as well.  Patient describes it as a dull, throbbing aching sensation.    Past Medical History:  Diagnosis Date  . Bronchitis 04/17/2018   diagnosed with bronchitis and placed on Antibiotics  . Cancer ([place])    colcon cancer  . GIST (gastrointestinal stromal tumor), non-malignant   . Hiatal hernia    Past Surgical History:  Procedure Laterality Date  . COLONOSCOPY WITH PROPOFOL N/A 03/13/2018   Procedure: COLONOSCOPY WITH PROPOFOL;  Surgeon: [name], MD;  Location: [facility];  Service: Endoscopy;  Laterality: N/A;  . ESOPHAGOGASTRODUODENOSCOPY N/A 03/13/2018   Procedure: ESOPHAGOGASTRODUODENOSCOPY (EGD);  Surgeon: [name], MD;  Location: [facility];  Service: Endoscopy;  Laterality: N/A;  . EUS N/A 03/13/2018   Procedure: UPPER ENDOSCOPIC ULTRASOUND (EUS) RADIAL;  Surgeon: [name], MD;  Location: [facility];  Service: Endoscopy;  Laterality: N/A;  . FINE NEEDLE ASPIRATION N/A 03/13/2018   Procedure: FINE NEEDLE ASPIRATION (FNA) LINEAR;  Surgeon: [name], MD;  Location: [facility];  Service: Endoscopy;  Laterality: N/A;  . LAPAROSCOPIC PARTIAL COLECTOMY N/A 05/01/2018   Procedure: LAPAROSCOPIC PARTIAL COLECTOMY;  Surgeon: Leighton Ruff, MD;  Location: [facility];  Service: General;  Laterality: N/A;  . POLYPECTOMY  03/13/2018   Procedure: POLYPECTOMY;  Surgeon: [name], MD;  Location: [facility];  Service: Endoscopy;;  . SHOULDER SURGERY    . WISDOM TOOTH EXTRACTION     Social History   Socioeconomic History  . Marital status: Married    Spouse name: Not on file  . Number of children: 2  . Years of education: Not on file  . Highest education level: Not on file  Occupational History  . Occupation: Data processing manager: [employer]  Tobacco Use  . Smoking status: Never Smoker  . Smokeless tobacco: Never Used  Substance and Sexual Activity  . Alcohol use: Yes  . Drug use: No  . Sexual activity: Yes  Other Topics Concern  . Not on file  Social History Narrative  . Not on file   Social Determinants of Health   Financial Resource Strain:   . Difficulty of Paying Living Expenses: Not on file  Food Insecurity:   . Worried About Charity fundraiser in the Last Year: Not on file  . Ran Out of Food in the Last Year: Not on file  Transportation Needs:   . Lack of Transportation (Medical): Not on file  . Lack of Transportation (Non-Medical): Not on file  Physical Activity:   . Days of Exercise per Week: Not on file  . Minutes of Exercise  per Session: Not on file  Stress:   . Feeling of Stress : Not on file  Social Connections:   . Frequency of Communication with Friends and Family: Not on file  . Frequency of Social Gatherings with Friends and Family: Not on file  . Attends Religious Services: Not on file  . Active Member of Clubs or Organizations: Not on file  . Attends Archivist Meetings: Not on file  . Marital Status: Not on file   No Known Allergies Family History  Problem Relation Age of Onset  .  Depression Mother   . Colon cancer Paternal Grandfather   . Prostate cancer Other   . Hypertension Daughter   . Breast cancer Paternal Aunt       Current Outpatient Medications (Cardiovascular):  .  cholestyramine (QUESTRAN) 4 g packet, Take 1 packet (4 g total) by mouth daily.     Current Outpatient Medications (Analgesics):  .  ibuprofen (ADVIL,MOTRIN) 200 MG tablet, Take 400 mg by mouth every 6 (six) hours as needed for moderate pain.     Current Outpatient Medications (Other):  Marland Kitchen  Cholecalciferol (VITAMIN D3) 50 MCG (2000 UT) capsule, Take 1 capsule (2,000 Units total) by mouth daily. .  meclizine (ANTIVERT) 12.5 MG tablet, Take 12.5 mg by mouth 3 (three) times daily as needed for dizziness. .  Vitamin D, Ergocalciferol, (DRISDOL) 1.25 MG (50000 UNIT) CAPS capsule, TAKE 1 CAPSULE (50,000 UNITS TOTAL) BY MOUTH EVERY 7 (SEVEN) DAYS.  Current Facility-Administered Medications (Other):  .  0.9 %  sodium chloride infusion   Reviewed prior external information including notes and imaging from  primary care provider As well as notes that were available from care everywhere and other healthcare systems.  Past medical history, social, surgical and family history all reviewed in electronic medical record.  No pertanent information unless stated regarding to the chief complaint.   Review of Systems:  No headache, visual changes, nausea, vomiting, diarrhea, constipation, dizziness, abdominal pain, skin rash, fevers, chills, night sweats, weight loss, swollen lymph nodes, body aches, joint swelling, chest pain, shortness of breath, mood changes. POSITIVE muscle aches  Objective  Blood pressure 110/70, pulse (!) 54, height 5' 11.5" (1.816 m), weight 198 lb (89.8 kg), SpO2 98 %.   General: No apparent distress alert and oriented x3 mood and affect normal, dressed appropriately.  HEENT: Pupils equal, extraocular movements intact  Respiratory: Patient's speak in full sentences and does  not appear short of breath  Cardiovascular: No lower extremity edema, non tender, no erythema  Skin: Warm dry intact with no signs of infection or rash on extremities or on axial skeleton.  Abdomen: Soft nontender  Neuro: Cranial nerves II through XII are intact, neurovascularly intact in all extremities with 2+ DTRs and 2+ pulses.  Lymph: No lymphadenopathy of posterior or anterior cervical chain or axillae bilaterally.  Gait normal with good balance and coordination.  MSK:  tender with range of motion and good stability and symmetric strength and tone of shoulders, elbows, wrist, hip, knee and ankles bilaterally.  Patient has loss of lordosis of the cervical spine.  Tightness noted in the parascapular region right greater than left.  Patient does have tightness in the thoracolumbar juncture right greater than left as well.  Mild tightness of the right sacroiliac joint with mild positive FABER test.  Negative straight leg test.  Near full range of motion though of all joints and axial skeleton  Osteopathic findings  C2 flexed rotated and side bent  right C7 flexed rotated and side bent left T3 extended rotated and side bent right inhaled third rib T11 extended rotated and side bent left L1 flexed rotated and side bent right Sacrum right on right    Impression and Recommendations:     This case required medical decision making of moderate complexity. The above documentation has been reviewed and is accurate and complete Lyndal Pulley, DO       Note: This dictation was prepared with Dragon dictation along with smaller phrase technology. Any transcriptional errors that result from this process are unintentional.

## 2019-05-28 NOTE — Patient Instructions (Addendum)
Good to see you Keep working on posture Aaron Sharp   See me again in 6-8 weeks

## 2019-05-28 NOTE — Assessment & Plan Note (Signed)
Decision today to treat with OMT was based on Physical Exam  After verbal consent patient was treated with HVLA, ME, FPR techniques in cervical, thoracic, rib,  lumbar and sacral areas  Patient tolerated the procedure well with improvement in symptoms  Patient given exercises, stretches and lifestyle modifications  See medications in patient instructions if given  Patient will follow up in 4-8 weeks 

## 2019-05-28 NOTE — Assessment & Plan Note (Signed)
Chronic problem with mild exacerbation.  Discussed posture and ergonomics, discussed which activities to do which wants to avoid.  Patient is to increase activity slowly over the course the next several weeks.  Follow-up again in 4 to 8 weeks.

## 2019-07-12 ENCOUNTER — Other Ambulatory Visit: Payer: Self-pay | Admitting: Family Medicine

## 2019-07-16 ENCOUNTER — Other Ambulatory Visit: Payer: Self-pay

## 2019-07-16 ENCOUNTER — Ambulatory Visit (INDEPENDENT_AMBULATORY_CARE_PROVIDER_SITE_OTHER): Payer: Commercial Managed Care - PPO | Admitting: Family Medicine

## 2019-07-16 ENCOUNTER — Encounter: Payer: Self-pay | Admitting: Family Medicine

## 2019-07-16 VITALS — BP 110/70 | HR 54 | Ht 71.5 in | Wt 197.0 lb

## 2019-07-16 DIAGNOSIS — M999 Biomechanical lesion, unspecified: Secondary | ICD-10-CM | POA: Diagnosis not present

## 2019-07-16 DIAGNOSIS — M5416 Radiculopathy, lumbar region: Secondary | ICD-10-CM | POA: Diagnosis not present

## 2019-07-16 NOTE — Patient Instructions (Signed)
See me again in 10 weeks Making improvements

## 2019-07-16 NOTE — Progress Notes (Signed)
[place] Phone: [phone] Subjective:   [name], am serving as a scribe for Dr. Hulan Saas. This visit occurred during the SARS-CoV-2 public health emergency.  Safety protocols were in place, including screening questions prior to the visit, additional usage of staff PPE, and extensive cleaning of exam room while observing appropriate contact time as indicated for disinfecting solutions.   I'm seeing this patient by the request  of:  [name], [name], MD  CC: Low back pain follow-up  [id number]  Advay Pilcher is a 54 y.o. male coming in with complaint of back pain. Last seen on 05/28/2019 for OMT. Patient states that he has not had any issues since last visit.  Patient states some mild tightness from time to time but nothing severe.  Has not needed any significant medications.  She does not remember even when who was last time      Past Medical History:  Diagnosis Date  . Bronchitis 04/17/2018   diagnosed with bronchitis and placed on Antibiotics  . 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 GIST (gastrointestinal stromal tumor), non-malignant   . Hiatal hernia    Past Surgical History:  Procedure Laterality Date  . COLONOSCOPY WITH PROPOFOL N/A 03/13/2018   Procedure: COLONOSCOPY WITH PROPOFOL;  Surgeon: [name], MD;  Location: [facility];  Service: Endoscopy;  Laterality: N/A;  . ESOPHAGOGASTRODUODENOSCOPY N/A 03/13/2018   Procedure: ESOPHAGOGASTRODUODENOSCOPY (EGD);  Surgeon: [name], MD;  Location: [facility];  Service: Endoscopy;  Laterality: N/A;  . EUS N/A 03/13/2018   Procedure: UPPER ENDOSCOPIC ULTRASOUND (EUS) RADIAL;  Surgeon: [name], MD;  Location: [facility];  Service: Endoscopy;  Laterality: N/A;  . FINE NEEDLE ASPIRATION N/A 03/13/2018   Procedure: FINE NEEDLE ASPIRATION (FNA) LINEAR;  Surgeon: [name], MD;  Location: [facility];   Service: Endoscopy;  Laterality: N/A;  . LAPAROSCOPIC PARTIAL COLECTOMY N/A 05/01/2018   Procedure: LAPAROSCOPIC PARTIAL COLECTOMY;  Surgeon: Leighton Ruff, MD;  Location: [facility];  Service: General;  Laterality: N/A;  . POLYPECTOMY  03/13/2018   Procedure: POLYPECTOMY;  Surgeon: [name], MD;  Location: [facility];  Service: Endoscopy;;  . SHOULDER SURGERY    . WISDOM TOOTH EXTRACTION     Social History   Socioeconomic History  . Marital status: Married    Spouse name: Not on file  . Number of children: 2  . Years of education: Not on file  . Highest education level: Not on file  Occupational History  . Occupation: Data processing manager: [employer]  Tobacco Use  . Smoking status: Never Smoker  . Smokeless tobacco: Never Used  Substance and Sexual Activity  . Alcohol use: Yes  . Drug use: No  . Sexual activity: Yes  Other Topics Concern  . Not on file  Social History Narrative  . Not on file   Social Determinants of Health   Financial Resource Strain:   . Difficulty of Paying Living Expenses:   Food Insecurity:   . Worried About Charity fundraiser in the Last Year:   . Arboriculturist in the Last Year:   Transportation Needs:   . Film/video editor (Medical):   Marland Kitchen Lack of Transportation (Non-Medical):   Physical Activity:   . Days of Exercise per Week:   . Minutes of Exercise per Session:   Stress:   . Feeling of  Stress :   Social Connections:   . Frequency of Communication with Friends and Family:   . Frequency of Social Gatherings with Friends and Family:   . Attends Religious Services:   . Active Member of Clubs or Organizations:   . Attends Archivist Meetings:   Marland Kitchen Marital Status:    No Known Allergies Family History  Problem Relation Age of Onset  . Depression Mother   . Colon cancer Paternal Grandfather   . Prostate cancer Other   . Hypertension Daughter   . Breast cancer Paternal Aunt       Current  Outpatient Medications (Cardiovascular):  .  cholestyramine (QUESTRAN) 4 g packet, Take 1 packet (4 g total) by mouth daily.     Current Outpatient Medications (Analgesics):  .  ibuprofen (ADVIL,MOTRIN) 200 MG tablet, Take 400 mg by mouth every 6 (six) hours as needed for moderate pain.     Current Outpatient Medications (Other):  Marland Kitchen  Cholecalciferol (VITAMIN D3) 50 MCG (2000 UT) capsule, Take 1 capsule (2,000 Units total) by mouth daily. .  meclizine (ANTIVERT) 12.5 MG tablet, Take 12.5 mg by mouth 3 (three) times daily as needed for dizziness. .  Vitamin D, Ergocalciferol, (DRISDOL) 1.25 MG (50000 UNIT) CAPS capsule, TAKE 1 CAPSULE (50,000 UNITS TOTAL) BY MOUTH EVERY 7 (SEVEN) DAYS.  Current Facility-Administered Medications (Other):  .  0.9 %  sodium chloride infusion   Reviewed prior external information including notes and imaging from  primary care provider As well as notes that were available from care everywhere and other healthcare systems.  Past medical history, social, surgical and family history all reviewed in electronic medical record.  No pertanent information unless stated regarding to the chief complaint.   Review of Systems:  No headache, visual changes, nausea, vomiting, diarrhea, constipation, dizziness, abdominal pain, skin rash, fevers, chills, night sweats, weight loss, swollen lymph nodes, body aches, joint swelling, chest pain, shortness of breath, mood changes. POSITIVE muscle aches  Objective  Blood pressure 110/70, pulse (!) 54, height 5' 11.5" (1.816 m), weight 197 lb (89.4 kg), SpO2 99 %.   General: No apparent distress alert and oriented x3 mood and affect normal, dressed appropriately.  HEENT: Pupils equal, extraocular movements intact  Respiratory: Patient's speak in full sentences and does not appear short of breath  Cardiovascular: No lower extremity edema, non tender, no erythema  Neuro: Cranial nerves II through XII are intact, neurovascularly  intact in all extremities with 2+ DTRs and 2+ pulses.  Gait normal with good balance and coordination.  MSK:  Non tender with full range of motion and good stability and symmetric strength and tone of shoulders, elbows, wrist, hip, knee and ankles bilaterally.  Low back exam does show some mild loss of lordosis.  Some tenderness to palpation in the paraspinal musculature lumbar spine left greater than right. Negative straight leg test.  Osteopathic findings  C6 flexed rotated and side bent left T3 extended rotated and side bent right inhaled third rib T7 extended rotated and side bent left L2 flexed rotated and side bent right Sacrum left on left    Impression and Recommendations:     This case required medical decision making of moderate complexity. The above documentation has been reviewed and is accurate and complete Lyndal Pulley, DO       Note: This dictation was prepared with Dragon dictation along with smaller phrase technology. Any transcriptional errors that result from this process are unintentional.

## 2019-07-16 NOTE — Assessment & Plan Note (Signed)
Doing well at the moment.  Discussed icing regimen and home exercise, which activities to do which wants to avoid.  Increase activity as tolerated.  Discussed icing regimen.  Follow-up again in  8 weeks

## 2019-07-16 NOTE — Assessment & Plan Note (Signed)

## 2019-10-11 ENCOUNTER — Other Ambulatory Visit: Payer: Self-pay | Admitting: Family Medicine

## 2020-01-10 ENCOUNTER — Other Ambulatory Visit: Payer: Self-pay | Admitting: Family Medicine

## 2020-01-14 ENCOUNTER — Encounter: Payer: PRIVATE HEALTH INSURANCE | Admitting: Internal Medicine

## 2020-02-08 NOTE — Progress Notes (Signed)
[facility] Phone: [phone] Subjective:   Aaron Sharp, am serving as a scribe for Dr. Hulan Saas. This visit occurred during the SARS-CoV-2 public health emergency.  Safety protocols were in place, including screening questions prior to the visit, additional usage of staff PPE, and extensive cleaning of exam room while observing appropriate contact time as indicated for disinfecting solutions.   I'm seeing this patient by the request  of:  [name], [name], MD  CC: back and neck pain.  New onset shoulder pain   [id number]  Aaron Sharp is a 54 y.o. male coming in with complaint of low back and left arm/bicep pain. Last seen on 07/16/2019 for OMT. Patient states that 2 weeks ago he feels like he pulled something in left shoulder. Patient having pain between scapula as well. Also is having tension in lower back for a few weeks.  Patient did do some lifting in his yard that he thinks exacerbated the lower back. Also having shoulder pain.  States that it comes and goes.  Certain movements cause a severe amount of pain.  Reaching across his body seems to be uncomfortable.  Thinks it may have been golf.  Denies any weakness.  With mild radiation.  Has had history of cervical radiculopathy but Sharp weakness of the upper extremity       Past Medical History:  Diagnosis Date  . Bronchitis 04/17/2018   diagnosed with bronchitis and placed on Antibiotics  . 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 GIST (gastrointestinal stromal tumor), non-malignant   . Hiatal hernia    Past Surgical History:  Procedure Laterality Date  . COLONOSCOPY WITH PROPOFOL N/A 03/13/2018   Procedure: COLONOSCOPY WITH PROPOFOL;  Surgeon: [name], MD;  [facility];  Service: Endoscopy;  Laterality: N/A;  . ESOPHAGOGASTRODUODENOSCOPY N/A 03/13/2018   Procedure: ESOPHAGOGASTRODUODENOSCOPY (EGD);  Surgeon: [name], MD;  Location: [facility];  Service: Endoscopy;  Laterality: N/A;  . EUS N/A 03/13/2018   Procedure: UPPER ENDOSCOPIC ULTRASOUND (EUS) RADIAL;  Surgeon: [name], MD;  [facility];  Service: Endoscopy;  Laterality: N/A;  . FINE NEEDLE ASPIRATION N/A 03/13/2018   Procedure: FINE NEEDLE ASPIRATION (FNA) LINEAR;  Surgeon: [name], MD;  [facility];  Service: Endoscopy;  Laterality: N/A;  . LAPAROSCOPIC PARTIAL COLECTOMY N/A 05/01/2018   Procedure: LAPAROSCOPIC PARTIAL COLECTOMY;  Surgeon: Leighton Ruff, MD;  Location: [facility];  Service: General;  Laterality: N/A;  . POLYPECTOMY  03/13/2018   Procedure: POLYPECTOMY;  Surgeon: [name], MD;  Location[facility];  Service: Endoscopy;;  . SHOULDER SURGERY    . WISDOM TOOTH EXTRACTION     Social History   Socioeconomic History  . Marital status: Married    Spouse name: Not on file  . Number of children: 2  . Years of education: Not on file  . Highest education level: Not on file  Occupational History  . Occupation: Data processing manager: [employer]  Tobacco Use  . Smoking status: Never Smoker  . Smokeless tobacco: Never Used  Vaping Use  . Vaping Use: Never used  Substance and Sexual Activity  . Alcohol use: Yes  . Drug use: Sharp  . Sexual activity: Yes  Other Topics Concern  . Not on file  Social History Narrative  . Not on file   Social Determinants of Health   Financial  Resource Strain:   . Difficulty of Paying Living Expenses: Not on file  Food Insecurity:   . Worried About Charity fundraiser in the Last Year: Not on file  . Ran Out of Food in the Last Year: Not on file  Transportation Needs:   . Lack of Transportation (Medical): Not on file  . Lack of Transportation (Non-Medical): Not on file  Physical Activity:   . Days of Exercise per Week: Not on file  . Minutes of Exercise per Session: Not on file  Stress:   . Feeling of Stress : Not on  file  Social Connections:   . Frequency of Communication with Friends and Family: Not on file  . Frequency of Social Gatherings with Friends and Family: Not on file  . Attends Religious Services: Not on file  . Active Member of Clubs or Organizations: Not on file  . Attends Archivist Meetings: Not on file  . Marital Status: Not on file   Sharp Known Allergies Family History  Problem Relation Age of Onset  . Depression Mother   . Colon cancer Paternal Grandfather   . Prostate cancer Other   . Hypertension Daughter   . Breast cancer Paternal Aunt     Current Outpatient Medications (Endocrine & Metabolic):  .  predniSONE (DELTASONE) 50 MG tablet, Take one tablet daily for the next 5 days.   Current Outpatient Medications (Cardiovascular):  .  cholestyramine (QUESTRAN) 4 g packet, Take 1 packet (4 g total) by mouth daily.     Current Outpatient Medications (Analgesics):  .  ibuprofen (ADVIL,MOTRIN) 200 MG tablet, Take 400 mg by mouth every 6 (six) hours as needed for moderate pain.     Current Outpatient Medications (Other):  Marland Kitchen  Cholecalciferol (VITAMIN D3) 50 MCG (2000 UT) capsule, Take 1 capsule (2,000 Units total) by mouth daily. .  meclizine (ANTIVERT) 12.5 MG tablet, Take 12.5 mg by mouth 3 (three) times daily as needed for dizziness. .  Vitamin D, Ergocalciferol, (DRISDOL) 1.25 MG (50000 UNIT) CAPS capsule, TAKE 1 CAPSULE (50,000 UNITS TOTAL) BY MOUTH EVERY 7 (SEVEN) DAYS. Marland Kitchen  gabapentin (NEURONTIN) 100 MG capsule, Take 2 capsules (200 mg total) by mouth at bedtime.  Current Facility-Administered Medications (Other):  .  0.9 %  sodium chloride infusion   Reviewed prior external information including notes and imaging from  primary care provider As well as notes that were available from care everywhere and other healthcare systems.  Past medical history, social, surgical and family history all reviewed in electronic medical record.  Sharp pertanent information  unless stated regarding to the chief complaint.   Review of Systems:  Sharp headache, visual changes, nausea, vomiting, diarrhea, constipation, dizziness, abdominal pain, skin rash, fevers, chills, night sweats, weight loss, swollen lymph nodes, joint swelling, chest pain, shortness of breath, mood changes. POSITIVE muscle aches, body aches  Objective  Blood pressure 124/88, pulse 80, height 5\' 11"  (1.803 m), weight 199 lb (90.3 kg), SpO2 98 %.   General: Sharp apparent distress alert and oriented x3 mood and affect normal, dressed appropriately.  HEENT: Pupils equal, extraocular movements intact  Respiratory: Patient's speak in full sentences and does not appear short of breath  Cardiovascular: Sharp lower extremity edema, non tender, Sharp erythema  Neuro: Cranial nerves II through XII are intact, neurovascularly intact in all extremities with 2+ DTRs and 2+ pulses.  Gait normal with good balance and coordination.  MSK:  Shoulder exam on the left shows some mild swelling  of the acromioclavicular joint.  Positive crossover.  Rotator cuff strength 5 out of 5 but does have some pain with empty can.  Mild positive impingement.  Negative speeds or Yergason's Back exam shows   Osteopathic findings C2 flexed rotated and side bent right C4 flexed rotated and side bent left C6 flexed rotated and side bent left T3 extended rotated and side bent right inhaled third rib T9 extended rotated and side bent left L2 flexed rotated and side bent right Sacrum right on right    Impression and Recommendations:    The above documentation has been reviewed and is accurate and complete Lyndal Pulley, DO

## 2020-02-09 ENCOUNTER — Encounter: Payer: Self-pay | Admitting: Family Medicine

## 2020-02-09 ENCOUNTER — Other Ambulatory Visit: Payer: Self-pay

## 2020-02-09 ENCOUNTER — Ambulatory Visit (INDEPENDENT_AMBULATORY_CARE_PROVIDER_SITE_OTHER): Payer: Commercial Managed Care - PPO | Admitting: Family Medicine

## 2020-02-09 ENCOUNTER — Ambulatory Visit (INDEPENDENT_AMBULATORY_CARE_PROVIDER_SITE_OTHER): Payer: Commercial Managed Care - PPO

## 2020-02-09 VITALS — BP 124/88 | HR 80 | Ht 71.0 in | Wt 199.0 lb

## 2020-02-09 DIAGNOSIS — M999 Biomechanical lesion, unspecified: Secondary | ICD-10-CM | POA: Diagnosis not present

## 2020-02-09 DIAGNOSIS — M25512 Pain in left shoulder: Secondary | ICD-10-CM | POA: Diagnosis not present

## 2020-02-09 DIAGNOSIS — M5412 Radiculopathy, cervical region: Secondary | ICD-10-CM | POA: Diagnosis not present

## 2020-02-09 DIAGNOSIS — M542 Cervicalgia: Secondary | ICD-10-CM

## 2020-02-09 MED ORDER — PREDNISONE 50 MG PO TABS
ORAL_TABLET | ORAL | 0 refills | Status: DC
Start: 2020-02-09 — End: 2020-05-09

## 2020-02-09 MED ORDER — GABAPENTIN 100 MG PO CAPS
200.0000 mg | ORAL_CAPSULE | Freq: Every day | ORAL | 0 refills | Status: DC
Start: 2020-02-09 — End: 2020-05-17

## 2020-02-09 NOTE — Patient Instructions (Signed)
Gabapentin at night Prednisone daily for 5 days AC joint exercises See me in 5-6 weeks

## 2020-02-09 NOTE — Assessment & Plan Note (Signed)
Has had pain in the neck previously.  Does have some tightness.  Prednisone given.  Warned of potential side effects.  Patient will be traveling.  Patient also given gabapentin.  Patient would like to continue that instead of Zanaflex secondary to headaches at the moment.  Patient will increase activity.  X-rays pending to further evaluate patient's x-ray from 2017.  Follow-up with me again in 4 to 5 weeks

## 2020-02-09 NOTE — Assessment & Plan Note (Signed)
Acute onset less than 4 weeks.  Likely an acromioclavicular type findings at the moment.  X-rays pending.  Topical anti-inflammatories, icing regimen, discussed home exercises.  Patient will make a difference changes in his ergonomics as well.  Follow-up we will consider the possibility of ultrasound and possible injections or formal physical therapy

## 2020-03-07 ENCOUNTER — Other Ambulatory Visit: Payer: Self-pay | Admitting: Internal Medicine

## 2020-03-07 DIAGNOSIS — R0683 Snoring: Secondary | ICD-10-CM

## 2020-03-11 NOTE — Progress Notes (Signed)
[place] [employer] [place] Phone: [phone] Subjective:   Aaron Sharp, am serving as a scribe for Dr. Hulan Saas. This visit occurred during the SARS-CoV-2 public health emergency.  Safety protocols were in place, including screening questions prior to the visit, additional usage of staff PPE, and extensive cleaning of exam room while observing appropriate contact time as indicated for disinfecting solutions.  I'm seeing this patient by the request  of:  [name], [name], MD  CC: Back pain follow-up  [id number]  Aaron Sharp is a 54 y.o. male coming in with complaint of left shoulder, back and neck pain. OMT 02/09/2020. Lower back pain has improved. Continues to have pain in left scapula. Pain with movement and at rest. Does feel 50% better. Has been working on stretching and using gabapentin. Also feels pain over deltoid tuberosity and tricep.   Medications patient has been prescribed: Gabapentin  Taking: Yes     Xray cervical 02/09/2020 IMPRESSION: Sharp explanation for patient's neck pain.  Xray left shoulder 02/09/2020 IMPRESSION: Mild degenerative change of the left glenohumeral and acromioclavicular joints.    Reviewed prior external information including notes and imaging from previsou exam, outside providers and external EMR if available.   As well as notes that were available from care everywhere and other healthcare systems.  Past medical history, social, surgical and family history all reviewed in electronic medical record.  Sharp pertanent information unless stated regarding to the chief complaint.   Past Medical History:  Diagnosis Date  . Bronchitis 04/17/2018   diagnosed with bronchitis and placed on Antibiotics  . 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 GIST (gastrointestinal stromal tumor), non-malignant   . Hiatal hernia     Sharp Known Allergies   Review of Systems:  Sharp headache, visual  changes, nausea, vomiting, diarrhea, constipation, dizziness, abdominal pain, skin rash, fevers, chills, night sweats, weight loss, swollen lymph nodes, body aches, joint swelling, chest pain, shortness of breath, mood changes. POSITIVE muscle aches  Objective  Blood pressure 108/76, pulse 69, height 5\' 11"  (1.803 m), weight 194 lb (88 kg), SpO2 98 %.   General: Sharp apparent distress alert and oriented x3 mood and affect normal, dressed appropriately.  HEENT: Pupils equal, extraocular movements intact  Respiratory: Patient's speak in full sentences and does not appear short of breath  Cardiovascular: Sharp lower extremity edema, non tender, Sharp erythema   Gait normal with good balance and coordination.  MSK:  Non tender with full range of motion and good stability and symmetric strength and tone of shoulders, elbows, wrist, hip, knee and ankles bilaterally.  Back -low back more tightness noted.  Right-sided greater than left.  Patient does have tightness with Corky Sox but very minor.  Patient actually has more pain in the thoracolumbar than the sacroiliac joint.  5-5 strength in lower extremities.  Neck exam mild loss of lordosis.  Negative Spurling's.  Patient's left shoulder still has very mild impingement noted  Osteopathic findings  C6 flexed rotated and side bent left T3 extended rotated and side bent left inhaled rib L1 flexed rotated and side bent right Sacrum right on right       Assessment and Plan:  Lumbar radiculopathy Patient does have some tightness.  Doing more manual labor around the house.  Discussed posture and balance.  Patient does make an improvement.  Discussed which activities to do which wants to avoid.  Follow-up again 4 to 6 weeks.  Continue the gabapentin    Nonallopathic problems  Decision today to treat with OMT was based on Physical Exam  After verbal consent patient was treated with HVLA, ME, FPR techniques in cervical, rib, thoracic, lumbar, and sacral   areas  Patient tolerated the procedure well with improvement in symptoms  Patient given exercises, stretches and lifestyle modifications  See medications in patient instructions if given  Patient will follow up in 4-8 weeks      The above documentation has been reviewed and is accurate and complete Aaron Pulley, DO       Note: This dictation was prepared with Dragon dictation along with smaller phrase technology. Any transcriptional errors that result from this process are unintentional.

## 2020-03-14 ENCOUNTER — Encounter: Payer: Self-pay | Admitting: Family Medicine

## 2020-03-14 ENCOUNTER — Other Ambulatory Visit: Payer: Self-pay

## 2020-03-14 ENCOUNTER — Ambulatory Visit (INDEPENDENT_AMBULATORY_CARE_PROVIDER_SITE_OTHER): Payer: Commercial Managed Care - PPO | Admitting: Family Medicine

## 2020-03-14 VITALS — BP 108/76 | HR 69 | Ht 71.0 in | Wt 194.0 lb

## 2020-03-14 DIAGNOSIS — M999 Biomechanical lesion, unspecified: Secondary | ICD-10-CM

## 2020-03-14 DIAGNOSIS — M5416 Radiculopathy, lumbar region: Secondary | ICD-10-CM

## 2020-03-14 NOTE — Patient Instructions (Signed)
See me again in 6 weeks Happy Holidays

## 2020-03-14 NOTE — Assessment & Plan Note (Addendum)
Patient does have some tightness.  Doing more manual labor around the house.  Discussed posture and balance.  Patient does make an improvement.  Discussed which activities to do which wants to avoid.  Follow-up again 4 to 6 weeks.  Continue the gabapentin

## 2020-04-22 NOTE — Progress Notes (Deleted)
  [place] Phone: [phone] Subjective:    I'm seeing this patient by the request  of:  [name], [name], MD  CC:   [id number]  Aaron Sharp is a 55 y.o. male coming in with complaint of back and neck pain. OMT 03/14/2020. Patient states   Medications patient has been prescribed: Gabapentin, Vit D, Prednisone  Taking:         Reviewed prior external information including notes and imaging from previsou exam, outside providers and external EMR if available.   As well as notes that were available from care everywhere and other healthcare systems.  Past medical history, social, surgical and family history all reviewed in electronic medical record.  No pertanent information unless stated regarding to the chief complaint.   Past Medical History:  Diagnosis Date  . Bronchitis 04/17/2018   diagnosed with bronchitis and placed on Antibiotics  . 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 GIST (gastrointestinal stromal tumor), non-malignant   . Hiatal hernia     No Known Allergies   Review of Systems:  No headache, visual changes, nausea, vomiting, diarrhea, constipation, dizziness, abdominal pain, skin rash, fevers, chills, night sweats, weight loss, swollen lymph nodes, body aches, joint swelling, chest pain, shortness of breath, mood changes. POSITIVE muscle aches  Objective  There were no vitals taken for this visit.   General: No apparent distress alert and oriented x3 mood and affect normal, dressed appropriately.  HEENT: Pupils equal, extraocular movements intact  Respiratory: Patient's speak in full sentences and does not appear short of breath  Cardiovascular: No lower extremity edema, non tender, no erythema  Neuro: Cranial nerves II through XII are intact, neurovascularly intact in all extremities with 2+ DTRs and 2+ pulses.  Gait normal with good balance and coordination.  MSK:  Non  tender with full range of motion and good stability and symmetric strength and tone of shoulders, elbows, wrist, hip, knee and ankles bilaterally.  Back - Normal skin, Spine with normal alignment and no deformity.  No tenderness to vertebral process palpation.  Paraspinous muscles are not tender and without spasm.   Range of motion is full at neck and lumbar sacral regions  Osteopathic findings  C2 flexed rotated and side bent right C6 flexed rotated and side bent left T3 extended rotated and side bent right inhaled rib T9 extended rotated and side bent left L2 flexed rotated and side bent right Sacrum right on right       Assessment and Plan:    Nonallopathic problems  Decision today to treat with OMT was based on Physical Exam  After verbal consent patient was treated with HVLA, ME, FPR techniques in cervical, rib, thoracic, lumbar, and sacral  areas  Patient tolerated the procedure well with improvement in symptoms  Patient given exercises, stretches and lifestyle modifications  See medications in patient instructions if given  Patient will follow up in 4-8 weeks      The above documentation has been reviewed and is accurate and complete Jacqualin Combes       Note: This dictation was prepared with Dragon dictation along with smaller phrase technology. Any transcriptional errors that result from this process are unintentional.

## 2020-04-25 ENCOUNTER — Ambulatory Visit: Payer: Commercial Managed Care - PPO | Admitting: Family Medicine

## 2020-04-27 ENCOUNTER — Other Ambulatory Visit: Payer: Self-pay | Admitting: Family Medicine

## 2020-04-27 ENCOUNTER — Encounter: Payer: Self-pay | Admitting: Family Medicine

## 2020-05-09 ENCOUNTER — Other Ambulatory Visit: Payer: Self-pay

## 2020-05-09 ENCOUNTER — Ambulatory Visit (INDEPENDENT_AMBULATORY_CARE_PROVIDER_SITE_OTHER): Payer: Commercial Managed Care - PPO

## 2020-05-09 ENCOUNTER — Ambulatory Visit (INDEPENDENT_AMBULATORY_CARE_PROVIDER_SITE_OTHER): Payer: Commercial Managed Care - PPO | Admitting: Family Medicine

## 2020-05-09 ENCOUNTER — Encounter: Payer: Self-pay | Admitting: Family Medicine

## 2020-05-09 VITALS — BP 120/80 | HR 60 | Ht 71.0 in | Wt 193.0 lb

## 2020-05-09 DIAGNOSIS — M999 Biomechanical lesion, unspecified: Secondary | ICD-10-CM

## 2020-05-09 DIAGNOSIS — M5412 Radiculopathy, cervical region: Secondary | ICD-10-CM | POA: Diagnosis not present

## 2020-05-09 DIAGNOSIS — M25512 Pain in left shoulder: Secondary | ICD-10-CM

## 2020-05-09 DIAGNOSIS — M5416 Radiculopathy, lumbar region: Secondary | ICD-10-CM

## 2020-05-09 NOTE — Progress Notes (Signed)
[name] [place] Phone: [phone] Subjective:   Aaron Sharp, am serving as a scribe for Dr. Hulan Saas.  This visit occurred during the SARS-CoV-2 public health emergency.  Safety protocols were in place, including screening questions prior to the visit, additional usage of staff PPE, and extensive cleaning of exam room while observing appropriate contact time as indicated for disinfecting solutions.   I'm seeing this patient by the request  of:  [name], [name], MD  CC: Neck and back pain follow-up  [id number]  Aaron Sharp is a 55 y.o. male coming in with complaint of back and neck pain. OMT 03/14/2020. Had COVID since last visit and had to reschedule. Patient states that he is feeling better overall   Medications patient has been prescribed: Gabapentin, Vit D  Taking: Gabapentin, Vit D         Reviewed prior external information including notes and imaging from previsou exam, outside providers and external EMR if available.   As well as notes that were available from care everywhere and other healthcare systems.  Past medical history, social, surgical and family history all reviewed in electronic medical record.  No pertanent information unless stated regarding to the chief complaint.   Past Medical History:  Diagnosis Date  . Bronchitis 04/17/2018   diagnosed with bronchitis and placed on Antibiotics  . Cancer ([place])    colcon cancer  . GIST (gastrointestinal stromal tumor), non-malignant   . Hiatal hernia     No Known Allergies   Review of Systems:  No headache, visual changes, nausea, vomiting, diarrhea, constipation, dizziness, abdominal pain, skin rash, fevers, chills, night sweats, weight loss, swollen lymph nodes, body aches, joint swelling, chest pain, shortness of breath, mood changes. POSITIVE muscle aches  Objective  Blood pressure 120/80, pulse 60, height 5\' 11"   (1.803 m), weight 193 lb (87.5 kg), SpO2 98 %.   General: No apparent distress alert and oriented x3 mood and affect normal, dressed appropriately.  HEENT: Pupils equal, extraocular movements intact  Respiratory: Patient's speak in full sentences and does not appear short of breath  Cardiovascular: No lower extremity edema, non tender, no erythema  Gait normal with good balance and coordination.  MSK:  Non tender with full range of motion and good stability and symmetric strength and tone of shoulders, elbows, wrist, hip, knee and ankles bilaterally.  Back -low back exam does have some mild loss of lordosis.  Tightness noted with FABER test very mild right greater than left.  Negative straight leg test.  Tightness seems to be more in the thoracolumbar juncture as well as the right sacroiliac joint.  5-5 strength in lower extremities.  Neck exam mild loss of lordosis.  Tightness with side bending more than usual.  Negative Spurling's though.  Mild impingement still noted with patient's left shoulder patient actually has more pain in the humerus and near the elbow at this point.  No significant masses appreciated though.  Osteopathic findings  C6 flexed rotated and side bent left T3 extended rotated and side bent right inhaled rib T9 extended rotated and side bent left L3 flexed rotated and side bent right Sacrum right on right       Assessment and Plan:  Cervical radiculopathy Patient is still having some mild signs and symptoms that could be secondary to radiculopathy.  Patient is on gabapentin.  Patient has a negative Spurling's but continues to have arm pain.  We will  get x-rays with patient having a history of the colon cancer.  Depending on findings we may want to consider a nerve conduction study at follow-up.  Patient is in agreement with the plan.  Left shoulder pain Shoulder seems better but will get erythematous x-ray as well as a left elbow.  We will continue to monitor.   Patient did have the history of dementia there is no metastasis.  Lumbar radiculopathy Continue mild lumbar radiculopathy as well.  On gabapentin.  We discussed with patient's history we should consider the possibility of advanced imaging.  Patient will consider this but does not feel like it would change medical management at this time.  Increase activity slowly.  Follow-up again in 4 to 6 weeks.  Worsening pain consider MRI of the lumbar spine and CT abdomen pelvis with history of colon cancer.    Nonallopathic problems  Decision today to treat with OMT was based on Physical Exam  After verbal consent patient was treated with HVLA, ME, FPR techniques in cervical, rib, thoracic, lumbar, and sacral  areas  Patient tolerated the procedure well with improvement in symptoms  Patient given exercises, stretches and lifestyle modifications  See medications in patient instructions if given  Patient will follow up in 4-8 weeks      The above documentation has been reviewed and is accurate and complete Lyndal Pulley, DO       Note: This dictation was prepared with Dragon dictation along with smaller phrase technology. Any transcriptional errors that result from this process are unintentional.

## 2020-05-09 NOTE — Assessment & Plan Note (Signed)
Shoulder seems better but will get erythematous x-ray as well as a left elbow.  We will continue to monitor.  Patient did have the history of dementia there is no metastasis.

## 2020-05-09 NOTE — Patient Instructions (Addendum)
Good to see you  Back, humerus and elbow pain Watch stomach pain and discuss with GI and Primary Care Provider IF still in pain we will consider MRI lumbar and CT abdomen/pelvis See me again in 6-8 weeks

## 2020-05-09 NOTE — Assessment & Plan Note (Signed)
Continue mild lumbar radiculopathy as well.  On gabapentin.  We discussed with patient's history we should consider the possibility of advanced imaging.  Patient will consider this but does not feel like it would change medical management at this time.  Increase activity slowly.  Follow-up again in 4 to 6 weeks.  Worsening pain consider MRI of the lumbar spine and CT abdomen pelvis with history of colon cancer.

## 2020-05-09 NOTE — Assessment & Plan Note (Signed)
Patient is still having some mild signs and symptoms that could be secondary to radiculopathy.  Patient is on gabapentin.  Patient has a negative Spurling's but continues to have arm pain.  We will get x-rays with patient having a history of the colon cancer.  Depending on findings we may want to consider a nerve conduction study at follow-up.  Patient is in agreement with the plan.

## 2020-05-16 ENCOUNTER — Other Ambulatory Visit: Payer: Self-pay | Admitting: Family Medicine

## 2020-05-18 ENCOUNTER — Encounter (INDEPENDENT_AMBULATORY_CARE_PROVIDER_SITE_OTHER): Payer: Self-pay

## 2020-06-17 NOTE — Progress Notes (Signed)
Aaron Sharp Phone: [phone] Subjective:   Aaron Sharp, am serving as a scribe for Dr. Hulan Sharp. This visit occurred during the SARS-CoV-2 public health emergency.  Safety protocols were in place, including screening questions prior to the visit, additional usage of staff PPE, and extensive cleaning of exam room while observing appropriate contact time as indicated for disinfecting solutions.   I'm seeing this patient by the request  of:  Aaron Sharp, Aaron Lacks, MD  CC: Neck pain and low back pain follow-up  TIW:[id number]  Aaron Sharp is a 55 y.o. male coming in with complaint of back and neck pain. OMT 05/17/2020. Patient states that he continues to have pain in the lower back with squatting and flexion of lumbar spine.  Patient states that otherwise been doing relatively well.  Nothing that stops him from activity.  Medications patient has been prescribed: Gabapentin, Vit D  Taking:  Patient within the last year has had cervical and lumbar x-rays that were unremarkable.       Reviewed prior external information including notes and imaging from previsou exam, outside providers and external EMR if available.   As well as notes that were available from care everywhere and other healthcare systems.  Past medical history, social, surgical and family history all reviewed in electronic medical record.  Sharp pertanent information unless stated regarding to the chief complaint.   Past Medical History:  Diagnosis Date  . Bronchitis 04/17/2018   diagnosed with bronchitis and placed on Antibiotics  . Cancer ([place])    colcon cancer  . GIST (gastrointestinal stromal tumor), non-malignant   . Hiatal hernia     Sharp Known Allergies   Review of Systems:  Sharp headache, visual changes, nausea, vomiting, diarrhea, constipation, dizziness, abdominal pain, skin rash, fevers, chills, night sweats, weight loss,  swollen lymph nodes, body aches, joint swelling, chest pain, shortness of breath, mood changes. POSITIVE muscle aches  Objective  Blood pressure 120/70, pulse 70, height 5\' 11"  (1.803 m), weight 194 lb (88 kg), SpO2 99 %.   General: Sharp apparent distress alert and oriented x3 mood and affect normal, dressed appropriately.  HEENT: Pupils equal, extraocular movements intact  Respiratory: Patient's speak in full sentences and does not appear short of breath  Cardiovascular: Sharp lower extremity edema, non tender, Sharp erythema .  Gait normal with good balance and coordination.  MSK:  Non tender with full range of motion and good stability and symmetric strength and tone of shoulders, elbows, wrist, hip, knee and ankles bilaterally.  Back -back exam does have some mild loss of lordosis.  Patient is still tender to palpation a little bit over the sacroiliac joint.  Patient's neck also some very mild tightness noted in the paraspinal musculature right greater than left.  Negative for radicular symptoms are noted today.  Mild crepitus noted.  Osteopathic findings  C2 flexed rotated and side bent right T3 extended rotated and side bent right inhaled rib T6 extended rotated and side bent left L2 flexed rotated and side bent right Sacrum right on right       Assessment and Plan:  Lumbar radiculopathy Low back doing relatively well at this time.  We discussed with patient at great length about home exercises and icing regimen.  Patient has had some mild arthritic changes but nothing that is stopping him from activity.  Patient encouraged to continue to monitor for any other symptoms of the radicular symptoms.  Patient is stable.  Follow-up with me again 4 to 8 weeks    Nonallopathic problems  Decision today to treat with OMT was based on Physical Exam  After verbal consent patient was treated with HVLA, ME, FPR techniques in cervical, rib, thoracic, lumbar, and sacral  areas  Patient tolerated  the procedure well with improvement in symptoms  Patient given exercises, stretches and lifestyle modifications  See medications in patient instructions if given  Patient will follow up in 4-8 weeks      The above documentation has been reviewed and is accurate and complete Aaron Pulley, DO       Note: This dictation was prepared with Dragon dictation along with smaller phrase technology. Any transcriptional errors that result from this process are unintentional.

## 2020-06-20 ENCOUNTER — Other Ambulatory Visit: Payer: Self-pay

## 2020-06-20 ENCOUNTER — Encounter: Payer: Self-pay | Admitting: Family Medicine

## 2020-06-20 ENCOUNTER — Ambulatory Visit (INDEPENDENT_AMBULATORY_CARE_PROVIDER_SITE_OTHER): Payer: Commercial Managed Care - PPO | Admitting: Family Medicine

## 2020-06-20 VITALS — BP 120/70 | HR 70 | Ht 71.0 in | Wt 194.0 lb

## 2020-06-20 DIAGNOSIS — M999 Biomechanical lesion, unspecified: Secondary | ICD-10-CM

## 2020-06-20 DIAGNOSIS — M5416 Radiculopathy, lumbar region: Secondary | ICD-10-CM | POA: Diagnosis not present

## 2020-06-20 NOTE — Assessment & Plan Note (Signed)
Low back doing relatively well at this time.  We discussed with patient at great length about home exercises and icing regimen.  Patient has had some mild arthritic changes but nothing that is stopping him from activity.  Patient encouraged to continue to monitor for any other symptoms of the radicular symptoms.  Patient is stable.  Follow-up with me again 4 to 8 weeks

## 2020-06-20 NOTE — Patient Instructions (Signed)
Upper back is making progress See me again in 6-8 weeks

## 2020-07-08 ENCOUNTER — Encounter: Payer: Self-pay | Admitting: Family Medicine

## 2020-07-11 ENCOUNTER — Other Ambulatory Visit: Payer: Self-pay

## 2020-07-11 ENCOUNTER — Ambulatory Visit (INDEPENDENT_AMBULATORY_CARE_PROVIDER_SITE_OTHER): Payer: Commercial Managed Care - PPO

## 2020-07-11 DIAGNOSIS — M79672 Pain in left foot: Secondary | ICD-10-CM

## 2020-07-14 ENCOUNTER — Other Ambulatory Visit: Payer: Self-pay

## 2020-07-14 ENCOUNTER — Encounter: Payer: Self-pay | Admitting: Internal Medicine

## 2020-07-14 ENCOUNTER — Ambulatory Visit (INDEPENDENT_AMBULATORY_CARE_PROVIDER_SITE_OTHER): Payer: Commercial Managed Care - PPO | Admitting: Internal Medicine

## 2020-07-14 VITALS — BP 120/82 | HR 53 | Temp 98.0°F | Ht 71.0 in | Wt 193.2 lb

## 2020-07-14 DIAGNOSIS — Z0000 Encounter for general adult medical examination without abnormal findings: Secondary | ICD-10-CM

## 2020-07-14 DIAGNOSIS — C183 Malignant neoplasm of hepatic flexure: Secondary | ICD-10-CM | POA: Diagnosis not present

## 2020-07-14 DIAGNOSIS — R972 Elevated prostate specific antigen [PSA]: Secondary | ICD-10-CM | POA: Diagnosis not present

## 2020-07-14 DIAGNOSIS — G4733 Obstructive sleep apnea (adult) (pediatric): Secondary | ICD-10-CM

## 2020-07-14 DIAGNOSIS — Z23 Encounter for immunization: Secondary | ICD-10-CM

## 2020-07-14 DIAGNOSIS — R42 Dizziness and giddiness: Secondary | ICD-10-CM

## 2020-07-14 NOTE — Assessment & Plan Note (Addendum)
S/p lap R colectomy on 05/01/18 - Dr Joyice Faster Dr Ardis Hughs - last colon 2021 due in 2024 Obtain CEA, obtain vitamin B12 level

## 2020-07-14 NOTE — Assessment & Plan Note (Signed)
Benign Positional Vertigo symptoms Start Meclizine. Start Brandt - Daroff exercise several times a day as dirrected.  

## 2020-07-14 NOTE — Assessment & Plan Note (Signed)
PSA

## 2020-07-14 NOTE — Progress Notes (Signed)
Subjective:  Patient ID: Aaron Sharp, male    DOB: 11-27-1965  Age: 55 y.o. MRN: [id number]  CC: Annual Exam   HPI Aaron Sharp presents for a well exam  Outpatient Medications Prior to Visit  Medication Sig Dispense Refill  . Cholecalciferol (VITAMIN D3) 50 MCG (2000 UT) capsule Take 1 capsule (2,000 Units total) by mouth daily. 100 capsule 3  . gabapentin (NEURONTIN) 100 MG capsule TAKE 2 CAPSULES BY MOUTH AT BEDTIME. 180 capsule 0  . meclizine (ANTIVERT) 12.5 MG tablet Take 12.5 mg by mouth 3 (three) times daily as needed for dizziness.    . Vitamin D, Ergocalciferol, (DRISDOL) 1.25 MG (50000 UNIT) CAPS capsule TAKE 1 CAPSULE (50,000 UNITS TOTAL) BY MOUTH EVERY 7 (SEVEN) DAYS (Patient not taking: Reported on 07/14/2020) 12 capsule 0  . 0.9 %  sodium chloride infusion      No facility-administered medications prior to visit.    ROS: Review of Systems  Constitutional: Negative for appetite change, fatigue and unexpected weight change.  HENT: Negative for congestion, nosebleeds, sneezing, sore throat and trouble swallowing.   Eyes: Negative for itching and visual disturbance.  Respiratory: Negative for cough.   Cardiovascular: Negative for chest pain, palpitations and leg swelling.  Gastrointestinal: Negative for abdominal distention, blood in stool, diarrhea and nausea.  Genitourinary: Negative for frequency and hematuria.  Musculoskeletal: Negative for back pain, gait problem, joint swelling and neck pain.  Skin: Negative for rash.  Neurological: Positive for dizziness. Negative for tremors, speech difficulty and weakness.  Psychiatric/Behavioral: Negative for agitation, dysphoric mood and sleep disturbance. The patient is not nervous/anxious.     Objective:  BP 120/82 (BP Location: Left Arm)   Pulse (!) 53   Temp 98 F (36.7 C) (Oral)   Ht 5\' 11"  (1.803 m)   Wt 193 lb 3.2 oz (87.6 kg)   SpO2 98%   BMI 26.95 kg/m   BP Readings from Last 3 Encounters:   07/14/20 120/82  06/20/20 120/70  05/09/20 120/80    Wt Readings from Last 3 Encounters:  07/14/20 193 lb 3.2 oz (87.6 kg)  06/20/20 194 lb (88 kg)  05/09/20 193 lb (87.5 kg)    Physical Exam Constitutional:      General: He is not in acute distress.    Appearance: He is well-developed.     Comments: NAD  Eyes:     Conjunctiva/sclera: Conjunctivae normal.     Pupils: Pupils are equal, round, and reactive to light.  Neck:     Thyroid: No thyromegaly.     Vascular: No JVD.  Cardiovascular:     Rate and Rhythm: Normal rate and regular rhythm.     Heart sounds: Normal heart sounds. No murmur heard. No friction rub. No gallop.   Pulmonary:     Effort: Pulmonary effort is normal. No respiratory distress.     Breath sounds: Normal breath sounds. No wheezing or rales.  Chest:     Chest wall: No tenderness.  Abdominal:     General: Bowel sounds are normal. There is no distension.     Palpations: Abdomen is soft. There is no mass.     Tenderness: There is no abdominal tenderness. There is no guarding or rebound.  Musculoskeletal:        General: No tenderness. Normal range of motion.     Cervical back: Normal range of motion.  Lymphadenopathy:     Cervical: No cervical adenopathy.  Skin:    General: Skin is  warm and dry.     Findings: No rash.  Neurological:     Mental Status: He is alert and oriented to person, place, and time.     Cranial Nerves: No cranial nerve deficit.     Motor: No abnormal muscle tone.     Coordination: Coordination normal.     Gait: Gait normal.     Deep Tendon Reflexes: Reflexes are normal and symmetric.  Psychiatric:        Behavior: Behavior normal.        Thought Content: Thought content normal.        Judgment: Judgment normal.    rectal - per Dr Ardis Hughs  Lab Results  Component Value Date   WBC 6.0 01/08/2019   HGB 15.6 01/08/2019   HCT 45.5 01/08/2019   PLT 210.0 01/08/2019   GLUCOSE 83 01/08/2019   CHOL 222 (H) 01/08/2019   TRIG  304.0 (H) 01/08/2019   HDL 41.00 01/08/2019   LDLDIRECT 124.0 01/08/2019   LDLCALC 109 (H) 01/09/2018   ALT 22 01/08/2019   AST 21 01/08/2019   NA 140 01/08/2019   K 4.4 01/08/2019   CL 105 01/08/2019   CREATININE 1.17 01/08/2019   BUN 15 01/08/2019   CO2 26 01/08/2019   TSH 2.33 01/08/2019   PSA 2.19 01/08/2019   INR 1.09 11/17/2009    MM DIAG BREAST TOMO BILATERAL  Result Date: 05/26/2018 CLINICAL DATA:  55 year old male with recent right breast tenderness/focal soreness and lump predominately in the 8-11 o'clock region. Recent surgery for colon cancer. Denies taking any medication. EXAM: DIGITAL DIAGNOSTIC BILATERAL MAMMOGRAM WITH CAD AND TOMO COMPARISON:  None ACR Breast Density Category b: There are scattered areas of fibroglandular density. FINDINGS: There is moderate right gynecomastia predominantly in the retroareolar and outer right breast. There is no mass, suspicious microcalcification, or architectural distortion. There is minimal retroareolar left gynecomastia. No findings for suspicious for malignancy on the left. Axillary regions are unremarkable. On physical exam I palpate spongy fullness in the retroareolar and outer periareolar right breast. No suspicious mass is palpated. Mammographic images were processed with CAD. IMPRESSION: Moderate right gynecomastia. Minimal left gynecomastia. No evidence of malignancy in either breast. RECOMMENDATION: No further imaging follow-up is recommended unless new or progressive areas of concern arise. I have discussed the findings and recommendations with the patient. Results were also provided in writing at the conclusion of the visit. If applicable, a reminder letter will be sent to the patient regarding the next appointment. BI-RADS CATEGORY  2: Benign. Electronically Signed   By: Curlene Dolphin M.D.   On: 05/26/2018 13:54    Assessment & Plan:    Walker Kehr, MD

## 2020-07-14 NOTE — Assessment & Plan Note (Addendum)
Sleep test is scheduled and pending

## 2020-07-14 NOTE — Assessment & Plan Note (Signed)

## 2020-07-19 ENCOUNTER — Other Ambulatory Visit (INDEPENDENT_AMBULATORY_CARE_PROVIDER_SITE_OTHER): Payer: Commercial Managed Care - PPO

## 2020-07-19 ENCOUNTER — Other Ambulatory Visit: Payer: Self-pay

## 2020-07-19 DIAGNOSIS — Z0000 Encounter for general adult medical examination without abnormal findings: Secondary | ICD-10-CM | POA: Diagnosis not present

## 2020-07-19 DIAGNOSIS — R972 Elevated prostate specific antigen [PSA]: Secondary | ICD-10-CM | POA: Diagnosis not present

## 2020-07-19 DIAGNOSIS — C183 Malignant neoplasm of hepatic flexure: Secondary | ICD-10-CM | POA: Diagnosis not present

## 2020-07-19 LAB — COMPREHENSIVE METABOLIC PANEL
ALT: 20 U/L (ref 0–53)
AST: 19 U/L (ref 0–37)
Albumin: 4.2 g/dL (ref 3.5–5.2)
Alkaline Phosphatase: 66 U/L (ref 39–117)
BUN: 16 mg/dL (ref 6–23)
CO2: 27 mEq/L (ref 19–32)
Calcium: 9.4 mg/dL (ref 8.4–10.5)
Chloride: 103 mEq/L (ref 96–112)
Creatinine, Ser: 1.06 mg/dL (ref 0.40–1.50)
GFR: 80.62 mL/min (ref >60.00)
Glucose, Bld: 74 mg/dL (ref 70–99)
Potassium: 4.2 mEq/L (ref 3.5–5.1)
Sodium: 138 mEq/L (ref 135–145)
Total Bilirubin: 0.7 mg/dL (ref 0.2–1.2)
Total Protein: 7.4 g/dL (ref 6.0–8.3)

## 2020-07-19 LAB — CBC WITH DIFFERENTIAL/PLATELET
Basophils Absolute: 0 10*3/uL (ref 0.0–0.1)
Basophils Relative: 0.6 % (ref 0.0–3.0)
Eosinophils Absolute: 0.1 10*3/uL (ref 0.0–0.7)
Eosinophils Relative: 1.8 % (ref 0.0–5.0)
HCT: 41.8 % (ref 39.0–52.0)
Hemoglobin: 14.4 g/dL (ref 13.0–17.0)
Lymphocytes Relative: 32.9 % (ref 12.0–46.0)
Lymphs Abs: 1.8 10*3/uL (ref 0.7–4.0)
MCHC: 34.4 g/dL (ref 30.0–36.0)
MCV: 92 fl (ref 78.0–100.0)
Monocytes Absolute: 0.5 10*3/uL (ref 0.1–1.0)
Monocytes Relative: 9.3 % (ref 3.0–12.0)
Neutro Abs: 3.1 10*3/uL (ref 1.4–7.7)
Neutrophils Relative %: 55.4 % (ref 43.0–77.0)
Platelets: 195 10*3/uL (ref 150.0–400.0)
RBC: 4.54 Mil/uL (ref 4.22–5.81)
RDW: 12.3 % (ref 11.5–15.5)
WBC: 5.6 10*3/uL (ref 4.0–10.5)

## 2020-07-19 LAB — URINALYSIS
Bilirubin Urine: NEGATIVE
Hgb urine dipstick: NEGATIVE
Ketones, ur: NEGATIVE
Leukocytes,Ua: NEGATIVE
Nitrite: NEGATIVE
Specific Gravity, Urine: 1.025 (ref 1.000–1.030)
Total Protein, Urine: NEGATIVE
Urine Glucose: NEGATIVE
Urobilinogen, UA: 0.2 (ref 0.0–1.0)
pH: 5.5 (ref 5.0–8.0)

## 2020-07-19 LAB — TSH: TSH: 2.76 u[IU]/mL (ref 0.35–4.50)

## 2020-07-19 LAB — LDL CHOLESTEROL, DIRECT: Direct LDL: 113 mg/dL

## 2020-07-19 LAB — LIPID PANEL
Cholesterol: 183 mg/dL (ref 0–200)
HDL: 40.9 mg/dL (ref >39.00)
NonHDL: 141.75
Total CHOL/HDL Ratio: 4
Triglycerides: 203 mg/dL — ABNORMAL HIGH (ref 0.0–149.0)
VLDL: 40.6 mg/dL — ABNORMAL HIGH (ref 0.0–40.0)

## 2020-07-19 LAB — VITAMIN B12: Vitamin B-12: 298 pg/mL (ref 211–911)

## 2020-07-19 LAB — PSA: PSA: 2.09 ng/mL (ref 0.10–4.00)

## 2020-07-20 ENCOUNTER — Other Ambulatory Visit: Payer: Self-pay | Admitting: Internal Medicine

## 2020-07-20 MED ORDER — B COMPLEX VITAMINS PO CAPS: 1.0000 | ORAL_CAPSULE | Freq: Every day | ORAL | 3 refills | Status: AC

## 2020-08-08 NOTE — Progress Notes (Signed)
[facility] [address] [place] Phone: [phone] Subjective:   I Aaron Sharp am serving as a Education administrator for Dr. Hulan Saas.  This visit occurred during the SARS-CoV-2 public health emergency.  Safety protocols were in place, including screening questions prior to the visit, additional usage of staff PPE, and extensive cleaning of exam room while observing appropriate contact time as indicated for disinfecting solutions.   I'm seeing this patient by the request  of:  [name], [name], MD  CC: Back and neck pain follow-up  [id number]  Aaron Sharp is a 55 y.o. male coming in with complaint of back and neck pain. OMT 06/20/2020. Patient states he is a little sore today but overall doing well. Stiffness in the neck recently. Improvements in the lower back.  Denies any fevers, chills, any abnormal weight loss.  Continuing to stay fairly active.  States that the gabapentin is helpful but is wondering how long he needs to take it.  Medications patient has been prescribed: None           Reviewed prior external information including notes and imaging from previsou exam, outside providers and external EMR if available.   As well as notes that were available from care everywhere and other healthcare systems.  Past medical history, social, surgical and family history all reviewed in electronic medical record.  No pertanent information unless stated regarding to the chief complaint.   Past Medical History:  Diagnosis Date  . Bronchitis 04/17/2018   diagnosed with bronchitis and placed on Antibiotics  . Cancer ([place])    colcon cancer  . GIST (gastrointestinal stromal tumor), non-malignant   . Hiatal hernia     No Known Allergies   Review of Systems:  No headache, visual changes, nausea, vomiting, diarrhea, constipation, dizziness, abdominal pain, skin rash, fevers, chills, night sweats, weight loss, swollen lymph  nodes, body aches, joint swelling, chest pain, shortness of breath, mood changes. POSITIVE muscle aches  Objective  Blood pressure 130/78, pulse 69, height 5\' 11"  (1.803 m), weight 195 lb (88.5 kg), SpO2 99 %.   General: No apparent distress alert and oriented x3 mood and affect normal, dressed appropriately.  HEENT: Pupils equal, extraocular movements intact  Respiratory: Patient's speak in full sentences and does not appear short of breath  Cardiovascular: No lower extremity edema, non tender, no erythema  Gait normal with good balance and coordination.  MSK:  Non tender with full range of motion and good stability and symmetric strength and tone of shoulders, elbows, wrist, hip, knee and ankles bilaterally.  Back -  Osteopathic findings  C6 flexed rotated and side bent left T3 extended rotated and side bent right inhaled rib L5 flexed rotated and side bent left Sacrum right on right       Assessment and Plan:  Cervical radiculopathy Luckily no raadiculopathy.  On gabapentin will try to titrate down.  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  Was responding somewhat to the gabapentin.  Discussed posture and ergonomics.  Responded well to manipulation.  No change in management except patient will consider to try to titrate down on the gabapentin.  Follow-up with me again in 6 weeks.  Lumbar radiculopathy Likely no radiculopathy.  Patient does have the history of colon cancer.  X-rays were unremarkable.  We have discussed the potential for MRI.  Patient wants to hold still at this time.  Follow-up again in 6 weeks    Nonallopathic problems  Decision today to treat with OMT was based on Physical Exam  After verbal consent patient was treated with HVLA, ME, FPR techniques in cervical, rib, thoracic, lumbar, and sacral  areas  Patient tolerated the procedure well  with improvement in symptoms  Patient given exercises, stretches and lifestyle modifications  See medications in patient instructions if given  Patient will follow up in 4-8 weeks      The above documentation has been reviewed and is accurate and complete Lyndal Pulley, DO       Note: This dictation was prepared with Dragon dictation along with smaller phrase technology. Any transcriptional errors that result from this process are unintentional.

## 2020-08-09 ENCOUNTER — Ambulatory Visit (INDEPENDENT_AMBULATORY_CARE_PROVIDER_SITE_OTHER): Payer: Commercial Managed Care - PPO | Admitting: Family Medicine

## 2020-08-09 ENCOUNTER — Other Ambulatory Visit: Payer: Self-pay

## 2020-08-09 ENCOUNTER — Encounter: Payer: Self-pay | Admitting: Family Medicine

## 2020-08-09 VITALS — BP 130/78 | HR 69 | Ht 71.0 in | Wt 195.0 lb

## 2020-08-09 DIAGNOSIS — M5416 Radiculopathy, lumbar region: Secondary | ICD-10-CM

## 2020-08-09 DIAGNOSIS — M9904 Segmental and somatic dysfunction of sacral region: Secondary | ICD-10-CM

## 2020-08-09 DIAGNOSIS — M9908 Segmental and somatic dysfunction of rib cage: Secondary | ICD-10-CM

## 2020-08-09 DIAGNOSIS — M9903 Segmental and somatic dysfunction of lumbar region: Secondary | ICD-10-CM

## 2020-08-09 DIAGNOSIS — M9901 Segmental and somatic dysfunction of cervical region: Secondary | ICD-10-CM

## 2020-08-09 DIAGNOSIS — M9902 Segmental and somatic dysfunction of thoracic region: Secondary | ICD-10-CM

## 2020-08-09 DIAGNOSIS — M940 Chondrocostal junction syndrome [Tietze]: Secondary | ICD-10-CM

## 2020-08-09 DIAGNOSIS — M5412 Radiculopathy, cervical region: Secondary | ICD-10-CM

## 2020-08-09 NOTE — Assessment & Plan Note (Signed)
Luckily no raadiculopathy.  On gabapentin will try to titrate down.  Discussed HEP, posture noted  RTC in 6 weeks

## 2020-08-09 NOTE — Patient Instructions (Signed)
Good to see you Lower back might be sensitive for a few days 1 pill of gabapentin for a week see how you feel if worsen go to 2  See me again in 6-8 weeks

## 2020-08-09 NOTE — Assessment & Plan Note (Signed)
Patient has had difficulty for quite some time.  Chronic problem with exacerbation.  Was responding somewhat to the gabapentin.  Discussed posture and ergonomics.  Responded well to manipulation.  No change in management except patient will consider to try to titrate down on the gabapentin.  Follow-up with me again in 6 weeks.

## 2020-08-09 NOTE — Assessment & Plan Note (Signed)
Likely no radiculopathy.  Patient does have the history of colon cancer.  X-rays were unremarkable.  We have discussed the potential for MRI.  Patient wants to hold still at this time.  Follow-up again in 6 weeks

## 2020-08-16 ENCOUNTER — Other Ambulatory Visit: Payer: Self-pay

## 2020-08-16 ENCOUNTER — Encounter: Payer: Self-pay | Admitting: Pulmonary Disease

## 2020-08-16 ENCOUNTER — Ambulatory Visit (INDEPENDENT_AMBULATORY_CARE_PROVIDER_SITE_OTHER): Payer: Commercial Managed Care - PPO | Admitting: Pulmonary Disease

## 2020-08-16 ENCOUNTER — Other Ambulatory Visit: Payer: Self-pay | Admitting: Family Medicine

## 2020-08-16 VITALS — BP 110/70 | HR 55 | Temp 97.7°F | Ht 71.0 in | Wt 195.2 lb

## 2020-08-16 DIAGNOSIS — G4733 Obstructive sleep apnea (adult) (pediatric): Secondary | ICD-10-CM | POA: Diagnosis not present

## 2020-08-16 NOTE — Progress Notes (Signed)
Subjective:    Patient ID: Aaron Sharp, male    DOB: [birth year], 55 y.o.   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 His wife has witnessed apneas and gasping episodes during his sleep. Epworth sleepiness score is 8 and reports mild sleepiness while as a passenger in a car, watching TV, sitting and reading. Bedtime is around 10 PM, sleep latency is minimal, sleeps on his side with 1 pillow, reports 2-3 nocturnal awakenings including nocturia.  He keeps water by his bedside, out of bed at 6:45 AM with dryness of mouth but denies headaches. Occasional palpitations when he wakes up in the morning He has gained 8 pounds in the last 2 years  There is no history suggestive of cataplexy, sleep paralysis or parasomnias   PMH -colon surgery 04/2018 for tumor   Past Medical History:  Diagnosis Date  . Bronchitis 04/17/2018   diagnosed with bronchitis and placed on Antibiotics  . 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 GIST (gastrointestinal stromal tumor), non-malignant   . Hiatal hernia    Past Surgical History:  Procedure Laterality Date  . COLONOSCOPY WITH PROPOFOL N/A 03/13/2018   Procedure: COLONOSCOPY WITH PROPOFOL;  Surgeon: [name], MD;  Location: [facility];  Service: Endoscopy;  Laterality: N/A;  . ESOPHAGOGASTRODUODENOSCOPY N/A 03/13/2018   Procedure: ESOPHAGOGASTRODUODENOSCOPY (EGD);  Surgeon: [name], MD;  Location: [facility];  Service: Endoscopy;  Laterality: N/A;  . EUS N/A 03/13/2018   Procedure: UPPER ENDOSCOPIC ULTRASOUND (EUS) RADIAL;  Surgeon: [name], MD;  Location: [facility];  Service: Endoscopy;  Laterality: N/A;  . FINE NEEDLE ASPIRATION N/A 03/13/2018   Procedure: FINE NEEDLE ASPIRATION (FNA) LINEAR;  Surgeon: [name], MD;  Location: [facility];  Service: Endoscopy;  Laterality: N/A;  . LAPAROSCOPIC PARTIAL COLECTOMY N/A  05/01/2018   Procedure: LAPAROSCOPIC PARTIAL COLECTOMY;  Surgeon: Leighton Ruff, MD;  Location: [facility];  Service: General;  Laterality: N/A;  . POLYPECTOMY  03/13/2018   Procedure: POLYPECTOMY;  Surgeon: [name], MD;  Location: [facility];  Service: Endoscopy;;  . SHOULDER SURGERY    . WISDOM TOOTH EXTRACTION      No Known Allergies  Social History   Socioeconomic History  . Marital status: Married    Spouse name: Not on file  . Number of children: 2  . Years of education: Not on file  . Highest education level: Not on file  Occupational History  . Occupation: Data processing manager: [employer]  Tobacco Use  . Smoking status: Never Smoker  . Smokeless tobacco: Never Used  Vaping Use  . Vaping Use: Never used  Substance and Sexual Activity  . Alcohol use: Yes  . Drug use: No  . Sexual activity: Yes  Other Topics Concern  . Not on file  Social History Narrative  . Not on file   Social Determinants of Health   Financial Resource Strain: Not on file  Food Insecurity: Not on file  Transportation Needs: Not on file  Physical Activity: Not on file  Stress: Not on file  Social Connections: Not on file  Intimate Partner Violence: Not on file    Family History  Problem Relation Age of Onset  . Depression Mother   . Colon cancer Paternal Grandfather   . Prostate cancer Other   . Hypertension Daughter   . Breast cancer Paternal Aunt  Review of Systems Constitutional: negative for anorexia, fevers and sweats  Eyes: negative for irritation, redness and visual disturbance  Ears, nose, mouth, throat, and face: negative for earaches, epistaxis, nasal congestion and sore throat  Respiratory: negative for cough, dyspnea on exertion, sputum and wheezing  Cardiovascular: negative for chest pain, dyspnea, lower extremity edema, orthopnea, palpitations and syncope  Gastrointestinal: negative for abdominal pain, constipation, diarrhea, melena,  nausea and vomiting  Genitourinary:negative for dysuria, frequency and hematuria  Hematologic/lymphatic: negative for bleeding, easy bruising and lymphadenopathy  Musculoskeletal:negative for arthralgias, muscle weakness and stiff joints  Neurological: negative for coordination problems, gait problems, headaches and weakness  Endocrine: negative for diabetic symptoms including polydipsia, polyuria and weight loss     Objective:   Physical Exam  Gen. Pleasant, well-nourished, in no distress, normal affect ENT - no pallor,icterus, no post nasal drip, no overbite, class II airway Neck: No JVD, no thyromegaly, no carotid bruits Lungs: no use of accessory muscles, no dullness to percussion, clear without rales or rhonchi  Cardiovascular: Rhythm regular, heart sounds  normal, no murmurs or gallops, no peripheral edema Abdomen: soft and non-tender, no hepatosplenomegaly, BS normal. Musculoskeletal: No deformities, no cyanosis or clubbing Neuro:  alert, non focal       Assessment & Plan:

## 2020-08-16 NOTE — Patient Instructions (Signed)
Schedule home sleep test 

## 2020-08-16 NOTE — Assessment & Plan Note (Signed)
Given excessive daytime somnolence, narrow pharyngeal exam, witnessed apneas & loud snoring, obstructive sleep apnea is possible & an overnight polysomnogram will be scheduled as a home study. The pathophysiology of obstructive sleep apnea , it's cardiovascular consequences & modes of treatment including CPAP were discused with the patient in detail & they evidenced understanding.  Pretest probability is lowto intermediate, he would be willing to use CPAP if needed

## 2020-08-22 ENCOUNTER — Other Ambulatory Visit: Payer: Self-pay | Admitting: Family Medicine

## 2020-09-14 ENCOUNTER — Ambulatory Visit: Payer: Commercial Managed Care - PPO

## 2020-09-14 ENCOUNTER — Other Ambulatory Visit: Payer: Self-pay

## 2020-09-14 DIAGNOSIS — G4733 Obstructive sleep apnea (adult) (pediatric): Secondary | ICD-10-CM | POA: Diagnosis not present

## 2020-09-16 ENCOUNTER — Telehealth: Payer: Self-pay | Admitting: Pulmonary Disease

## 2020-09-16 DIAGNOSIS — G4733 Obstructive sleep apnea (adult) (pediatric): Secondary | ICD-10-CM | POA: Diagnosis not present

## 2020-09-16 NOTE — Telephone Encounter (Signed)
Spoke with pt and notified of results per Dr. Elsworth Soho. Pt verbalized understanding and denied any questions.  Pt agreeable to CPAP and is aware of back order may cause delay. Pt notified to call for 31-90 day rov once he gets the machine.

## 2020-09-16 NOTE — Telephone Encounter (Signed)
HST showed moderate  OSA with AHI 25/ hr, Esp worse when sleeping o n his back  Suggest autoCPAP  5-15 cm, mask of choice OV with me/APP in 6 wks after starting

## 2020-09-26 NOTE — Progress Notes (Signed)
Aaron Sharp [address] [place] Phone: [phone] Subjective:   I Aaron Sharp am serving as a Education administrator for Dr. Hulan Sharp.  This visit occurred during the SARS-CoV-2 public health emergency.  Safety protocols were in place, including screening questions prior to the visit, additional usage of staff PPE, and extensive cleaning of exam room while observing appropriate contact time as indicated for disinfecting solutions.   I'm seeing this patient by the request  of:  [name], Aaron Lacks, MD  CC: Low back pain follow-up  NTI:[id number]  Aaron Sharp is a 55 y.o. male coming in with complaint of back and neck pain. OMT 08/09/2020. Patient states he is feeling better. Was able to rest during the weekend.  Patient states that the rest has been helpful.  Patient states that as long as he does some of the exercises seems to do well.  He does have the gabapentin.  Would like a refill of the vitamin D which patient feels like he has done better as well.  Patient denies any numbness or tingling at the moment.  There are some tightness at the end of long days.  Medications patient has been prescribed: Gabapentin  Taking:         Reviewed prior external information including notes and imaging from previsou exam, outside providers and external EMR if available.   As well as notes that were available from care everywhere and other healthcare systems.  Past medical history, social, surgical and family history all reviewed in electronic medical record.  No pertanent information unless stated regarding to the chief complaint.   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 POSITIVE muscle aches but very mild  Objective  Blood pressure 120/82, pulse 63, height 5\' 11"  (1.803 m), weight 196 lb (88.9 kg), SpO2 99 %.   General: No apparent distress alert and oriented x3 mood and affect normal, dressed appropriately.  HEENT: Pupils equal, extraocular movements intact  Respiratory: Patient's speak in full sentences and does not appear short of breath  Cardiovascular: No lower extremity edema, non tender, no erythema  Patient's neck exam still has tightness.  Crepitus noted.  Patient has negative Spurling's.  5 out of 5 strength of the upper extremities  Low back exam still has tightness in the thoracolumbar junction as well as at the lumbosacral area.  Negative straight leg test at the moment though.  Osteopathic findings  C2 flexed rotated and side bent right T6 extended rotated and side bent right inhaled rib L2 flexed rotated and side bent right Sacrum right on right       Assessment and Plan:  Lumbar radiculopathy Stable at the moment.  Doing better overall.  Discussed the gabapentin again.  We discussed continuing to increase activity.  Patient is doing better and can follow-up in 2 to 3 months.   Nonallopathic problems  Decision today to treat with OMT was based on Physical Exam  After verbal consent patient was treated with HVLA, ME, FPR techniques in  cervical, rib, thoracic, lumbar, and sacral  areas  Patient tolerated the procedure well with improvement in symptoms  Patient given exercises, stretches and lifestyle modifications  See medications in patient instructions if given  Patient will follow up in 8-12 weeks      The above documentation has been reviewed and is accurate and complete Aaron Pulley, DO        Note:  This dictation was prepared with Dragon dictation along with smaller phrase technology. Any transcriptional errors that result from this process are unintentional.

## 2020-09-27 ENCOUNTER — Ambulatory Visit: Payer: Commercial Managed Care - PPO | Admitting: Family Medicine

## 2020-09-27 ENCOUNTER — Other Ambulatory Visit: Payer: Self-pay

## 2020-09-27 ENCOUNTER — Encounter: Payer: Self-pay | Admitting: Family Medicine

## 2020-09-27 VITALS — BP 120/82 | HR 63 | Ht 71.0 in | Wt 196.0 lb

## 2020-09-27 DIAGNOSIS — M9903 Segmental and somatic dysfunction of lumbar region: Secondary | ICD-10-CM

## 2020-09-27 DIAGNOSIS — M9908 Segmental and somatic dysfunction of rib cage: Secondary | ICD-10-CM

## 2020-09-27 DIAGNOSIS — M9901 Segmental and somatic dysfunction of cervical region: Secondary | ICD-10-CM

## 2020-09-27 DIAGNOSIS — M9904 Segmental and somatic dysfunction of sacral region: Secondary | ICD-10-CM

## 2020-09-27 DIAGNOSIS — M5416 Radiculopathy, lumbar region: Secondary | ICD-10-CM

## 2020-09-27 DIAGNOSIS — M9902 Segmental and somatic dysfunction of thoracic region: Secondary | ICD-10-CM

## 2020-09-27 MED ORDER — VITAMIN D (ERGOCALCIFEROL) 1.25 MG (50000 UNIT) PO CAPS: 50000.0000 [IU] | ORAL_CAPSULE | ORAL | 0 refills | Status: DC

## 2020-09-27 NOTE — Patient Instructions (Addendum)
Good to see you Making improvement keep it up See me again in 2-3 months

## 2020-09-27 NOTE — Assessment & Plan Note (Signed)
Stable at the moment.  Doing better overall.  Discussed the gabapentin again.  We discussed continuing to increase activity.  Patient is doing better and can follow-up in 2 to 3 months.

## 2020-10-13 ENCOUNTER — Other Ambulatory Visit: Payer: Self-pay

## 2020-10-13 ENCOUNTER — Ambulatory Visit (INDEPENDENT_AMBULATORY_CARE_PROVIDER_SITE_OTHER): Payer: Commercial Managed Care - PPO

## 2020-10-13 DIAGNOSIS — Z23 Encounter for immunization: Secondary | ICD-10-CM

## 2020-10-13 NOTE — Progress Notes (Addendum)
Pt was given 2nd Zoster vaccine w/o any complications. Medical screening examination/treatment/procedure(s) were performed by non-physician practitioner and as supervising physician I was immediately available for consultation/collaboration.  I agree with above. Lew Dawes, MD

## 2020-11-15 ENCOUNTER — Encounter: Payer: Self-pay | Admitting: Family Medicine

## 2020-11-15 ENCOUNTER — Other Ambulatory Visit: Payer: Self-pay

## 2020-11-15 MED ORDER — GABAPENTIN 100 MG PO CAPS: 200.0000 mg | ORAL_CAPSULE | Freq: Every day | ORAL | 0 refills | Status: DC

## 2020-11-15 MED ORDER — MECLIZINE HCL 12.5 MG PO TABS: 12.5000 mg | ORAL_TABLET | Freq: Three times a day (TID) | ORAL | 2 refills | Status: DC | PRN

## 2020-11-30 NOTE — Progress Notes (Signed)
[place] Phone: [phone] Subjective:   Aaron Sharp, am serving as a scribe for Dr. Hulan Saas.  This visit occurred during the SARS-CoV-2 public health emergency.  Safety protocols were in place, including screening questions prior to the visit, additional usage of staff PPE, and extensive cleaning of exam room while observing appropriate contact time as indicated for disinfecting solutions.   I'm seeing this patient by the request  of:  [name], [name], MD  CC: Neck and back pain follow-up  [id number]  Aaron Sharp is a 55 y.o. male coming in with complaint of back and neck pain> OMT 09/27/2020. Patient states that he has been doing well since last visit.  Patient has had some tightness of the back but nothing severe.  Patient has started increasing more of a tightness.  Patient has been doing a lot more repetitive activity  Medications patient has been prescribed: Gabapentin  Taking: Intermittently         Reviewed prior external information including notes and imaging from previsou exam, outside providers and external EMR if available.   As well as notes that were available from care everywhere and other healthcare systems.  Past medical history, social, surgical and family history all reviewed in electronic medical record.  Sharp pertanent information unless stated regarding to the chief complaint.   Past Medical History:  Diagnosis Date   Bronchitis 04/17/2018   diagnosed with bronchitis and placed on Antibiotics   Cancer ([place])    colcon cancer   GIST (gastrointestinal stromal tumor), non-malignant    Hiatal hernia     Sharp Known Allergies   Review of Systems:  Sharp headache, visual changes, nausea, vomiting, diarrhea, constipation, dizziness, abdominal pain, skin rash, fevers, chills, night sweats, weight loss, swollen lymph nodes, body aches, joint swelling, chest pain,  shortness of breath, mood changes. POSITIVE muscle aches  Objective  Blood pressure 110/70, pulse (!) 59, height '5\' 11"'$  (1.803 m), weight 193 lb (87.5 kg), SpO2 99 %.   General: Sharp apparent distress alert and oriented x3 mood and affect normal, dressed appropriately.  HEENT: Pupils equal, extraocular movements intact  Respiratory: Patient's speak in full sentences and does not appear short of breath  Cardiovascular: Sharp lower extremity edema, non tender, Sharp erythema  Low back exam does have some mild loss of lordosis.  Some tenderness to palpation in the paraspinal musculature.  Patient does have tightness more on the right side of the back than the left.  Mild tightness with FABER test but negative straight leg test.  Osteopathic findings  C2 flexed rotated and side bent right C7 flexed rotated and side bent left T3 extended rotated and side bent right inhaled rib T9 extended rotated and side bent left L2 flexed rotated and side bent right Sacrum right on right       Assessment and Plan:  Lumbar radiculopathy Patient likely is not having any radicular symptoms at this point.  Discussed with patient icing regimen and home exercises.  Increase activity slowly.  Follow-up again in  8 weeks   Nonallopathic problems  Decision today to treat with OMT was based on Physical Exam  After verbal consent patient was treated with HVLA, ME, FPR techniques in cervical, rib, thoracic, lumbar, and sacral  areas  Patient tolerated the procedure well with improvement in symptoms  Patient given exercises, stretches and lifestyle modifications  See medications in patient instructions if given  Patient  will follow up in 4-8 weeks      The above documentation has been reviewed and is accurate and complete Aaron Pulley, DO        Note: This dictation was prepared with Dragon dictation along with smaller phrase technology. Any transcriptional errors that result from this process are  unintentional.

## 2020-12-01 ENCOUNTER — Other Ambulatory Visit: Payer: Self-pay

## 2020-12-01 ENCOUNTER — Ambulatory Visit (INDEPENDENT_AMBULATORY_CARE_PROVIDER_SITE_OTHER): Payer: Commercial Managed Care - PPO | Admitting: Family Medicine

## 2020-12-01 VITALS — BP 110/70 | HR 59 | Ht 71.0 in | Wt 193.0 lb

## 2020-12-01 DIAGNOSIS — M5416 Radiculopathy, lumbar region: Secondary | ICD-10-CM | POA: Diagnosis not present

## 2020-12-01 DIAGNOSIS — M9903 Segmental and somatic dysfunction of lumbar region: Secondary | ICD-10-CM | POA: Diagnosis not present

## 2020-12-01 DIAGNOSIS — M9904 Segmental and somatic dysfunction of sacral region: Secondary | ICD-10-CM

## 2020-12-01 DIAGNOSIS — M9908 Segmental and somatic dysfunction of rib cage: Secondary | ICD-10-CM

## 2020-12-01 DIAGNOSIS — M9901 Segmental and somatic dysfunction of cervical region: Secondary | ICD-10-CM

## 2020-12-01 DIAGNOSIS — M9902 Segmental and somatic dysfunction of thoracic region: Secondary | ICD-10-CM | POA: Diagnosis not present

## 2020-12-01 NOTE — Patient Instructions (Signed)
[place] will be great  See me again in 8 weeks You are doing great keep it up!

## 2020-12-01 NOTE — Assessment & Plan Note (Signed)
Patient likely is not having any radicular symptoms at this point.  Discussed with patient icing regimen and home exercises.  Increase activity slowly.  Follow-up again in  8 weeks

## 2020-12-28 ENCOUNTER — Other Ambulatory Visit: Payer: Self-pay | Admitting: Family Medicine

## 2021-01-25 ENCOUNTER — Telehealth: Payer: Self-pay

## 2021-01-25 NOTE — Telephone Encounter (Signed)
Per recall the pt needs EUS with DJ in December 2022

## 2021-01-25 NOTE — Telephone Encounter (Signed)
Tried to reach pt and mail box full.  Will try later

## 2021-01-26 ENCOUNTER — Ambulatory Visit: Payer: Commercial Managed Care - PPO | Admitting: Family Medicine

## 2021-01-26 NOTE — Telephone Encounter (Signed)
No answer and mail box full will mail letter to the pt and send to My Chart

## 2021-03-14 ENCOUNTER — Other Ambulatory Visit: Payer: Self-pay | Admitting: Family Medicine

## 2021-03-15 MED ORDER — GABAPENTIN 100 MG PO CAPS: 200.0000 mg | ORAL_CAPSULE | Freq: Every day | ORAL | 0 refills | Status: DC

## 2021-03-30 ENCOUNTER — Other Ambulatory Visit: Payer: Self-pay

## 2021-03-30 DIAGNOSIS — K3189 Other diseases of stomach and duodenum: Secondary | ICD-10-CM

## 2021-05-10 ENCOUNTER — Encounter (HOSPITAL_COMMUNITY): Payer: Self-pay | Admitting: Gastroenterology

## 2021-05-10 NOTE — Progress Notes (Signed)
Attempted to obtain medical history via telephone, unable to reach at this time. I left a voicemail to return pre surgical testing department's phone call.  

## 2021-05-17 NOTE — Anesthesia Preprocedure Evaluation (Addendum)
Anesthesia Evaluation  Patient identified by MRN, date of birth, ID band Patient awake    Reviewed: Allergy & Precautions, NPO status , Patient's Chart, lab work & pertinent test results  Airway Mallampati: II  TM Distance: >3 FB Neck ROM: Full    Dental  (+) Teeth Intact, Dental Advisory Given   Pulmonary sleep apnea ,    Pulmonary exam normal breath sounds clear to auscultation       Cardiovascular negative cardio ROS Normal cardiovascular exam Rhythm:Regular Rate:Normal     Neuro/Psych  Neuromuscular disease    GI/Hepatic Neg liver ROS, hiatal hernia, gastrointestinal stromal tumor   Endo/Other  negative endocrine ROS  Renal/GU negative Renal ROS     Musculoskeletal negative musculoskeletal ROS (+)   Abdominal   Peds  Hematology negative hematology ROS (+)   Anesthesia Other Findings   Reproductive/Obstetrics                            Anesthesia Physical Anesthesia Plan  ASA: 3  Anesthesia Plan: MAC   Post-op Pain Management:    Induction: Intravenous  PONV Risk Score and Plan: 1 and TIVA and Treatment may vary due to age or medical condition  Airway Management Planned: Natural Airway and Nasal Cannula  Additional Equipment:   Intra-op Plan:   Post-operative Plan:   Informed Consent: I have reviewed the patients History and Physical, chart, labs and discussed the procedure including the risks, benefits and alternatives for the proposed anesthesia with the patient or authorized representative who has indicated his/her understanding and acceptance.     Dental advisory given  Plan Discussed with: CRNA  Anesthesia Plan Comments:        Anesthesia Quick Evaluation

## 2021-05-18 ENCOUNTER — Ambulatory Visit (HOSPITAL_COMMUNITY)
Admission: RE | Admit: 2021-05-18 | Discharge: 2021-05-18 | Disposition: A | Discharge status: Home / Self Care | Payer: Commercial Managed Care - PPO | Source: Ambulatory Visit | Attending: Gastroenterology | Admitting: Gastroenterology

## 2021-05-18 ENCOUNTER — Telehealth: Payer: Self-pay

## 2021-05-18 ENCOUNTER — Encounter (HOSPITAL_COMMUNITY): Payer: Self-pay | Admitting: Gastroenterology

## 2021-05-18 ENCOUNTER — Other Ambulatory Visit: Payer: Self-pay

## 2021-05-18 ENCOUNTER — Encounter (HOSPITAL_COMMUNITY)
Admission: RE | Disposition: A | Discharge status: Home / Self Care | Payer: Self-pay | Source: Ambulatory Visit | Attending: Gastroenterology

## 2021-05-18 ENCOUNTER — Ambulatory Visit (HOSPITAL_BASED_OUTPATIENT_CLINIC_OR_DEPARTMENT_OTHER): Payer: Commercial Managed Care - PPO | Admitting: Anesthesiology

## 2021-05-18 ENCOUNTER — Ambulatory Visit (HOSPITAL_COMMUNITY): Payer: Commercial Managed Care - PPO | Admitting: Anesthesiology

## 2021-05-18 DIAGNOSIS — G4730 Sleep apnea, unspecified: Secondary | ICD-10-CM | POA: Diagnosis not present

## 2021-05-18 DIAGNOSIS — Z9049 Acquired absence of other specified parts of digestive tract: Secondary | ICD-10-CM | POA: Insufficient documentation

## 2021-05-18 DIAGNOSIS — K449 Diaphragmatic hernia without obstruction or gangrene: Secondary | ICD-10-CM

## 2021-05-18 DIAGNOSIS — Z85038 Personal history of other malignant neoplasm of large intestine: Secondary | ICD-10-CM | POA: Insufficient documentation

## 2021-05-18 DIAGNOSIS — K3189 Other diseases of stomach and duodenum: Secondary | ICD-10-CM

## 2021-05-18 DIAGNOSIS — Z800 Family history of malignant neoplasm of digestive organs: Secondary | ICD-10-CM | POA: Diagnosis not present

## 2021-05-18 HISTORY — PX: EUS: SHX5427

## 2021-05-18 HISTORY — PX: ESOPHAGOGASTRODUODENOSCOPY (EGD) WITH PROPOFOL: SHX5813

## 2021-05-18 SURGERY — UPPER ENDOSCOPIC ULTRASOUND (EUS) RADIAL
Anesthesia: Monitor Anesthesia Care

## 2021-05-18 MED ORDER — LACTATED RINGERS IV SOLN
INTRAVENOUS | Status: DC
Start: 2021-05-18 — End: 2021-05-18
  Administered 2021-05-18: 1000 mL via INTRAVENOUS

## 2021-05-18 MED ORDER — SODIUM CHLORIDE 0.9 % IV SOLN: INTRAVENOUS | Status: DC

## 2021-05-18 MED ORDER — LIDOCAINE HCL (CARDIAC) PF 100 MG/5ML IV SOSY
PREFILLED_SYRINGE | INTRAVENOUS | Status: DC | PRN
  Administered 2021-05-18: 100 mg via INTRAVENOUS

## 2021-05-18 MED ORDER — FENTANYL CITRATE (PF) 100 MCG/2ML IJ SOLN
INTRAMUSCULAR | Status: DC | PRN
  Administered 2021-05-18: 50 ug via INTRAVENOUS

## 2021-05-18 MED ORDER — PROPOFOL 500 MG/50ML IV EMUL
INTRAVENOUS | Status: DC | PRN
  Administered 2021-05-18: 500 ug/kg/min via INTRAVENOUS

## 2021-05-18 MED ORDER — PROPOFOL 1000 MG/100ML IV EMUL
INTRAVENOUS | Status: AC
  Filled 2021-05-18: qty 100

## 2021-05-18 MED ORDER — ONDANSETRON HCL 4 MG/2ML IJ SOLN
INTRAMUSCULAR | Status: DC | PRN
  Administered 2021-05-18: 4 mg via INTRAVENOUS

## 2021-05-18 SURGICAL SUPPLY — 15 items

## 2021-05-18 NOTE — Anesthesia Procedure Notes (Signed)
Date/Time: 05/18/2021 7:30 AM Performed by: Lissa Morales, CRNA Pre-anesthesia Checklist: Patient identified, Emergency Drugs available, Suction available, Patient being monitored and Timeout performed Patient Re-evaluated:Patient Re-evaluated prior to induction Oxygen Delivery Method: Simple face mask Placement Confirmation: positive ETCO2

## 2021-05-18 NOTE — Op Note (Addendum)
[facility] Patient Name: Aaron Sharp Procedure Date: 05/18/2021 MRN: [id number] Attending MD: [name] , MD Date of Birth: 03-11-1966 CSN: [id number] Age: 56 Admit Type: Outpatient Procedure:                Upper EUS Indications:              Subepith lesion in proximal stomach noted                            originally by Dr. Fuller Plan EGD 2006; EUS 2006 2.5cm by                            1.1cm cytology "bland spindle cells". EUS 2012                            2.5cm by 1.2cm. EUS. EUS 2019 2.7cm by 1.1cm                            cytology "bland spindle cells". Providers:                [name], MD, Doristine Johns, RN, Luan Moore, Technician, Enrigue Catena, CRNA Referring MD:              Medicines:                Monitored Anesthesia Care Complications:            No immediate complications. Estimated blood loss:                            None. Estimated Blood Loss:     Estimated blood loss: none. Procedure:                Pre-Anesthesia Assessment:                           - Prior to the procedure, a History and Physical                            was performed, and patient medications and                            allergies were reviewed. The patient's tolerance of                            previous anesthesia was also reviewed. The risks                            and benefits of the procedure and the sedation                            options and risks were discussed with the patient.  All questions were answered, and informed consent                            was obtained. Prior Anticoagulants: The patient has                            taken no previous anticoagulant or antiplatelet                            agents. ASA Grade Assessment: II - A patient with                            mild systemic disease. After reviewing the risks                            and benefits, the  patient was deemed in                            satisfactory condition to undergo the procedure.                           After obtaining informed consent, the endoscope was                            passed under direct vision. Throughout the                            procedure, the patient's blood pressure, pulse, and                            oxygen saturations were monitored continuously. The                            [id number] ([id number]) Olympus radial ultrasound                            scope was introduced through the mouth, and                            advanced to the second part of duodenum. The upper                            EUS was accomplished without difficulty. The                            patient tolerated the procedure well. Scope In: Scope Out: Findings:      ENDOSCOPIC FINDING: :      The examined esophagus was endoscopically normal.      The previously known proximal gastric subepithelial lesion was again       located, about 1-2cm distal to the GE junction. This had normal       overlying mucosa, measured about 2cm endoscopically.      The examined duodenum was endoscopically normal.      ENDOSONOGRAPHIC FINDING: :  1. An oval intramural (subepithelial) lesion was found in the cardia of       the stomach. It was encountered 1-2cm distal to the gastroesophageal       junction. The lesion was hypoechoic. The lesion clearly communicates       with the muscularis propria layer, it is oval shaped, measures 2.9cm by       1.2cm maximally.      2. No perigastric adenopathy.      3. Limited views of the liver, pancreas, spleen, portal and splenic       vessels were all normal. Impression:               - The proximal gastric subepithelial lesion has                            slowly grown from 2.5cm in 2006 to 2.9cm today. I                            suspect this is a small GIST based on spindle cells                            seen twice on FNA. I am  going to send him to see a                            surgeon to consider elective resection. Moderate Sedation:      Not Applicable - Patient had care per Anesthesia. Recommendation:           - Discharge patient to home. Procedure Code(s):        --- Professional ---                           [phone], Esophagogastroduodenoscopy, flexible,                            transoral; with endoscopic ultrasound examination                            limited to the esophagus, stomach or duodenum, and                            adjacent structures Diagnosis Code(s):        --- Professional ---                           K31.89, Other diseases of stomach and duodenum CPT copyright 2019 American Medical Association. All rights reserved. The codes documented in this report are preliminary and upon coder review may  be revised to meet current compliance requirements. [name], MD 05/18/2021 8:04:53 AM This report has been signed electronically. Number of Addenda: 0

## 2021-05-18 NOTE — Transfer of Care (Signed)
Immediate Anesthesia Transfer of Care Note  Patient: Aaron Sharp  Procedure(s) Performed: UPPER ENDOSCOPIC ULTRASOUND (EUS) RADIAL ESOPHAGOGASTRODUODENOSCOPY (EGD) WITH PROPOFOL  Patient Location: PACU  Anesthesia Type:MAC  Level of Consciousness: sedated and patient cooperative  Airway & Oxygen Therapy: Patient Spontanous Breathing and Patient connected to face mask oxygen  Post-op Assessment: Report given to RN and Post -op Vital signs reviewed and stable  Post vital signs: stable  Last Vitals:  Vitals Value Taken Time  BP 100/57 05/18/21 0752  Temp 36.6 C 05/18/21 0752  Pulse 59 05/18/21 0755  Resp 12 05/18/21 0755  SpO2 100 % 05/18/21 0755  Vitals shown include unvalidated device data.  Last Pain:  Vitals:   05/18/21 0752  TempSrc: Oral  PainSc:          Complications: No notable events documented.

## 2021-05-18 NOTE — Anesthesia Postprocedure Evaluation (Signed)
Anesthesia Post Note  Patient: Aaron Sharp  Procedure(s) Performed: UPPER ENDOSCOPIC ULTRASOUND (EUS) RADIAL ESOPHAGOGASTRODUODENOSCOPY (EGD) WITH PROPOFOL     Patient location during evaluation: Endoscopy Anesthesia Type: MAC Level of consciousness: awake, awake and alert and oriented Pain management: pain level controlled Vital Signs Assessment: post-procedure vital signs reviewed and stable Respiratory status: spontaneous breathing, nonlabored ventilation and respiratory function stable Cardiovascular status: blood pressure returned to baseline and stable Postop Assessment: no headache, no backache and no apparent nausea or vomiting Anesthetic complications: no   No notable events documented.  Last Vitals:  Vitals:   05/18/21 0810 05/18/21 0820  BP: 108/62 126/87  Pulse: (!) 51 63  Resp: 13 19  Temp:    SpO2: 100% 99%    Last Pain:  Vitals:   05/18/21 0820  TempSrc:   PainSc: 0-No pain                 Santa Lighter

## 2021-05-18 NOTE — Discharge Instructions (Signed)
YOU HAD AN ENDOSCOPIC PROCEDURE TODAY: Refer to the procedure report and other information in the discharge instructions given to you for any specific questions about what was found during the examination. If this information does not answer your questions, please call [place] office at [phone] to clarify.   YOU SHOULD EXPECT: Some feelings of bloating in the abdomen. Passage of more gas than usual. Walking can help get rid of the air that was put into your GI tract during the procedure and reduce the bloating. If you had a lower endoscopy (such as a colonoscopy or flexible sigmoidoscopy) you may notice spotting of blood in your stool or on the toilet paper. Some abdominal soreness may be present for a day or two, also.  DIET: Your first meal following the procedure should be a light meal and then it is ok to progress to your normal diet. A half-sandwich or bowl of soup is an example of a good first meal. Heavy or fried foods are harder to digest and may make you feel nauseous or bloated. Drink plenty of fluids but you should avoid alcoholic beverages for 24 hours. If you had a esophageal dilation, please see attached instructions for diet.    ACTIVITY: Your care partner should take you home directly after the procedure. You should plan to take it easy, moving slowly for the rest of the day. You can resume normal activity the day after the procedure however YOU SHOULD NOT DRIVE, use power tools, machinery or perform tasks that involve climbing or major physical exertion for 24 hours (because of the sedation medicines used during the test).   SYMPTOMS TO REPORT IMMEDIATELY: A gastroenterologist can be reached at any hour. Please call [phone]  for any of the following symptoms:   Following upper endoscopy (EGD, EUS, ERCP, esophageal dilation) Vomiting of blood or coffee ground material  New, significant abdominal pain  New, significant chest pain or pain under the shoulder blades  Painful or  persistently difficult swallowing  New shortness of breath  Black, tarry-looking or red, bloody stools  FOLLOW UP:  If any biopsies were taken you will be contacted by phone or by letter within the next 1-3 weeks. Call [phone]  if you have not heard about the biopsies in 3 weeks.  Please also call with any specific questions about appointments or follow up tests.  

## 2021-05-18 NOTE — Telephone Encounter (Signed)
Referral has been made and records sent

## 2021-05-18 NOTE — H&P (Signed)
Review of pertinent gastrointestinal problems: 1.  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  Colonoscopy December 2019 for routine risk screening found this polyp, removed with snare cautery.  Pathology proved adenocarcinoma to the margin.  Follow-up right hemicolectomy January 2020 showed no residual adenocarcinoma at the site.  9 lymph nodes were removed and they were all negative for cancer. Colonoscopy 03/2019 normal IC anastomosis, no polyps.  Recall 03/2022 2. 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 Fuller Plan by EGD. Follow up  EUS 2006 also showed this, measured about 2-1/2 cm, FNA showed spindle cells but no special staining done. EUS 2012 showed the lesion had not changed in size (over 6 years).  EUS December 2019 showed essentially the same size, FNA again proved rare, bland spindle cells.  I recommended repeat examination at 3-year interval again.  HPI: This is a man with gastric subepith lesion noted originally about 17 years ago   ROS: complete GI ROS as described in HPI, all other review negative.  Constitutional:  No unintentional weight loss   Past Medical History:  Diagnosis Date   Bronchitis 04/17/2018   diagnosed with bronchitis and placed on Antibiotics   Cancer ([place])    colcon cancer   GIST (gastrointestinal stromal tumor), non-malignant    Hiatal hernia     Past Surgical History:  Procedure Laterality Date   COLONOSCOPY WITH PROPOFOL N/A 03/13/2018   Procedure: COLONOSCOPY WITH PROPOFOL;  Surgeon: [name], MD;  Location: [facility];  Service: Endoscopy;  Laterality: N/A;   ESOPHAGOGASTRODUODENOSCOPY N/A 03/13/2018   Procedure: ESOPHAGOGASTRODUODENOSCOPY (EGD);  Surgeon: [name], MD;  Location: [facility];  Service: Endoscopy;  Laterality: N/A;   EUS N/A 03/13/2018   Procedure: UPPER ENDOSCOPIC ULTRASOUND (EUS) RADIAL;  Surgeon: [name], MD;  Location: [facility];  Service: Endoscopy;   Laterality: N/A;   FINE NEEDLE ASPIRATION N/A 03/13/2018   Procedure: FINE NEEDLE ASPIRATION (FNA) LINEAR;  Surgeon: [name], MD;  Location: [facility];  Service: Endoscopy;  Laterality: N/A;   LAPAROSCOPIC PARTIAL COLECTOMY N/A 05/01/2018   Procedure: LAPAROSCOPIC PARTIAL COLECTOMY;  Surgeon: Leighton Ruff, MD;  Location: [facility];  Service: General;  Laterality: N/A;   POLYPECTOMY  03/13/2018   Procedure: POLYPECTOMY;  Surgeon: [name], MD;  Location: [facility];  Service: Endoscopy;;   SHOULDER SURGERY     WISDOM TOOTH EXTRACTION      Current Facility-Administered Medications  Medication Dose Route Frequency Provider Last Rate Last Admin   0.9 %  sodium chloride infusion   Intravenous Continuous [name], MD       lactated ringers infusion   Intravenous Continuous [name], MD 10 mL/hr at 05/18/21 0706 1,000 mL at 05/18/21 0706    Allergies as of 03/30/2021   (No Known Allergies)    Family History  Problem Relation Age of Onset   Depression Mother    Colon cancer Paternal Grandfather    Prostate cancer Other    Hypertension Daughter    Breast cancer Paternal Aunt     Social History   Socioeconomic History   Marital status: Married    Spouse name: Not on file   Number of children: 2   Years of education: Not on file   Highest education level: Not on file  Occupational History   Occupation: Teacher, adult education    Employer: [employer]  Tobacco Use   Smoking status: Never   Smokeless tobacco: Never  Vaping Use   Vaping Use: Never used  Substance and Sexual Activity   Alcohol use: Yes   Drug use: No   Sexual activity: Yes  Other Topics Concern   Not on file  Social History Narrative   Not on file   Social Determinants of Health   Financial Resource Strain: Not on file  Food Insecurity: Not on file  Transportation Needs: Not on file  Physical Activity: Not on file  Stress: Not on file  Social Connections: Not on  file  Intimate Partner Violence: Not on file     Physical Exam: BP 128/84    Pulse 61    Temp (!) 97.4 F (36.3 C) (Tympanic)    Resp 14    Ht 5\' 11"  (1.803 m)    Wt 88.5 kg    SpO2 100%    BMI 27.20 kg/m  Constitutional: generally well-appearing Psychiatric: alert and oriented x3 Abdomen: soft, nontender, nondistended, no obvious ascites, no peritoneal signs, normal bowel sounds No peripheral edema noted in lower extremities  Assessment and plan: 56 y.o. male with gastric subepit lesion since 2006  Surveillance EUS today  Please see the "Patient Instructions" section for addition details about the plan.  Owens Loffler, MD [facility] 05/18/2021, 7:09 AM

## 2021-05-18 NOTE — Telephone Encounter (Signed)
-----   Message from [name], MD sent at 05/18/2021  8:05 AM EST ----- He needs referral to [facility] for slowly growing proximal gastric lesion (likely GIST). Was 2.5cm in 2006 and is now 2.9cm.  Consider elective resection.  Thanks

## 2021-05-25 ENCOUNTER — Telehealth: Payer: Self-pay | Admitting: Gastroenterology

## 2021-05-25 NOTE — Telephone Encounter (Signed)
Inbound call from [facility] from [facility] [place] requesting a call back stating that they never received the pathology and colonoscopy report from pt's last procedure. You can call him at [phone] and fax number is [phone]. Please advise thank you.

## 2021-05-26 NOTE — Telephone Encounter (Signed)
I have faxed the records as requested

## 2021-05-27 ENCOUNTER — Other Ambulatory Visit: Payer: Self-pay

## 2021-06-06 NOTE — Telephone Encounter (Signed)
All records refaxed as requested- I did receive confirmation that they were received in Feb when faxed.   ?

## 2021-06-06 NOTE — Telephone Encounter (Signed)
Franchot Gallo NP-from [facility] called requesting the reports to be faxed to their office for this patient she has an appt for tomorrow and they still have not received anything.  ? ?Fax: [phone] ? ?She is requesting both EUS reports 2019 and 2023 along with pathology.  ? ?

## 2021-06-07 NOTE — Telephone Encounter (Signed)
Received a call from [facility] stating that they still do not have the requested records. I have faxed the 2019/2023 EUS report and pathology report from 2019. No path for 2023. I have faxed to the provided fax # below with ATTN to [facility]. ?

## 2021-07-14 ENCOUNTER — Other Ambulatory Visit: Payer: Self-pay | Admitting: Family Medicine

## 2021-07-14 NOTE — Telephone Encounter (Signed)
Sent patient MyChart message to see if he was still using medications as chart indicates that he no longer used Vit D as of February 2023.  ?

## 2021-07-17 MED ORDER — GABAPENTIN 100 MG PO CAPS: 200.0000 mg | ORAL_CAPSULE | Freq: Every day | ORAL | 0 refills | Status: DC

## 2021-07-17 MED ORDER — VITAMIN D (ERGOCALCIFEROL) 1.25 MG (50000 UNIT) PO CAPS: 50000.0000 [IU] | ORAL_CAPSULE | ORAL | 0 refills | Status: DC

## 2021-12-26 ENCOUNTER — Encounter: Payer: Commercial Managed Care - PPO | Admitting: Internal Medicine

## 2022-02-08 ENCOUNTER — Ambulatory Visit: Payer: Commercial Managed Care - PPO | Admitting: Internal Medicine

## 2022-04-09 ENCOUNTER — Encounter: Payer: Self-pay | Admitting: Internal Medicine

## 2022-04-17 ENCOUNTER — Ambulatory Visit (INDEPENDENT_AMBULATORY_CARE_PROVIDER_SITE_OTHER): Payer: Commercial Managed Care - PPO | Admitting: Internal Medicine

## 2022-04-17 ENCOUNTER — Encounter: Payer: Self-pay | Admitting: Internal Medicine

## 2022-04-17 VITALS — BP 120/72 | HR 61 | Temp 98.9°F | Ht 71.0 in | Wt 205.0 lb

## 2022-04-17 DIAGNOSIS — R0789 Other chest pain: Secondary | ICD-10-CM | POA: Diagnosis not present

## 2022-04-17 DIAGNOSIS — Z86018 Personal history of other benign neoplasm: Secondary | ICD-10-CM

## 2022-04-17 DIAGNOSIS — F4321 Adjustment disorder with depressed mood: Secondary | ICD-10-CM

## 2022-04-17 DIAGNOSIS — Z0000 Encounter for general adult medical examination without abnormal findings: Secondary | ICD-10-CM

## 2022-04-17 DIAGNOSIS — Z85038 Personal history of other malignant neoplasm of large intestine: Secondary | ICD-10-CM

## 2022-04-17 LAB — COMPREHENSIVE METABOLIC PANEL
ALT: 31 U/L (ref 0–53)
AST: 22 U/L (ref 0–37)
Albumin: 4.9 g/dL (ref 3.5–5.2)
Alkaline Phosphatase: 65 U/L (ref 39–117)
BUN: 19 mg/dL (ref 6–23)
CO2: 27 mEq/L (ref 19–32)
Calcium: 10.1 mg/dL (ref 8.4–10.5)
Chloride: 102 mEq/L (ref 96–112)
Creatinine, Ser: 1.17 mg/dL (ref 0.40–1.50)
GFR: 70.74 mL/min (ref >60.00)
Glucose, Bld: 86 mg/dL (ref 70–99)
Potassium: 4.9 mEq/L (ref 3.5–5.1)
Sodium: 137 mEq/L (ref 135–145)
Total Bilirubin: 0.6 mg/dL (ref 0.2–1.2)
Total Protein: 7.7 g/dL (ref 6.0–8.3)

## 2022-04-17 LAB — CBC WITH DIFFERENTIAL/PLATELET
Basophils Absolute: 0 10*3/uL (ref 0.0–0.1)
Basophils Relative: 0.6 % (ref 0.0–3.0)
Eosinophils Absolute: 0.1 10*3/uL (ref 0.0–0.7)
Eosinophils Relative: 1.5 % (ref 0.0–5.0)
HCT: 45.2 % (ref 39.0–52.0)
Hemoglobin: 15.7 g/dL (ref 13.0–17.0)
Lymphocytes Relative: 29.7 % (ref 12.0–46.0)
Lymphs Abs: 1.8 10*3/uL (ref 0.7–4.0)
MCHC: 34.8 g/dL (ref 30.0–36.0)
MCV: 92.3 fl (ref 78.0–100.0)
Monocytes Absolute: 0.5 10*3/uL (ref 0.1–1.0)
Monocytes Relative: 8.6 % (ref 3.0–12.0)
Neutro Abs: 3.7 10*3/uL (ref 1.4–7.7)
Neutrophils Relative %: 59.6 % (ref 43.0–77.0)
Platelets: 212 10*3/uL (ref 150.0–400.0)
RBC: 4.9 Mil/uL (ref 4.22–5.81)
RDW: 12.4 % (ref 11.5–15.5)
WBC: 6.2 10*3/uL (ref 4.0–10.5)

## 2022-04-17 LAB — LIPID PANEL
Cholesterol: 282 mg/dL — ABNORMAL HIGH (ref 0–200)
HDL: 40.2 mg/dL (ref >39.00)
Total CHOL/HDL Ratio: 7
Triglycerides: 663 mg/dL — ABNORMAL HIGH (ref 0.0–149.0)

## 2022-04-17 LAB — URINALYSIS
Bilirubin Urine: NEGATIVE
Hgb urine dipstick: NEGATIVE
Ketones, ur: NEGATIVE
Leukocytes,Ua: NEGATIVE
Nitrite: NEGATIVE
Specific Gravity, Urine: 1.025 (ref 1.000–1.030)
Total Protein, Urine: NEGATIVE
Urine Glucose: NEGATIVE
Urobilinogen, UA: 0.2 (ref 0.0–1.0)
pH: 5.5 (ref 5.0–8.0)

## 2022-04-17 LAB — TSH: TSH: 4.09 u[IU]/mL (ref 0.35–5.50)

## 2022-04-17 LAB — PSA: PSA: 2.91 ng/mL (ref 0.10–4.00)

## 2022-04-17 LAB — LDL CHOLESTEROL, DIRECT: Direct LDL: 100 mg/dL

## 2022-04-17 NOTE — Assessment & Plan Note (Signed)
[facility] - at [facility]

## 2022-04-17 NOTE — Progress Notes (Signed)
Subjective:  Patient ID: KOLBEE BOGUSZ, male    DOB: 05-19-1965  Age: 57 y.o. MRN: [id number]  CC: Annual Exam (Physical. Chest pain that comes and goes.)   HPI ZETHAN ALFIERI presents for a well exam C/o CP - mom was dying from C diff - passed on 04-03-22   Outpatient Medications Prior to Visit  Medication Sig Dispense Refill   b complex vitamins capsule Take 1 capsule by mouth daily. 100 capsule 3   Tetrahydrozoline HCl (VISINE OP) Place 1 drop into both eyes daily as needed (redness).     gabapentin (NEURONTIN) 100 MG capsule Take 2 capsules (200 mg total) by mouth at bedtime. (Patient not taking: Reported on 04/17/2022) 180 capsule 0   Vitamin D, Ergocalciferol, (DRISDOL) 1.25 MG (50000 UNIT) CAPS capsule Take 1 capsule (50,000 Units total) by mouth every 7 (seven) days. (Patient not taking: Reported on 04/17/2022) 12 capsule 0   No facility-administered medications prior to visit.    ROS: Review of Systems  Constitutional:  Negative for appetite change, fatigue and unexpected weight change.  HENT:  Negative for congestion, nosebleeds, sneezing, sore throat and trouble swallowing.   Eyes:  Negative for itching and visual disturbance.  Respiratory:  Negative for cough.   Cardiovascular:  Negative for chest pain, palpitations and leg swelling.  Gastrointestinal:  Negative for abdominal distention, blood in stool, diarrhea and nausea.  Genitourinary:  Negative for frequency and hematuria.  Musculoskeletal:  Negative for back pain, gait problem, joint swelling and neck pain.  Skin:  Negative for rash.  Neurological:  Negative for dizziness, tremors, speech difficulty and weakness.  Psychiatric/Behavioral:  Negative for agitation, dysphoric mood and sleep disturbance. The patient is not nervous/anxious.     Objective:  BP 120/72 (BP Location: Left Arm, Patient Position: Sitting, Cuff Size: Large)   Pulse 61   Temp 98.9 F (37.2 C) (Oral)   Ht '5\' 11"'$  (1.803 m)   Wt  205 lb (93 kg)   SpO2 98%   BMI 28.59 kg/m   BP Readings from Last 3 Encounters:  04/17/22 120/72  05/18/21 126/87  12/01/20 110/70    Wt Readings from Last 3 Encounters:  04/17/22 205 lb (93 kg)  05/18/21 195 lb (88.5 kg)  12/01/20 193 lb (87.5 kg)    Physical Exam Constitutional:      General: He is not in acute distress.    Appearance: He is well-developed.     Comments: NAD  Eyes:     Conjunctiva/sclera: Conjunctivae normal.     Pupils: Pupils are equal, round, and reactive to light.  Neck:     Thyroid: No thyromegaly.     Vascular: No JVD.  Cardiovascular:     Rate and Rhythm: Normal rate and regular rhythm.     Heart sounds: Normal heart sounds. No murmur heard.    No friction rub. No gallop.  Pulmonary:     Effort: Pulmonary effort is normal. No respiratory distress.     Breath sounds: Normal breath sounds. No wheezing or rales.  Chest:     Chest wall: No tenderness.  Abdominal:     General: Bowel sounds are normal. There is no distension.     Palpations: Abdomen is soft. There is no mass.     Tenderness: There is no abdominal tenderness. There is no guarding or rebound.  Musculoskeletal:        General: No tenderness. Normal range of motion.     Cervical back:  Normal range of motion.  Lymphadenopathy:     Cervical: No cervical adenopathy.  Skin:    General: Skin is warm and dry.     Findings: No rash.  Neurological:     Mental Status: He is alert and oriented to person, place, and time.     Cranial Nerves: No cranial nerve deficit.     Motor: No abnormal muscle tone.     Coordination: Coordination normal.     Gait: Gait normal.     Deep Tendon Reflexes: Reflexes are normal and symmetric.  Psychiatric:        Behavior: Behavior normal.        Thought Content: Thought content normal.        Judgment: Judgment normal.      Procedure: EKG Indication: chest pain Impression: S brady. No acute changes.  Lab Results  Component Value Date   WBC 5.6  07/19/2020   HGB 14.4 07/19/2020   HCT 41.8 07/19/2020   PLT 195.0 07/19/2020   GLUCOSE 74 07/19/2020   CHOL 183 07/19/2020   TRIG 203.0 (H) 07/19/2020   HDL 40.90 07/19/2020   LDLDIRECT 113.0 07/19/2020   LDLCALC 109 (H) 01/09/2018   ALT 20 07/19/2020   AST 19 07/19/2020   NA 138 07/19/2020   K 4.2 07/19/2020   CL 103 07/19/2020   CREATININE 1.06 07/19/2020   BUN 16 07/19/2020   CO2 27 07/19/2020   TSH 2.76 07/19/2020   PSA 2.09 07/19/2020   INR 1.09 11/17/2009    No results found.  Assessment & Plan:   Problem List Items Addressed This Visit       Other   Well adult exam - Primary     We discussed age appropriate health related issues, including available/recomended screening tests and vaccinations. Labs were ordered to be later reviewed . All questions were answered. We discussed one or more of the following - seat belt use, use of sunscreen/sun exposure exercise, fall risk reduction, second hand smoke exposure, firearm use and storage, seat belt use, a need for adhering to healthy diet and exercise. Labs were ordered.  All questions were answered. Colon/EGD at [facility] skin exams by Derm      Relevant Orders   TSH   Urinalysis   CBC with Differential/Platelet   Lipid panel   PSA   Comprehensive metabolic panel   History of colon cancer     S/p lap R colectomy on 05/01/18 - Dr Joyice Faster Dr Ardis Hughs - last colon 2021 due in 2024; had it in 2023 GI - at [facility]      History of benign esophageal tumor    [facility] - at [facility]      Grief    Discussed: mom was dying from C diff - passed on 04-06-22      Chest pain, atypical    Likely stress related: mom was dying from C diff - passed on 2022-04-06 ASA 81 mg EKG OK CTcalc score      Relevant Orders   CT CARDIAC SCORING (SELF PAY ONLY)      No orders of the defined types were placed in this encounter.     Follow-up: Return in about 1 year (around 04/18/2023) for Wellness Exam.  Walker Kehr,  MD

## 2022-04-17 NOTE — Assessment & Plan Note (Signed)
Discussed: mom was dying from C diff - passed on 2022-04-18

## 2022-04-17 NOTE — Assessment & Plan Note (Addendum)
S/p lap R colectomy on 05/01/18 - Dr Joyice Faster Dr Ardis Hughs - last colon 2021 due in 2024; had it in 2023 GI - at [facility]

## 2022-04-17 NOTE — Assessment & Plan Note (Addendum)
  We discussed age appropriate health related issues, including available/recomended screening tests and vaccinations. Labs were ordered to be later reviewed . All questions were answered. We discussed one or more of the following - seat belt use, use of sunscreen/sun exposure exercise, fall risk reduction, second hand smoke exposure, firearm use and storage, seat belt use, a need for adhering to healthy diet and exercise. Labs were ordered.  All questions were answered. Colon/EGD at [facility] skin exams by Derm

## 2022-04-17 NOTE — Addendum Note (Signed)
Addended by: Vertell Novak on: 04/17/2022 10:25 AM   Modules accepted: Orders

## 2022-04-17 NOTE — Patient Instructions (Signed)

## 2022-04-17 NOTE — Assessment & Plan Note (Addendum)
Likely stress related: mom was dying from C diff - passed on Apr 20, 2022 ASA 81 mg EKG OK CTcalc score

## 2022-04-19 ENCOUNTER — Other Ambulatory Visit: Payer: Self-pay | Admitting: Internal Medicine

## 2022-04-19 DIAGNOSIS — E785 Hyperlipidemia, unspecified: Secondary | ICD-10-CM

## 2022-05-15 ENCOUNTER — Ambulatory Visit
Admission: RE | Admit: 2022-05-15 | Discharge: 2022-05-15 | Disposition: A | Discharge status: Home / Self Care | Payer: Commercial Managed Care - PPO | Source: Ambulatory Visit | Attending: Internal Medicine | Admitting: Internal Medicine

## 2022-05-15 DIAGNOSIS — R0789 Other chest pain: Secondary | ICD-10-CM

## 2022-07-19 IMAGING — DX DG FOOT COMPLETE 3+V*L*
3 series · 3 of 3 positions shown · non-contrast
Comparison: None.

CLINICAL DATA: Left foot pain generalized to the right great toe,
recent trauma

EXAM:
LEFT FOOT - COMPLETE 3+ VIEW

[foot ap]
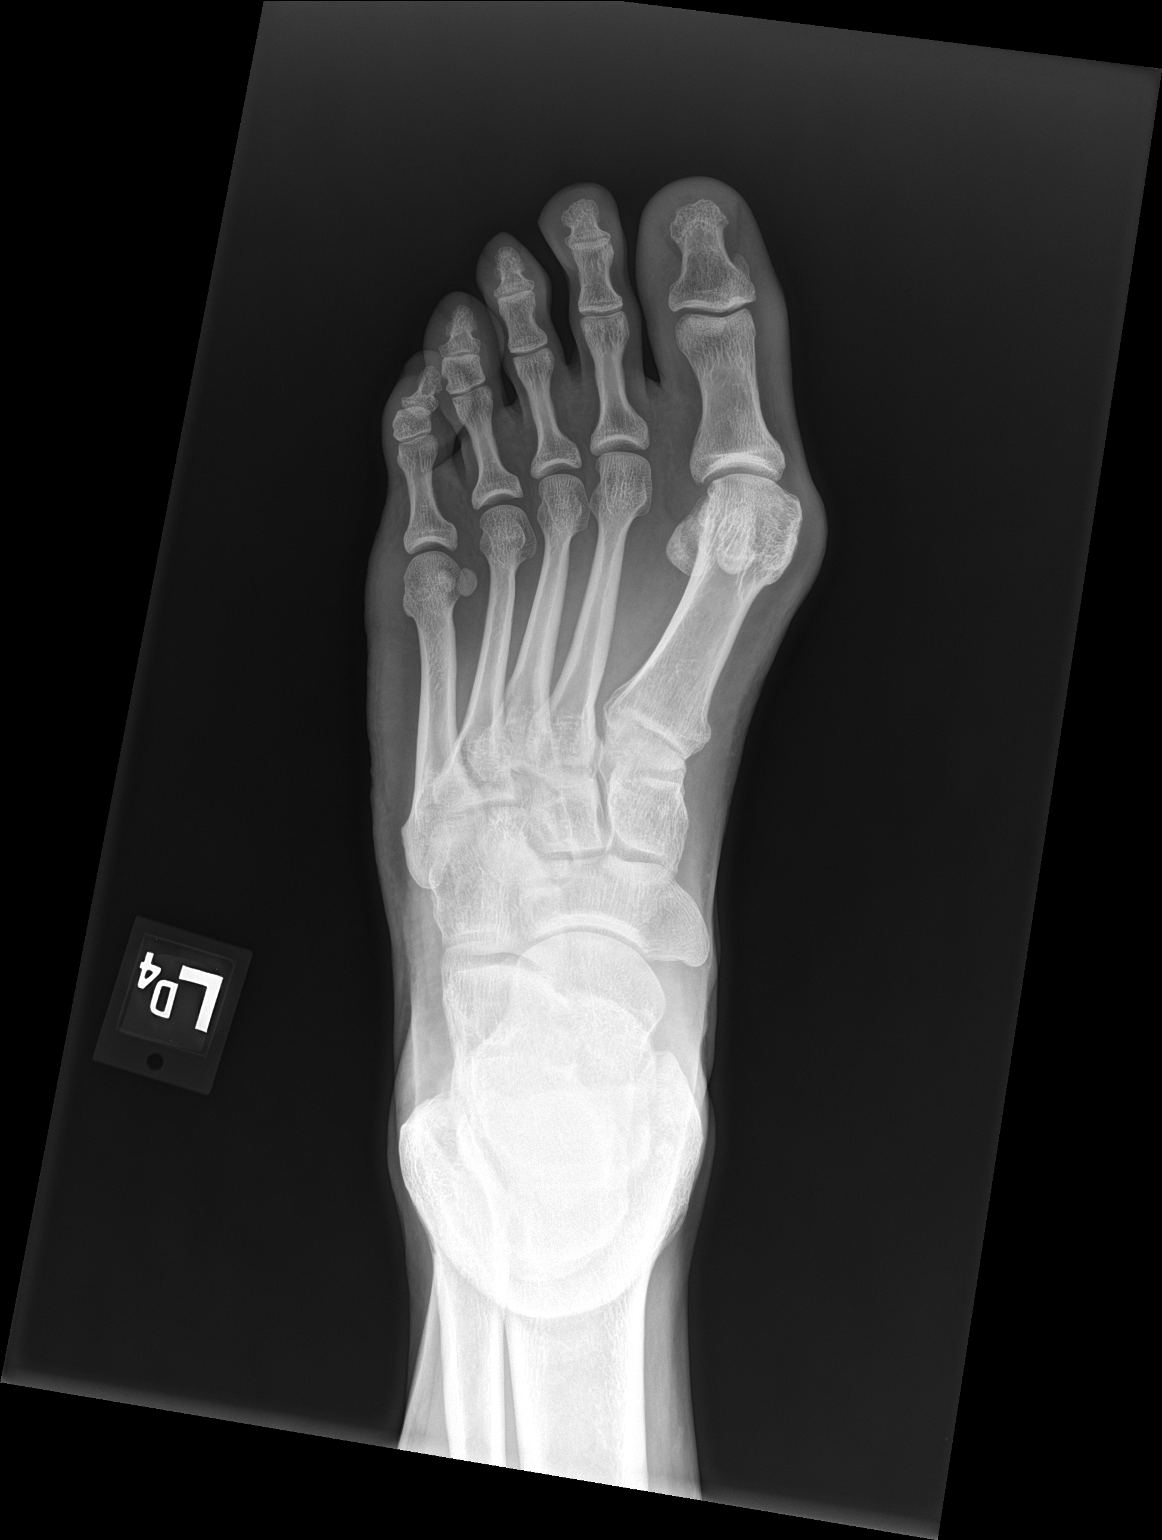

[foot obl]
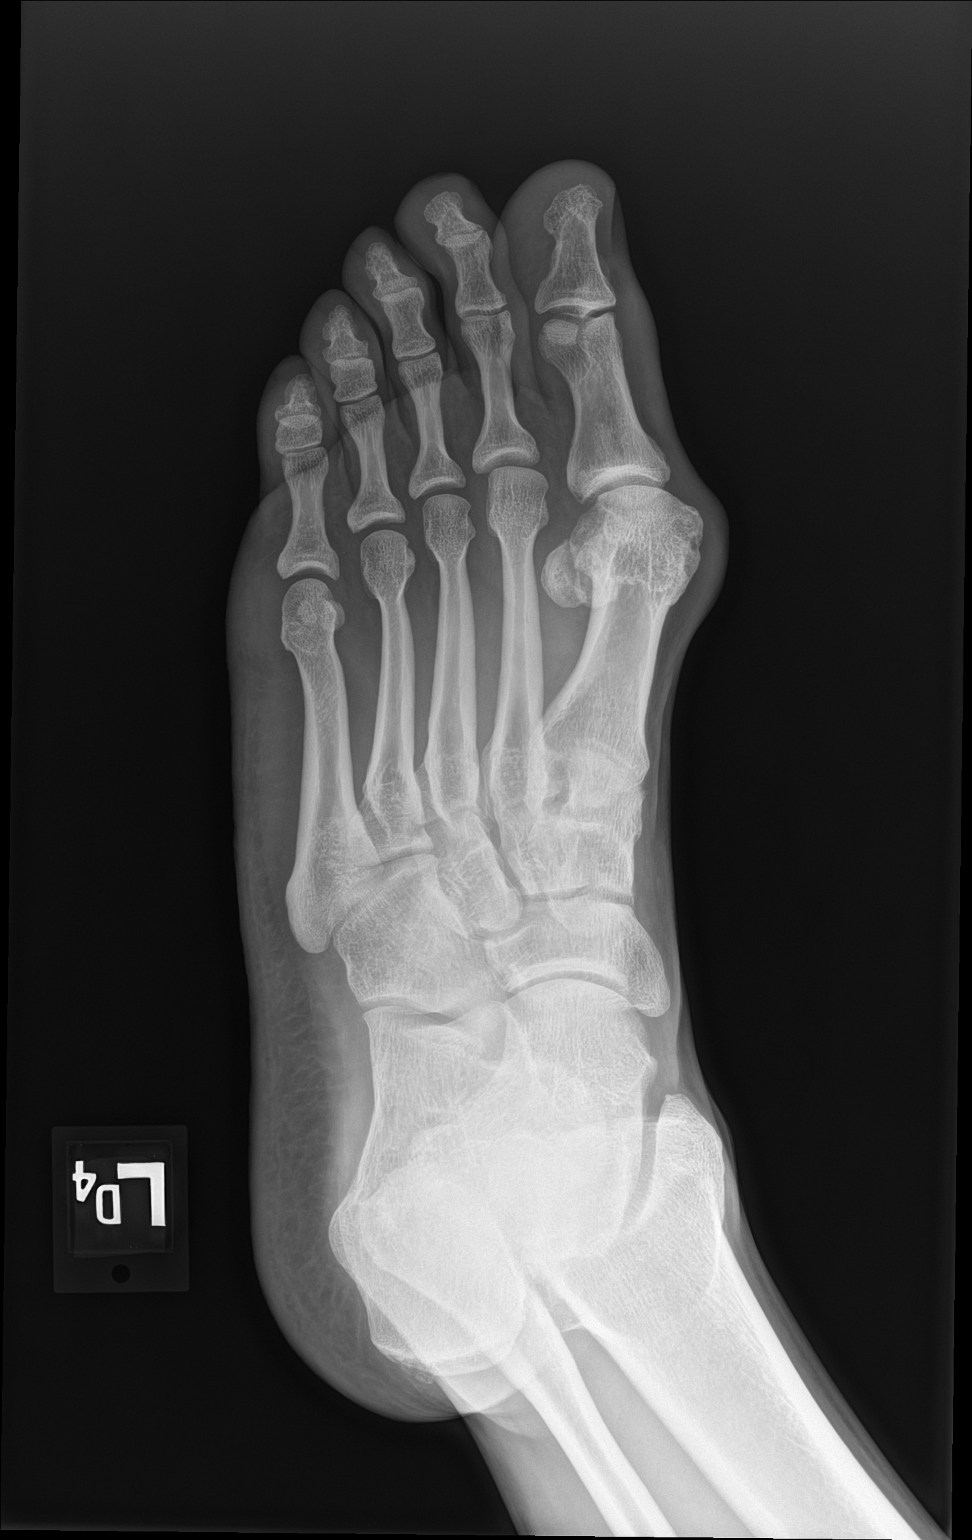

[foot lat]
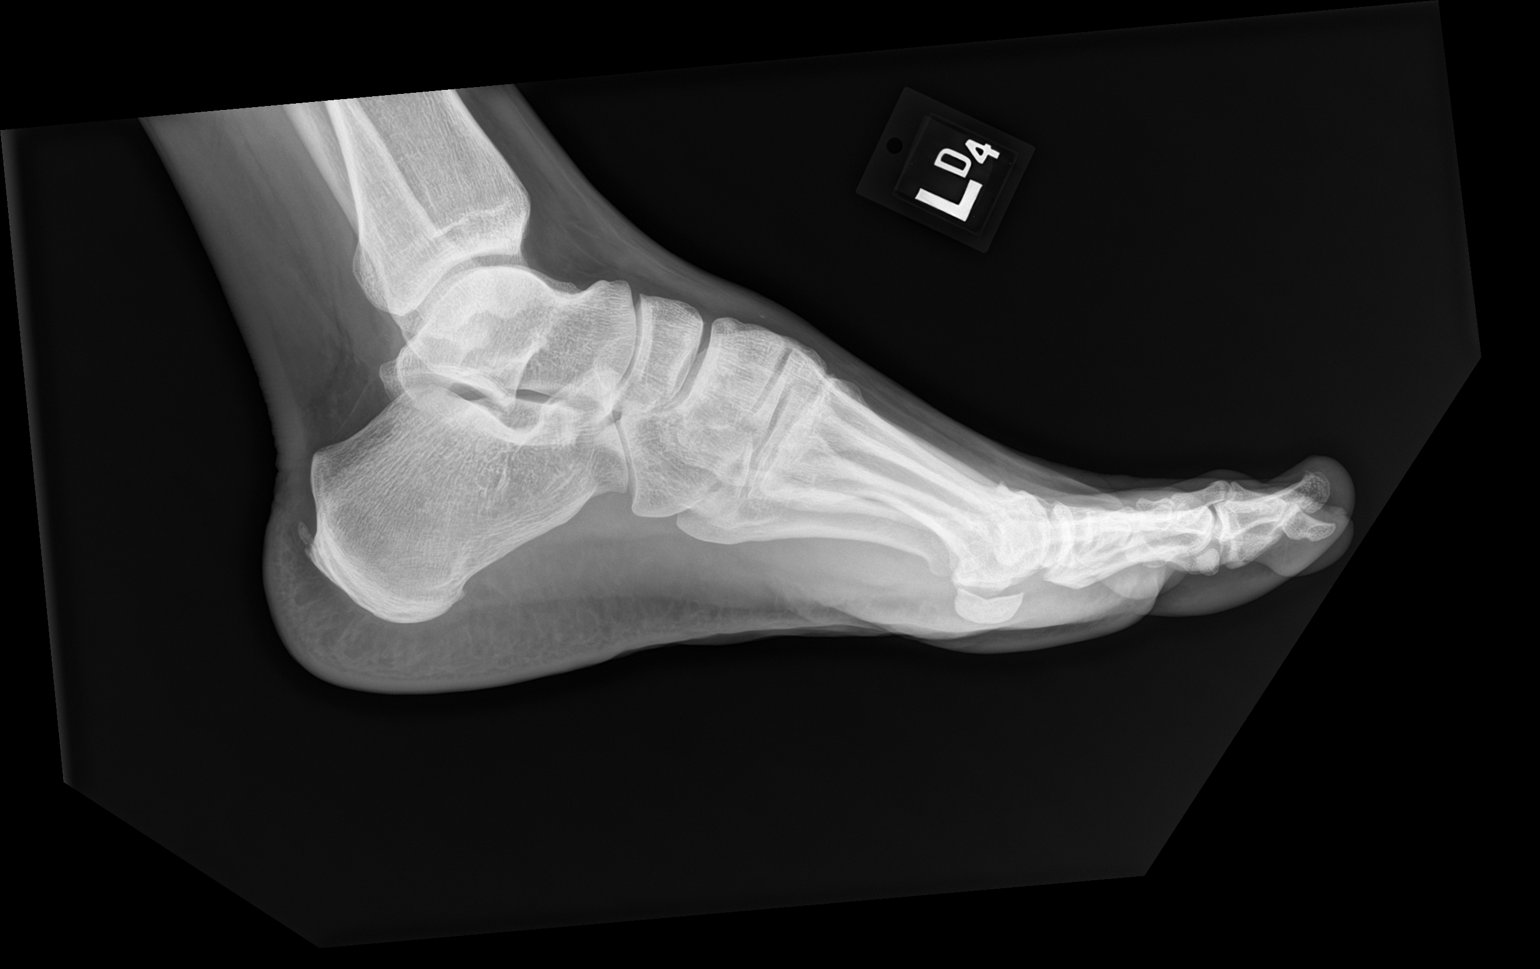

[3 of 3 positions shown; findings below may reference images not displayed]

FINDINGS: Hallux valgus deformity and mild degenerative changes of the left
first MTP joint noted. No acute osseous finding, fracture, or
malalignment. No soft tissue abnormality. No other joint
abnormality.
IMPRESSION: Left first MTP joint degenerative change and hallux valgus
deformity.

No acute osseous finding.

## 2022-08-13 ENCOUNTER — Telehealth: Payer: Commercial Managed Care - PPO | Admitting: Physician Assistant

## 2022-08-13 DIAGNOSIS — J069 Acute upper respiratory infection, unspecified: Secondary | ICD-10-CM

## 2022-08-13 DIAGNOSIS — B9689 Other specified bacterial agents as the cause of diseases classified elsewhere: Secondary | ICD-10-CM | POA: Diagnosis not present

## 2022-08-13 MED ORDER — AMOXICILLIN-POT CLAVULANATE 875-125 MG PO TABS: 1.0000 | ORAL_TABLET | Freq: Two times a day (BID) | ORAL | 0 refills | Status: DC

## 2022-08-13 NOTE — Progress Notes (Signed)

## 2022-09-05 ENCOUNTER — Encounter: Payer: Self-pay | Admitting: Internal Medicine

## 2022-09-05 ENCOUNTER — Ambulatory Visit (INDEPENDENT_AMBULATORY_CARE_PROVIDER_SITE_OTHER): Payer: Commercial Managed Care - PPO | Admitting: Internal Medicine

## 2022-09-05 VITALS — BP 120/82 | HR 82 | Temp 98.3°F | Ht 71.0 in | Wt 202.0 lb

## 2022-09-05 DIAGNOSIS — J329 Chronic sinusitis, unspecified: Secondary | ICD-10-CM | POA: Insufficient documentation

## 2022-09-05 DIAGNOSIS — J40 Bronchitis, not specified as acute or chronic: Secondary | ICD-10-CM

## 2022-09-05 DIAGNOSIS — J019 Acute sinusitis, unspecified: Secondary | ICD-10-CM | POA: Insufficient documentation

## 2022-09-05 DIAGNOSIS — J0100 Acute maxillary sinusitis, unspecified: Secondary | ICD-10-CM | POA: Diagnosis not present

## 2022-09-05 DIAGNOSIS — R0789 Other chest pain: Secondary | ICD-10-CM | POA: Diagnosis not present

## 2022-09-05 MED ORDER — METHYLPREDNISOLONE 4 MG PO TBPK: ORAL_TABLET | ORAL | 0 refills | Status: DC

## 2022-09-05 MED ORDER — LEVOFLOXACIN 500 MG PO TABS: 500.0000 mg | ORAL_TABLET | Freq: Every day | ORAL | 0 refills | Status: AC

## 2022-09-05 NOTE — Assessment & Plan Note (Signed)
Not better  - refractory to Augmentin.  Start Levaquin.  Risks and benefits discussed. Start Medrol pack

## 2022-09-05 NOTE — Assessment & Plan Note (Signed)
Refractory to Augmentin.  Start Levaquin.  Risks and benefits discussed.

## 2022-09-05 NOTE — Assessment & Plan Note (Signed)
Coronary calcium CT score is 0 -discussed with the patient

## 2022-09-05 NOTE — Progress Notes (Signed)
Subjective:  Patient ID: Aaron Sharp, male    DOB: 1966-01-13  Age: 57 y.o. MRN: [id number]  CC: Sinus Problem (Sxs of sinus pressure, chest congestion, dry cough, sob and heaviness in chest, ears fell full )   HPI LEODIS DILEONARDO presents for sinusitis x 8 weeks Pt took Augmetin 2 wks ago; a little better...worse now  Outpatient Medications Prior to Visit  Medication Sig Dispense Refill   amoxicillin-clavulanate (AUGMENTIN) 875-125 MG tablet Take 1 tablet by mouth 2 (two) times daily. 20 tablet 0   b complex vitamins capsule Take 1 capsule by mouth daily. 100 capsule 3   Tetrahydrozoline HCl (VISINE OP) Place 1 drop into both eyes daily as needed (redness).     gabapentin (NEURONTIN) 100 MG capsule Take 2 capsules (200 mg total) by mouth at bedtime. (Patient not taking: Reported on 04/17/2022) 180 capsule 0   Vitamin D, Ergocalciferol, (DRISDOL) 1.25 MG (50000 UNIT) CAPS capsule Take 1 capsule (50,000 Units total) by mouth every 7 (seven) days. (Patient not taking: Reported on 04/17/2022) 12 capsule 0   No facility-administered medications prior to visit.    ROS: Review of Systems  Constitutional:  Positive for fatigue. Negative for appetite change and unexpected weight change.  HENT:  Positive for congestion, ear pain, postnasal drip and sinus pressure. Negative for nosebleeds, sneezing, sore throat and trouble swallowing.   Eyes:  Negative for itching and visual disturbance.  Respiratory:  Positive for cough.   Cardiovascular:  Negative for chest pain, palpitations and leg swelling.  Gastrointestinal:  Negative for abdominal distention, blood in stool, diarrhea and nausea.  Genitourinary:  Negative for frequency and hematuria.  Musculoskeletal:  Negative for back pain, gait problem, joint swelling and neck pain.  Skin:  Negative for rash.  Neurological:  Negative for dizziness, tremors, speech difficulty and weakness.  Psychiatric/Behavioral:  Negative for agitation,  dysphoric mood and sleep disturbance. The patient is not nervous/anxious.     Objective:  BP 120/82 (BP Location: Right Arm, Patient Position: Sitting, Cuff Size: Normal)   Pulse 82   Temp 98.3 F (36.8 C) (Oral)   Ht 5\' 11"  (1.803 m)   Wt 202 lb (91.6 kg)   SpO2 98%   BMI 28.17 kg/m   BP Readings from Last 3 Encounters:  09/05/22 120/82  04/17/22 120/72  05/18/21 126/87    Wt Readings from Last 3 Encounters:  09/05/22 202 lb (91.6 kg)  04/17/22 205 lb (93 kg)  05/18/21 195 lb (88.5 kg)    Physical Exam Constitutional:      General: He is not in acute distress.    Appearance: He is well-developed.     Comments: NAD  Eyes:     Conjunctiva/sclera: Conjunctivae normal.     Pupils: Pupils are equal, round, and reactive to light.  Neck:     Thyroid: No thyromegaly.     Vascular: No JVD.  Cardiovascular:     Rate and Rhythm: Normal rate and regular rhythm.     Heart sounds: Normal heart sounds. No murmur heard.    No friction rub. No gallop.  Pulmonary:     Effort: Pulmonary effort is normal. No respiratory distress.     Breath sounds: Normal breath sounds. No wheezing or rales.  Chest:     Chest wall: No tenderness.  Abdominal:     General: Bowel sounds are normal. There is no distension.     Palpations: Abdomen is soft. There is no mass.  Tenderness: There is no abdominal tenderness. There is no guarding or rebound.  Musculoskeletal:        General: No tenderness. Normal range of motion.     Cervical back: Normal range of motion.  Lymphadenopathy:     Cervical: No cervical adenopathy.  Skin:    General: Skin is warm and dry.     Findings: No rash.  Neurological:     Mental Status: He is alert and oriented to person, place, and time.     Cranial Nerves: No cranial nerve deficit.     Motor: No abnormal muscle tone.     Coordination: Coordination normal.     Gait: Gait normal.     Deep Tendon Reflexes: Reflexes are normal and symmetric.  Psychiatric:         Behavior: Behavior normal.        Thought Content: Thought content normal.        Judgment: Judgment normal.    Swelling, erythema of the nares, slight rhonchi bilaterally in the lungs  Lab Results  Component Value Date   WBC 6.2 04/17/2022   HGB 15.7 04/17/2022   HCT 45.2 04/17/2022   PLT 212.0 04/17/2022   GLUCOSE 86 04/17/2022   CHOL 282 (H) 04/17/2022   TRIG (H) 04/17/2022    663.0 Triglyceride is over 400; calculations on Lipids are invalid.   HDL 40.20 04/17/2022   LDLDIRECT 100.0 04/17/2022   LDLCALC 109 (H) 01/09/2018   ALT 31 04/17/2022   AST 22 04/17/2022   NA 137 04/17/2022   K 4.9 04/17/2022   CL 102 04/17/2022   CREATININE 1.17 04/17/2022   BUN 19 04/17/2022   CO2 27 04/17/2022   TSH 4.09 04/17/2022   PSA 2.91 04/17/2022   INR 1.09 11/17/2009    CT CARDIAC SCORING ([facility] LOCATIONS ONLY)  Result Date: 05/15/2022 CLINICAL DATA:  Dyslipidemia * Tracking Code: FCC * EXAM: CT CARDIAC CORONARY ARTERY CALCIUM SCORE TECHNIQUE: Non-contrast imaging through the heart was performed using prospective ECG gating. Image post processing was performed on an independent workstation, allowing for quantitative analysis of the heart and coronary arteries. Note that this exam targets the heart and the chest was not imaged in its entirety. COMPARISON:  None available. FINDINGS: CORONARY CALCIUM SCORES: Left Main: 0 LAD: 0 LCx: 0 RCA: 0 Total Agatston Score: 0 MESA database percentile: 0 AORTA MEASUREMENTS: Ascending Aorta: 30 cm Descending Aorta:26 cm OTHER FINDINGS: Heart is normal size. Aorta normal caliber. No adenopathy. No confluent opacities or effusions. No acute findings in the upper abdomen. Chest wall soft tissues are unremarkable. No acute bony abnormality. IMPRESSION: No visible coronary artery calcifications. Total coronary calcium score of 0. No acute or significant extracardiac abnormality. Electronically Signed   By: Charlett Nose M.D.   On: 05/15/2022 14:28    Assessment  & Plan:   Problem List Items Addressed This Visit     Chest pain, atypical - Primary    Coronary calcium CT score is 0 -discussed with the patient      Acute sinusitis    Refractory to Augmentin.  Start Levaquin.  Risks and benefits discussed.      Relevant Medications   levofloxacin (LEVAQUIN) 500 MG tablet   methylPREDNISolone (MEDROL DOSEPAK) 4 MG TBPK tablet   Bronchitis    Not better  - refractory to Augmentin.  Start Levaquin.  Risks and benefits discussed. Start Medrol pack         Meds ordered this encounter  Medications  levofloxacin (LEVAQUIN) 500 MG tablet    Sig: Take 1 tablet (500 mg total) by mouth daily for 10 days.    Dispense:  10 tablet    Refill:  0   methylPREDNISolone (MEDROL DOSEPAK) 4 MG TBPK tablet    Sig: As directed    Dispense:  21 tablet    Refill:  0      Follow-up: No follow-ups on file.  Sonda Primes, MD

## 2022-09-10 ENCOUNTER — Other Ambulatory Visit: Payer: Self-pay | Admitting: Family Medicine

## 2022-09-10 ENCOUNTER — Encounter: Payer: Self-pay | Admitting: Internal Medicine

## 2022-09-11 ENCOUNTER — Other Ambulatory Visit: Payer: Self-pay | Admitting: Internal Medicine

## 2022-09-11 MED ORDER — ALIGN 4 MG PO CAPS: 1.0000 | ORAL_CAPSULE | Freq: Every day | ORAL | 0 refills | Status: DC

## 2022-12-12 ENCOUNTER — Encounter: Payer: Self-pay | Admitting: Internal Medicine

## 2023-01-02 ENCOUNTER — Ambulatory Visit: Payer: Commercial Managed Care - PPO | Admitting: Internal Medicine

## 2023-01-02 ENCOUNTER — Encounter: Payer: Self-pay | Admitting: Internal Medicine

## 2023-01-02 VITALS — BP 120/70 | HR 65 | Temp 98.2°F | Ht 71.0 in | Wt 202.0 lb

## 2023-01-02 DIAGNOSIS — J320 Chronic maxillary sinusitis: Secondary | ICD-10-CM | POA: Diagnosis not present

## 2023-01-02 MED ORDER — FEXOFENADINE HCL 180 MG PO TABS: 180.0000 mg | ORAL_TABLET | Freq: Every day | ORAL | 2 refills | Status: DC

## 2023-01-02 MED ORDER — AZELASTINE-FLUTICASONE 137-50 MCG/ACT NA SUSP: 1.0000 | NASAL | 5 refills | Status: DC

## 2023-01-02 MED ORDER — MONTELUKAST SODIUM 10 MG PO TABS: 10.0000 mg | ORAL_TABLET | Freq: Every day | ORAL | 3 refills | Status: DC

## 2023-01-02 NOTE — Progress Notes (Signed)
Subjective:  Patient ID: Aaron Sharp, male    DOB: May 20, 1965  Age: 57 y.o. MRN: [id number]  CC: Follow-up (Sinus pressure, sinus headache)   HPI Aaron Sharp presents for refractory sinusitis. Pt had Levaquin and steroids in June In 2 wks - sinusitis sx's x 3 months  Outpatient Medications Prior to Visit  Medication Sig Dispense Refill   amoxicillin-clavulanate (AUGMENTIN) 875-125 MG tablet Take 1 tablet by mouth 2 (two) times daily. (Patient not taking: Reported on 01/02/2023) 20 tablet 0   b complex vitamins capsule Take 1 capsule by mouth daily. 100 capsule 3   gabapentin (NEURONTIN) 100 MG capsule Take 2 capsules (200 mg total) by mouth at bedtime. (Patient not taking: Reported on 04/17/2022) 180 capsule 0   methylPREDNISolone (MEDROL DOSEPAK) 4 MG TBPK tablet As directed (Patient not taking: Reported on 01/02/2023) 21 tablet 0   Probiotic Product (ALIGN) 4 MG CAPS Take 1 capsule (4 mg total) by mouth daily. 30 capsule 0   Tetrahydrozoline HCl (VISINE OP) Place 1 drop into both eyes daily as needed (redness).     Vitamin D, Ergocalciferol, (DRISDOL) 1.25 MG (50000 UNIT) CAPS capsule Take 1 capsule (50,000 Units total) by mouth every 7 (seven) days. (Patient not taking: Reported on 04/17/2022) 12 capsule 0   No facility-administered medications prior to visit.    ROS: Review of Systems  Constitutional:  Positive for fatigue. Negative for appetite change and unexpected weight change.  HENT:  Positive for sinus pain. Negative for congestion, nosebleeds, sneezing, sore throat and trouble swallowing.   Eyes:  Negative for itching and visual disturbance.  Respiratory:  Negative for cough.   Cardiovascular:  Negative for chest pain, palpitations and leg swelling.  Gastrointestinal:  Negative for abdominal distention, blood in stool, diarrhea and nausea.  Genitourinary:  Negative for frequency and hematuria.  Musculoskeletal:  Negative for back pain, gait problem, joint swelling  and neck pain.  Skin:  Negative for rash.  Neurological:  Negative for dizziness, tremors, speech difficulty and weakness.  Psychiatric/Behavioral:  Negative for agitation, dysphoric mood and sleep disturbance. The patient is not nervous/anxious.     Objective:  BP 120/70 (BP Location: Left Arm, Patient Position: Sitting, Cuff Size: Normal)   Pulse 65   Temp 98.2 F (36.8 C) (Oral)   Ht 5\' 11"  (1.803 m)   Wt 202 lb (91.6 kg)   SpO2 97%   BMI 28.17 kg/m   BP Readings from Last 3 Encounters:  01/02/23 120/70  09/05/22 120/82  04/17/22 120/72    Wt Readings from Last 3 Encounters:  01/02/23 202 lb (91.6 kg)  09/05/22 202 lb (91.6 kg)  04/17/22 205 lb (93 kg)    Physical Exam Constitutional:      General: He is not in acute distress.    Appearance: Normal appearance. He is well-developed.     Comments: NAD  Eyes:     Conjunctiva/sclera: Conjunctivae normal.     Pupils: Pupils are equal, round, and reactive to light.  Neck:     Thyroid: No thyromegaly.     Vascular: No JVD.  Cardiovascular:     Rate and Rhythm: Normal rate and regular rhythm.     Heart sounds: Normal heart sounds. No murmur heard.    No friction rub. No gallop.  Pulmonary:     Effort: Pulmonary effort is normal. No respiratory distress.     Breath sounds: Normal breath sounds. No wheezing or rales.  Chest:     Chest  wall: No tenderness.  Abdominal:     General: Bowel sounds are normal. There is no distension.     Palpations: Abdomen is soft. There is no mass.     Tenderness: There is no abdominal tenderness. There is no guarding or rebound.  Musculoskeletal:        General: No tenderness. Normal range of motion.     Cervical back: Normal range of motion.  Lymphadenopathy:     Cervical: No cervical adenopathy.  Skin:    General: Skin is warm and dry.     Findings: No rash.  Neurological:     Mental Status: He is alert and oriented to person, place, and time.     Cranial Nerves: No cranial  nerve deficit.     Motor: No abnormal muscle tone.     Coordination: Coordination normal.     Gait: Gait normal.     Deep Tendon Reflexes: Reflexes are normal and symmetric.  Psychiatric:        Behavior: Behavior normal.        Thought Content: Thought content normal.        Judgment: Judgment normal.     Lab Results  Component Value Date   WBC 6.2 04/17/2022   HGB 15.7 04/17/2022   HCT 45.2 04/17/2022   PLT 212.0 04/17/2022   GLUCOSE 86 04/17/2022   CHOL 282 (H) 04/17/2022   TRIG (H) 04/17/2022    663.0 Triglyceride is over 400; calculations on Lipids are invalid.   HDL 40.20 04/17/2022   LDLDIRECT 100.0 04/17/2022   LDLCALC 109 (H) 01/09/2018   ALT 31 04/17/2022   AST 22 04/17/2022   NA 137 04/17/2022   K 4.9 04/17/2022   CL 102 04/17/2022   CREATININE 1.17 04/17/2022   BUN 19 04/17/2022   CO2 27 04/17/2022   TSH 4.09 04/17/2022   PSA 2.91 04/17/2022   INR 1.09 11/17/2009    CT CARDIAC SCORING ([facility] LOCATIONS ONLY)  Result Date: 05/15/2022 CLINICAL DATA:  Dyslipidemia * Tracking Code: FCC * EXAM: CT CARDIAC CORONARY ARTERY CALCIUM SCORE TECHNIQUE: Non-contrast imaging through the heart was performed using prospective ECG gating. Image post processing was performed on an independent workstation, allowing for quantitative analysis of the heart and coronary arteries. Note that this exam targets the heart and the chest was not imaged in its entirety. COMPARISON:  None available. FINDINGS: CORONARY CALCIUM SCORES: Left Main: 0 LAD: 0 LCx: 0 RCA: 0 Total Agatston Score: 0 MESA database percentile: 0 AORTA MEASUREMENTS: Ascending Aorta: 30 cm Descending Aorta:26 cm OTHER FINDINGS: Heart is normal size. Aorta normal caliber. No adenopathy. No confluent opacities or effusions. No acute findings in the upper abdomen. Chest wall soft tissues are unremarkable. No acute bony abnormality. IMPRESSION: No visible coronary artery calcifications. Total coronary calcium score of 0. No acute or  significant extracardiac abnormality. Electronically Signed   By: Charlett Nose M.D.   On: 05/15/2022 14:28    Assessment & Plan:   Problem List Items Addressed This Visit     Chronic sinusitis - Primary    Chronic x 3 months ?allergic vs other Singulair qd Dymista if covered Sinus CT      Relevant Medications   Azelastine-Fluticasone (DYMISTA) 137-50 MCG/ACT SUSP   fexofenadine (ALLEGRA) 180 MG tablet   Other Relevant Orders   CT SINUS WO CONTRAST      Meds ordered this encounter  Medications   montelukast (SINGULAIR) 10 MG tablet    Sig: Take 1 tablet (  10 mg total) by mouth daily.    Dispense:  90 tablet    Refill:  3   Azelastine-Fluticasone (DYMISTA) 137-50 MCG/ACT SUSP    Sig: Place 1 Act into the nose 1 day or 1 dose.    Dispense:  23 g    Refill:  5   fexofenadine (ALLEGRA) 180 MG tablet    Sig: Take 1 tablet (180 mg total) by mouth daily.    Dispense:  30 tablet    Refill:  2      Follow-up: Return in about 4 weeks (around 01/30/2023) for a follow-up visit.  Sonda Primes, MD

## 2023-01-02 NOTE — Assessment & Plan Note (Signed)
Chronic x 3 months ?allergic vs other Singulair qd Dymista if covered Sinus CT

## 2023-01-10 ENCOUNTER — Other Ambulatory Visit: Payer: Self-pay | Admitting: Internal Medicine

## 2023-01-10 MED ORDER — MECLIZINE HCL 12.5 MG PO TABS: 12.5000 mg | ORAL_TABLET | Freq: Three times a day (TID) | ORAL | 1 refills | Status: AC | PRN

## 2023-01-18 ENCOUNTER — Ambulatory Visit
Admission: RE | Admit: 2023-01-18 | Discharge: 2023-01-18 | Disposition: A | Discharge status: Home / Self Care | Payer: Commercial Managed Care - PPO | Source: Ambulatory Visit | Attending: Internal Medicine | Admitting: Internal Medicine

## 2023-01-18 DIAGNOSIS — J320 Chronic maxillary sinusitis: Secondary | ICD-10-CM

## 2023-04-23 ENCOUNTER — Ambulatory Visit (INDEPENDENT_AMBULATORY_CARE_PROVIDER_SITE_OTHER): Payer: Commercial Managed Care - PPO | Admitting: Internal Medicine

## 2023-04-23 ENCOUNTER — Encounter: Payer: Self-pay | Admitting: Internal Medicine

## 2023-04-23 VITALS — BP 118/82 | HR 56 | Temp 98.6°F | Ht 71.0 in | Wt 206.0 lb

## 2023-04-23 DIAGNOSIS — R42 Dizziness and giddiness: Secondary | ICD-10-CM | POA: Diagnosis not present

## 2023-04-23 DIAGNOSIS — R972 Elevated prostate specific antigen [PSA]: Secondary | ICD-10-CM

## 2023-04-23 DIAGNOSIS — Z13220 Encounter for screening for lipoid disorders: Secondary | ICD-10-CM | POA: Diagnosis not present

## 2023-04-23 DIAGNOSIS — Z0000 Encounter for general adult medical examination without abnormal findings: Secondary | ICD-10-CM | POA: Diagnosis not present

## 2023-04-23 LAB — CBC WITH DIFFERENTIAL/PLATELET
Basophils Absolute: 0 10*3/uL (ref 0.0–0.1)
Basophils Relative: 0.4 % (ref 0.0–3.0)
Eosinophils Absolute: 0.1 10*3/uL (ref 0.0–0.7)
Eosinophils Relative: 1.6 % (ref 0.0–5.0)
HCT: 45.6 % (ref 39.0–52.0)
Hemoglobin: 15.6 g/dL (ref 13.0–17.0)
Lymphocytes Relative: 33 % (ref 12.0–46.0)
Lymphs Abs: 1.9 10*3/uL (ref 0.7–4.0)
MCHC: 34.1 g/dL (ref 30.0–36.0)
MCV: 93.6 fL (ref 78.0–100.0)
Monocytes Absolute: 0.6 10*3/uL (ref 0.1–1.0)
Monocytes Relative: 9.9 % (ref 3.0–12.0)
Neutro Abs: 3.2 10*3/uL (ref 1.4–7.7)
Neutrophils Relative %: 55.1 % (ref 43.0–77.0)
Platelets: 195 10*3/uL (ref 150.0–400.0)
RBC: 4.87 Mil/uL (ref 4.22–5.81)
RDW: 12.8 % (ref 11.5–15.5)
WBC: 5.8 10*3/uL (ref 4.0–10.5)

## 2023-04-23 LAB — COMPREHENSIVE METABOLIC PANEL
ALT: 24 U/L (ref 0–53)
AST: 20 U/L (ref 0–37)
Albumin: 4.8 g/dL (ref 3.5–5.2)
Alkaline Phosphatase: 65 U/L (ref 39–117)
BUN: 18 mg/dL (ref 6–23)
CO2: 28 meq/L (ref 19–32)
Calcium: 9.8 mg/dL (ref 8.4–10.5)
Chloride: 106 meq/L (ref 96–112)
Creatinine, Ser: 1.18 mg/dL (ref 0.40–1.50)
GFR: 69.53 mL/min (ref >60.00)
Glucose, Bld: 98 mg/dL (ref 70–99)
Potassium: 4.8 meq/L (ref 3.5–5.1)
Sodium: 136 meq/L (ref 135–145)
Total Bilirubin: 0.4 mg/dL (ref 0.2–1.2)
Total Protein: 7.5 g/dL (ref 6.0–8.3)

## 2023-04-23 LAB — LIPID PANEL
Cholesterol: 244 mg/dL — ABNORMAL HIGH (ref 0–200)
HDL: 36.6 mg/dL — ABNORMAL LOW (ref >39.00)
NonHDL: 207.49
Total CHOL/HDL Ratio: 7
Triglycerides: 442 mg/dL — ABNORMAL HIGH (ref 0.0–149.0)
VLDL: 88.4 mg/dL — ABNORMAL HIGH (ref 0.0–40.0)

## 2023-04-23 LAB — URINALYSIS, ROUTINE W REFLEX MICROSCOPIC
Bilirubin Urine: NEGATIVE
Hgb urine dipstick: NEGATIVE
Ketones, ur: NEGATIVE
Nitrite: NEGATIVE
RBC / HPF: NONE SEEN (ref 0–?)
Specific Gravity, Urine: 1.025 (ref 1.000–1.030)
Total Protein, Urine: NEGATIVE
Urine Glucose: NEGATIVE
Urobilinogen, UA: 0.2 (ref 0.0–1.0)
pH: 6 (ref 5.0–8.0)

## 2023-04-23 LAB — VITAMIN B12: Vitamin B-12: 309 pg/mL (ref 211–911)

## 2023-04-23 LAB — LDL CHOLESTEROL, DIRECT: Direct LDL: 115 mg/dL

## 2023-04-23 LAB — TSH: TSH: 3.39 u[IU]/mL (ref 0.35–5.50)

## 2023-04-23 LAB — VITAMIN D 25 HYDROXY (VIT D DEFICIENCY, FRACTURES): VITD: 14.92 ng/mL — ABNORMAL LOW (ref 30.00–100.00)

## 2023-04-23 LAB — PSA: PSA: 2.98 ng/mL (ref 0.10–4.00)

## 2023-04-23 MED ORDER — VITAMIN D3 50 MCG (2000 UT) PO CAPS: 2000.0000 [IU] | ORAL_CAPSULE | Freq: Every day | ORAL | Status: AC

## 2023-04-23 MED ORDER — RIZATRIPTAN BENZOATE 10 MG PO TBDP: 10.0000 mg | ORAL_TABLET | ORAL | 3 refills | Status: DC | PRN

## 2023-04-23 NOTE — Assessment & Plan Note (Signed)
Relapsing sx's Meclizine and Ibuprofen Maxat prn

## 2023-04-23 NOTE — Assessment & Plan Note (Addendum)
  We discussed age appropriate health related issues, including available/recomended screening tests and vaccinations. Labs were ordered to be later reviewed . All questions were answered. We discussed one or more of the following - seat belt use, use of sunscreen/sun exposure exercise, fall risk reduction, second hand smoke exposure, firearm use and storage, seat belt use, a need for adhering to healthy diet and exercise. Labs were ordered.  All questions were answered. Colon/EGD at [facility] due in 2025 Yearly skin exams by Derm

## 2023-04-23 NOTE — Assessment & Plan Note (Signed)
Check PSA. ?

## 2023-04-23 NOTE — Progress Notes (Signed)
Subjective:  Patient ID: DARIEON SCHAAL, male    DOB: 1966-01-15  Age: 58 y.o. MRN: [id number]  CC: Annual Exam   HPI Aaron Sharp presents for a well exam  Outpatient Medications Prior to Visit  Medication Sig Dispense Refill   b complex vitamins capsule Take 1 capsule by mouth daily. 100 capsule 3   meclizine (ANTIVERT) 12.5 MG tablet Take 1 tablet (12.5 mg total) by mouth 3 (three) times daily as needed for dizziness. 30 tablet 1   amoxicillin-clavulanate (AUGMENTIN) 875-125 MG tablet Take 1 tablet by mouth 2 (two) times daily. (Patient not taking: Reported on 01/02/2023) 20 tablet 0   Azelastine-Fluticasone (DYMISTA) 137-50 MCG/ACT SUSP Place 1 Act into the nose 1 day or 1 dose. 23 g 5   fexofenadine (ALLEGRA) 180 MG tablet Take 1 tablet (180 mg total) by mouth daily. 30 tablet 2   gabapentin (NEURONTIN) 100 MG capsule Take 2 capsules (200 mg total) by mouth at bedtime. (Patient not taking: Reported on 04/17/2022) 180 capsule 0   methylPREDNISolone (MEDROL DOSEPAK) 4 MG TBPK tablet As directed (Patient not taking: Reported on 01/02/2023) 21 tablet 0   montelukast (SINGULAIR) 10 MG tablet Take 1 tablet (10 mg total) by mouth daily. 90 tablet 3   Probiotic Product (ALIGN) 4 MG CAPS Take 1 capsule (4 mg total) by mouth daily. 30 capsule 0   Tetrahydrozoline HCl (VISINE OP) Place 1 drop into both eyes daily as needed (redness).     Vitamin D, Ergocalciferol, (DRISDOL) 1.25 MG (50000 UNIT) CAPS capsule Take 1 capsule (50,000 Units total) by mouth every 7 (seven) days. (Patient not taking: Reported on 04/17/2022) 12 capsule 0   No facility-administered medications prior to visit.    ROS: Review of Systems  Constitutional:  Negative for appetite change, fatigue and unexpected weight change.  HENT:  Negative for congestion, nosebleeds, sneezing, sore throat and trouble swallowing.   Eyes:  Negative for itching and visual disturbance.  Respiratory:  Negative for cough.    Cardiovascular:  Negative for chest pain, palpitations and leg swelling.  Gastrointestinal:  Negative for abdominal distention, blood in stool, diarrhea and nausea.  Genitourinary:  Negative for frequency and hematuria.  Musculoskeletal:  Negative for back pain, gait problem, joint swelling and neck pain.  Skin:  Negative for rash.  Neurological:  Positive for dizziness and headaches. Negative for tremors, speech difficulty and weakness.  Psychiatric/Behavioral:  Negative for agitation, dysphoric mood, sleep disturbance and suicidal ideas. The patient is not nervous/anxious.     Objective:  BP 118/82 (BP Location: Left Arm, Patient Position: Sitting, Cuff Size: Normal)   Pulse (!) 56   Temp 98.6 F (37 C) (Oral)   Ht 5\' 11"  (1.803 m)   Wt 206 lb (93.4 kg)   SpO2 97%   BMI 28.73 kg/m   BP Readings from Last 3 Encounters:  04/23/23 118/82  01/02/23 120/70  09/05/22 120/82    Wt Readings from Last 3 Encounters:  04/23/23 206 lb (93.4 kg)  01/02/23 202 lb (91.6 kg)  09/05/22 202 lb (91.6 kg)    Physical Exam Constitutional:      General: He is not in acute distress.    Appearance: Normal appearance. He is well-developed.     Comments: NAD  Eyes:     Conjunctiva/sclera: Conjunctivae normal.     Pupils: Pupils are equal, round, and reactive to light.  Neck:     Thyroid: No thyromegaly.     Vascular: No JVD.  Cardiovascular:     Rate and Rhythm: Normal rate and regular rhythm.     Heart sounds: Normal heart sounds. No murmur heard.    No friction rub. No gallop.  Pulmonary:     Effort: Pulmonary effort is normal. No respiratory distress.     Breath sounds: Normal breath sounds. No wheezing or rales.  Chest:     Chest wall: No tenderness.  Abdominal:     General: Bowel sounds are normal. There is no distension.     Palpations: Abdomen is soft. There is no mass.     Tenderness: There is no abdominal tenderness. There is no guarding or rebound.  Musculoskeletal:         General: No tenderness. Normal range of motion.     Cervical back: Normal range of motion.  Lymphadenopathy:     Cervical: No cervical adenopathy.  Skin:    General: Skin is warm and dry.     Findings: No rash.  Neurological:     Mental Status: He is alert and oriented to person, place, and time.     Cranial Nerves: No cranial nerve deficit.     Motor: No abnormal muscle tone.     Coordination: Coordination normal.     Gait: Gait normal.     Deep Tendon Reflexes: Reflexes are normal and symmetric.  Psychiatric:        Behavior: Behavior normal.        Thought Content: Thought content normal.        Judgment: Judgment normal.   Rectal - Colon/EGD at [facility] this year  Lab Results  Component Value Date   WBC 6.2 04/17/2022   HGB 15.7 04/17/2022   HCT 45.2 04/17/2022   PLT 212.0 04/17/2022   GLUCOSE 86 04/17/2022   CHOL 282 (H) 04/17/2022   TRIG (H) 04/17/2022    663.0 Triglyceride is over 400; calculations on Lipids are invalid.   HDL 40.20 04/17/2022   LDLDIRECT 100.0 04/17/2022   LDLCALC 109 (H) 01/09/2018   ALT 31 04/17/2022   AST 22 04/17/2022   NA 137 04/17/2022   K 4.9 04/17/2022   CL 102 04/17/2022   CREATININE 1.17 04/17/2022   BUN 19 04/17/2022   CO2 27 04/17/2022   TSH 4.09 04/17/2022   PSA 2.91 04/17/2022   INR 1.09 11/17/2009    CT SINUS WO CONTRAST Result Date: 02/13/2023 CLINICAL DATA:  Refractory sinusitis for 3 months. EXAM: CT MAXILLOFACIAL WITHOUT CONTRAST TECHNIQUE: Multidetector CT images of the paranasal sinuses were obtained using the standard protocol without intravenous contrast. RADIATION DOSE REDUCTION: This exam was performed according to the departmental dose-optimization program which includes automated exposure control, adjustment of the mA and/or kV according to patient size and/or use of iterative reconstruction technique. COMPARISON:  None Available. FINDINGS: Paranasal sinuses: Frontal: Mild mucosal thickening especially at the left  frontal ethmoidal recess. No bony outflow obstruction Ethmoid: Mild mucosal thickening asymmetric to the left. Maxillary: Trace mucosal thickening especially along the floor the left maxillary sinus. Sphenoid: Mild mucosal thickening along the anterior walls of the sphenoid sinuses. Neither ostium is visible. The sphenoid ethmoidal recesses are patent. Right ostiomeatal unit: Patent. Left ostiomeatal unit: Mucosal density effaces the infundibulum. No bony outflow stenosis. Nasal passages: Patent. Intact nasal septum is essentially midline. Other: No incidental finding on soft tissue windows. IMPRESSION: Overall mild sinus inflammation somewhat asymmetric to the left where mucosal thickening effaces the maxillary infundibulum. Patent nasal cavity with essentially midline septum. Electronically Signed  By: Tiburcio Pea M.D.   On: 02/13/2023 07:20    Assessment & Plan:   Problem List Items Addressed This Visit     PSA, INCREASED   Check PSA      Well adult exam - Primary    We discussed age appropriate health related issues, including available/recomended screening tests and vaccinations. Labs were ordered to be later reviewed . All questions were answered. We discussed one or more of the following - seat belt use, use of sunscreen/sun exposure exercise, fall risk reduction, second hand smoke exposure, firearm use and storage, seat belt use, a need for adhering to healthy diet and exercise. Labs were ordered.  All questions were answered. Colon/EGD at [facility] due in 2025 Yearly skin exams by Derm      Relevant Orders   TSH   Urinalysis   CBC with Differential/Platelet   Lipid panel   PSA   Comprehensive metabolic panel   Vitamin B12   VITAMIN D 25 Hydroxy (Vit-D Deficiency, Fractures)   Vertigo   Relapsing sx's Meclizine and Ibuprofen Maxat prn      Relevant Orders   Vitamin B12   VITAMIN D 25 Hydroxy (Vit-D Deficiency, Fractures)      Meds ordered this encounter  Medications    rizatriptan (MAXALT-MLT) 10 MG disintegrating tablet    Sig: Take 1 tablet (10 mg total) by mouth as needed for migraine. May repeat in 2 hours if needed    Dispense:  10 tablet    Refill:  3   Cholecalciferol (VITAMIN D3) 50 MCG (2000 UT) capsule    Sig: Take 1 capsule (2,000 Units total) by mouth daily.      Follow-up: Return in about 1 year (around 04/22/2024) for a follow-up visit.  Sonda Primes, MD

## 2023-04-28 ENCOUNTER — Encounter: Payer: Self-pay | Admitting: Internal Medicine

## 2023-04-28 ENCOUNTER — Other Ambulatory Visit: Payer: Self-pay | Admitting: Internal Medicine

## 2023-04-28 DIAGNOSIS — E785 Hyperlipidemia, unspecified: Secondary | ICD-10-CM | POA: Insufficient documentation

## 2023-04-28 MED ORDER — VITAMIN D3 50 MCG (2000 UT) PO CAPS: 2000.0000 [IU] | ORAL_CAPSULE | Freq: Every day | ORAL | 3 refills | Status: AC

## 2023-04-28 MED ORDER — VITAMIN D (ERGOCALCIFEROL) 1.25 MG (50000 UNIT) PO CAPS: 50000.0000 [IU] | ORAL_CAPSULE | ORAL | 0 refills | Status: AC

## 2023-04-28 MED ORDER — ICOSAPENT ETHYL 1 G PO CAPS: 2.0000 g | ORAL_CAPSULE | Freq: Two times a day (BID) | ORAL | 11 refills | Status: AC

## 2023-06-24 ENCOUNTER — Other Ambulatory Visit: Payer: Self-pay | Admitting: Internal Medicine

## 2024-01-09 ENCOUNTER — Encounter: Payer: Self-pay | Admitting: Internal Medicine

## 2024-01-09 ENCOUNTER — Ambulatory Visit: Admitting: Internal Medicine

## 2024-01-09 VITALS — BP 110/80 | HR 67 | Temp 98.6°F | Ht 72.0 in | Wt 208.4 lb

## 2024-01-09 DIAGNOSIS — G43109 Migraine with aura, not intractable, without status migrainosus: Secondary | ICD-10-CM

## 2024-01-09 DIAGNOSIS — H9313 Tinnitus, bilateral: Secondary | ICD-10-CM

## 2024-01-09 DIAGNOSIS — R42 Dizziness and giddiness: Secondary | ICD-10-CM

## 2024-01-09 MED ORDER — NURTEC 75 MG PO TBDP: ORAL_TABLET | ORAL | 3 refills | Status: AC

## 2024-01-09 MED ORDER — BUTALBITAL-ACETAMINOPHEN 50-300 MG PO TABS: 1.0000 | ORAL_TABLET | Freq: Two times a day (BID) | ORAL | 2 refills | Status: AC | PRN

## 2024-01-09 MED ORDER — CELECOXIB 200 MG PO CAPS: 200.0000 mg | ORAL_CAPSULE | Freq: Two times a day (BID) | ORAL | 1 refills | Status: AC

## 2024-01-09 NOTE — Patient Instructions (Signed)

## 2024-01-09 NOTE — Progress Notes (Signed)
 Subjective:  Patient ID: Aaron Sharp, male    DOB: 1965/08/24  Age: 58 y.o. MRN: [id number]  CC: Dizziness (Started about three weeks. States it is every day. )   HPI Aaron Sharp presents for  vertigo. He was down for 2 days Moved to [facility] 5 weeks ago C/o dizziness, HAs, migraines  Outpatient Medications Prior to Visit  Medication Sig Dispense Refill   b complex vitamins capsule Take 1 capsule by mouth daily. 100 capsule 3   Cholecalciferol (VITAMIN D3) 50 MCG (2000 UT) capsule Take 1 capsule (2,000 Units total) by mouth daily.     Cholecalciferol (VITAMIN D3) 50 MCG (2000 UT) capsule Take 1 capsule (2,000 Units total) by mouth daily. 100 capsule 3   meclizine  (ANTIVERT ) 12.5 MG tablet Take 1 tablet (12.5 mg total) by mouth 3 (three) times daily as needed for dizziness. 30 tablet 1   icosapent  Ethyl (VASCEPA ) 1 g capsule Take 2 capsules (2 g total) by mouth 2 (two) times daily. (Patient not taking: Reported on 01/09/2024) 120 capsule 11   Vitamin D , Ergocalciferol , (DRISDOL ) 1.25 MG (50000 UNIT) CAPS capsule Take 1 capsule (50,000 Units total) by mouth every 7 (seven) days. (Patient not taking: Reported on 01/09/2024) 8 capsule 0   rizatriptan  (MAXALT -MLT) 10 MG disintegrating tablet Take 1 tablet (10 mg total) by mouth as needed for migraine. May repeat in 2 hours if needed (Patient not taking: Reported on 01/09/2024) 10 tablet 3   No facility-administered medications prior to visit.    ROS: Review of Systems  Constitutional:  Negative for appetite change, fatigue and unexpected weight change.  HENT:  Negative for congestion, nosebleeds, sneezing, sore throat and trouble swallowing.   Eyes:  Negative for itching and visual disturbance.  Respiratory:  Negative for cough.   Cardiovascular:  Negative for chest pain, palpitations and leg swelling.  Gastrointestinal:  Negative for abdominal distention, blood in stool, diarrhea and nausea.  Genitourinary:  Negative for  frequency and hematuria.  Musculoskeletal:  Negative for back pain, gait problem, joint swelling and neck pain.  Skin:  Negative for rash.  Neurological:  Positive for dizziness and headaches. Negative for tremors, facial asymmetry, speech difficulty and weakness.  Psychiatric/Behavioral:  Negative for agitation, dysphoric mood and sleep disturbance. The patient is not nervous/anxious.     Objective:  BP 110/80   Pulse 67   Temp 98.6 F (37 C) (Oral)   Ht 6' (1.829 m)   Wt 208 lb 6.4 oz (94.5 kg)   SpO2 98%   BMI 28.26 kg/m   BP Readings from Last 3 Encounters:  01/09/24 110/80  04/23/23 118/82  01/02/23 120/70    Wt Readings from Last 3 Encounters:  01/09/24 208 lb 6.4 oz (94.5 kg)  04/23/23 206 lb (93.4 kg)  01/02/23 202 lb (91.6 kg)    Physical Exam Constitutional:      General: He is not in acute distress.    Appearance: Normal appearance. He is well-developed.     Comments: NAD  Eyes:     Conjunctiva/sclera: Conjunctivae normal.     Pupils: Pupils are equal, round, and reactive to light.  Neck:     Thyroid : No thyromegaly.     Vascular: No JVD.  Cardiovascular:     Rate and Rhythm: Normal rate and regular rhythm.     Heart sounds: Normal heart sounds. No murmur heard.    No friction rub. No gallop.  Pulmonary:     Effort: Pulmonary effort  is normal. No respiratory distress.     Breath sounds: Normal breath sounds. No wheezing or rales.  Chest:     Chest wall: No tenderness.  Abdominal:     General: Bowel sounds are normal. There is no distension.     Palpations: Abdomen is soft. There is no mass.     Tenderness: There is no abdominal tenderness. There is no guarding or rebound.  Musculoskeletal:        General: No tenderness. Normal range of motion.     Cervical back: Normal range of motion.  Lymphadenopathy:     Cervical: No cervical adenopathy.  Skin:    General: Skin is warm and dry.     Findings: No rash.  Neurological:     Mental Status: He is  alert and oriented to person, place, and time.     Cranial Nerves: No cranial nerve deficit.     Motor: No abnormal muscle tone.     Coordination: Coordination normal.     Gait: Gait normal.     Deep Tendon Reflexes: Reflexes are normal and symmetric.  Psychiatric:        Behavior: Behavior normal.        Thought Content: Thought content normal.        Judgment: Judgment normal.   Hallpike maneuver is negative bilaterally  Lab Results  Component Value Date   WBC 5.8 04/23/2023   HGB 15.6 04/23/2023   HCT 45.6 04/23/2023   PLT 195.0 04/23/2023   GLUCOSE 98 04/23/2023   CHOL 244 (H) 04/23/2023   TRIG (H) 04/23/2023    442.0 Triglyceride is over 400; calculations on Lipids are invalid.   HDL 36.60 (L) 04/23/2023   LDLDIRECT 115.0 04/23/2023   LDLCALC 109 (H) 01/09/2018   ALT 24 04/23/2023   AST 20 04/23/2023   NA 136 04/23/2023   K 4.8 04/23/2023   CL 106 04/23/2023   CREATININE 1.18 04/23/2023   BUN 18 04/23/2023   CO2 28 04/23/2023   TSH 3.39 04/23/2023   PSA 2.98 04/23/2023   INR 1.09 11/17/2009    CT SINUS WO CONTRAST Result Date: 02/13/2023 CLINICAL DATA:  Refractory sinusitis for 3 months. EXAM: CT MAXILLOFACIAL WITHOUT CONTRAST TECHNIQUE: Multidetector CT images of the paranasal sinuses were obtained using the standard protocol without intravenous contrast. RADIATION DOSE REDUCTION: This exam was performed according to the departmental dose-optimization program which includes automated exposure control, adjustment of the mA and/or kV according to patient size and/or use of iterative reconstruction technique. COMPARISON:  None Available. FINDINGS: Paranasal sinuses: Frontal: Mild mucosal thickening especially at the left frontal ethmoidal recess. No bony outflow obstruction Ethmoid: Mild mucosal thickening asymmetric to the left. Maxillary: Trace mucosal thickening especially along the floor the left maxillary sinus. Sphenoid: Mild mucosal thickening along the anterior  walls of the sphenoid sinuses. Neither ostium is visible. The sphenoid ethmoidal recesses are patent. Right ostiomeatal unit: Patent. Left ostiomeatal unit: Mucosal density effaces the infundibulum. No bony outflow stenosis. Nasal passages: Patent. Intact nasal septum is essentially midline. Other: No incidental finding on soft tissue windows. IMPRESSION: Overall mild sinus inflammation somewhat asymmetric to the left where mucosal thickening effaces the maxillary infundibulum. Patent nasal cavity with essentially midline septum. Electronically Signed   By: Dorn Roulette M.D.   On: 02/13/2023 07:20    Assessment & Plan:   Problem List Items Addressed This Visit     Migraine headache with aura   Likely Chyrl is suffering with vertiginous migraine headaches  that he did before in the past.  Prescribed Nurtec 75 mg every other day as needed, Celebrex 200 mg as needed Bupap as needed.  Risks and benefits discussed.      Relevant Medications   Rimegepant Sulfate (NURTEC) 75 MG TBDP   celecoxib (CELEBREX) 200 MG capsule   Butalbital-Acetaminophen  (BUPAP) 50-300 MG TABS   Tinnitus   Recurrent.  ENT consultation.      Relevant Orders   Ambulatory referral to ENT   Vertigo - Primary   Tinnitus, headaches -relapsing.  There is no hearing loss.  ENT consultation to rule out Mnire's etc.      Relevant Orders   Ambulatory referral to ENT      Meds ordered this encounter  Medications   Rimegepant Sulfate (NURTEC) 75 MG TBDP    Sig: 1 po qod prn    Dispense:  12 tablet    Refill:  3   celecoxib (CELEBREX) 200 MG capsule    Sig: Take 1 capsule (200 mg total) by mouth 2 (two) times daily.    Dispense:  60 capsule    Refill:  1   Butalbital-Acetaminophen  (BUPAP) 50-300 MG TABS    Sig: Take 1 tablet by mouth 2 (two) times daily as needed (bad headacehe).    Dispense:  60 tablet    Refill:  2      Follow-up: Return in about 3 months (around 04/10/2024) for a follow-up visit.  Marolyn Noel, MD

## 2024-01-09 NOTE — Assessment & Plan Note (Addendum)
 Tinnitus, headaches -relapsing.  There is no hearing loss.  ENT consultation to rule out Mnire's etc.

## 2024-01-20 DIAGNOSIS — H9319 Tinnitus, unspecified ear: Secondary | ICD-10-CM | POA: Insufficient documentation

## 2024-01-20 NOTE — Assessment & Plan Note (Signed)
 Recurrent.  ENT consultation.

## 2024-01-20 NOTE — Assessment & Plan Note (Signed)
 Aaron Sharp is suffering with vertiginous migraine headaches that he did before in the past.  Prescribed Nurtec 75 mg every other day as needed, Celebrex 200 mg as needed Bupap as needed.  Risks and benefits discussed.
